# Patient Record
Sex: Male | Born: 1955 | Race: White | Hispanic: No | Marital: Married | Smoking: Former smoker
Health system: Southern US, Community
[De-identification: ages and names within clinical notes are randomized; demographics above are authoritative.]

## PROBLEM LIST (undated history)

## (undated) DIAGNOSIS — J309 Allergic rhinitis, unspecified: Secondary | ICD-10-CM

## (undated) DIAGNOSIS — R0600 Dyspnea, unspecified: Secondary | ICD-10-CM

## (undated) DIAGNOSIS — D509 Iron deficiency anemia, unspecified: Secondary | ICD-10-CM

## (undated) DIAGNOSIS — N189 Chronic kidney disease, unspecified: Secondary | ICD-10-CM

## (undated) DIAGNOSIS — K219 Gastro-esophageal reflux disease without esophagitis: Secondary | ICD-10-CM

## (undated) DIAGNOSIS — E039 Hypothyroidism, unspecified: Secondary | ICD-10-CM

## (undated) DIAGNOSIS — R0609 Other forms of dyspnea: Secondary | ICD-10-CM

## (undated) DIAGNOSIS — G4730 Sleep apnea, unspecified: Secondary | ICD-10-CM

## (undated) DIAGNOSIS — K279 Peptic ulcer, site unspecified, unspecified as acute or chronic, without hemorrhage or perforation: Secondary | ICD-10-CM

## (undated) DIAGNOSIS — Z5189 Encounter for other specified aftercare: Secondary | ICD-10-CM

## (undated) DIAGNOSIS — G4733 Obstructive sleep apnea (adult) (pediatric): Secondary | ICD-10-CM

## (undated) DIAGNOSIS — E119 Type 2 diabetes mellitus without complications: Secondary | ICD-10-CM

## (undated) DIAGNOSIS — M1990 Unspecified osteoarthritis, unspecified site: Secondary | ICD-10-CM

## (undated) DIAGNOSIS — E785 Hyperlipidemia, unspecified: Secondary | ICD-10-CM

## (undated) DIAGNOSIS — Z8719 Personal history of other diseases of the digestive system: Secondary | ICD-10-CM

## (undated) DIAGNOSIS — I10 Essential (primary) hypertension: Secondary | ICD-10-CM

## (undated) DIAGNOSIS — H269 Unspecified cataract: Secondary | ICD-10-CM

## (undated) HISTORY — DX: Type 2 diabetes mellitus without complications: E11.9

## (undated) HISTORY — DX: Hyperlipidemia, unspecified: E78.5

## (undated) HISTORY — PX: OTHER SURGICAL HISTORY: SHX169

## (undated) HISTORY — PX: GASTRIC RESTRICTION SURGERY: SHX653

## (undated) HISTORY — DX: Unspecified cataract: H26.9

## (undated) HISTORY — DX: Sleep apnea, unspecified: G47.30

## (undated) HISTORY — DX: Chronic kidney disease, unspecified: N18.9

## (undated) HISTORY — DX: Dyspnea, unspecified: R06.00

## (undated) HISTORY — DX: Peptic ulcer, site unspecified, unspecified as acute or chronic, without hemorrhage or perforation: K27.9

## (undated) HISTORY — DX: Encounter for other specified aftercare: Z51.89

## (undated) HISTORY — DX: Obstructive sleep apnea (adult) (pediatric): G47.33

## (undated) HISTORY — DX: Unspecified osteoarthritis, unspecified site: M19.90

## (undated) HISTORY — DX: Iron deficiency anemia, unspecified: D50.9

## (undated) HISTORY — DX: Essential (primary) hypertension: I10

## (undated) HISTORY — DX: Allergic rhinitis, unspecified: J30.9

## (undated) HISTORY — PX: COLONOSCOPY: SHX174

## (undated) HISTORY — DX: Other forms of dyspnea: R06.09

---

## 1981-07-18 HISTORY — PX: GASTRIC BYPASS: SHX52

## 1996-07-18 HISTORY — PX: CHOLECYSTECTOMY: SHX55

## 1998-02-04 ENCOUNTER — Encounter: Admission: RE | Admit: 1998-02-04 | Discharge: 1998-05-05 | Payer: Self-pay | Admitting: Cardiology

## 2003-06-26 ENCOUNTER — Encounter: Admission: RE | Admit: 2003-06-26 | Discharge: 2003-06-26 | Payer: Self-pay | Admitting: Internal Medicine

## 2003-07-06 ENCOUNTER — Ambulatory Visit (HOSPITAL_BASED_OUTPATIENT_CLINIC_OR_DEPARTMENT_OTHER): Admission: RE | Admit: 2003-07-06 | Discharge: 2003-07-06 | Payer: Self-pay | Admitting: Interventional Cardiology

## 2003-10-17 ENCOUNTER — Ambulatory Visit (HOSPITAL_COMMUNITY): Admission: RE | Admit: 2003-10-17 | Discharge: 2003-10-17 | Payer: Self-pay | Admitting: Neurosurgery

## 2003-10-30 ENCOUNTER — Ambulatory Visit (HOSPITAL_COMMUNITY): Admission: RE | Admit: 2003-10-30 | Discharge: 2003-10-30 | Payer: Self-pay | Admitting: Neurosurgery

## 2008-08-11 ENCOUNTER — Encounter: Payer: Self-pay | Admitting: Gastroenterology

## 2008-08-18 ENCOUNTER — Encounter: Payer: Self-pay | Admitting: Gastroenterology

## 2008-08-20 ENCOUNTER — Ambulatory Visit: Payer: Self-pay | Admitting: Gastroenterology

## 2008-08-20 DIAGNOSIS — K921 Melena: Secondary | ICD-10-CM

## 2008-08-20 DIAGNOSIS — D500 Iron deficiency anemia secondary to blood loss (chronic): Secondary | ICD-10-CM

## 2008-08-20 DIAGNOSIS — K219 Gastro-esophageal reflux disease without esophagitis: Secondary | ICD-10-CM | POA: Insufficient documentation

## 2008-08-25 ENCOUNTER — Telehealth: Payer: Self-pay | Admitting: Gastroenterology

## 2008-09-29 ENCOUNTER — Encounter: Payer: Self-pay | Admitting: Gastroenterology

## 2008-09-29 ENCOUNTER — Ambulatory Visit (HOSPITAL_COMMUNITY): Admission: RE | Admit: 2008-09-29 | Discharge: 2008-09-29 | Payer: Self-pay | Admitting: Gastroenterology

## 2008-09-29 ENCOUNTER — Ambulatory Visit: Payer: Self-pay | Admitting: Gastroenterology

## 2008-09-29 ENCOUNTER — Telehealth: Payer: Self-pay | Admitting: Gastroenterology

## 2008-09-29 DIAGNOSIS — K449 Diaphragmatic hernia without obstruction or gangrene: Secondary | ICD-10-CM | POA: Insufficient documentation

## 2008-09-30 ENCOUNTER — Encounter: Payer: Self-pay | Admitting: Gastroenterology

## 2008-10-03 ENCOUNTER — Encounter: Admission: RE | Admit: 2008-10-03 | Discharge: 2008-10-03 | Payer: Self-pay | Admitting: Neurosurgery

## 2008-10-09 ENCOUNTER — Telehealth: Payer: Self-pay | Admitting: Gastroenterology

## 2008-11-13 ENCOUNTER — Inpatient Hospital Stay (HOSPITAL_COMMUNITY): Admission: RE | Admit: 2008-11-13 | Discharge: 2008-11-14 | Payer: Self-pay | Admitting: General Surgery

## 2008-11-15 HISTORY — PX: BACK SURGERY: SHX140

## 2010-08-08 ENCOUNTER — Encounter: Payer: Self-pay | Admitting: Neurosurgery

## 2010-10-27 LAB — GLUCOSE, CAPILLARY
Glucose-Capillary: 128 mg/dL — ABNORMAL HIGH (ref 70–99)
Glucose-Capillary: 175 mg/dL — ABNORMAL HIGH (ref 70–99)
Glucose-Capillary: 204 mg/dL — ABNORMAL HIGH (ref 70–99)
Glucose-Capillary: 213 mg/dL — ABNORMAL HIGH (ref 70–99)
Glucose-Capillary: 259 mg/dL — ABNORMAL HIGH (ref 70–99)
Glucose-Capillary: 316 mg/dL — ABNORMAL HIGH (ref 70–99)

## 2010-10-27 LAB — BASIC METABOLIC PANEL
BUN: 21 mg/dL (ref 6–23)
CO2: 27 mEq/L (ref 19–32)
Calcium: 9.3 mg/dL (ref 8.4–10.5)
Chloride: 106 mEq/L (ref 96–112)
Creatinine, Ser: 1.63 mg/dL — ABNORMAL HIGH (ref 0.4–1.5)
GFR calc Af Amer: 54 mL/min — ABNORMAL LOW (ref >60)
GFR calc non Af Amer: 44 mL/min — ABNORMAL LOW (ref >60)
Glucose, Bld: 156 mg/dL — ABNORMAL HIGH (ref 70–99)
Potassium: 4.5 mEq/L (ref 3.5–5.1)
Sodium: 138 mEq/L (ref 135–145)

## 2010-10-27 LAB — CBC
HCT: 36 % — ABNORMAL LOW (ref 39.0–52.0)
Hemoglobin: 11.7 g/dL — ABNORMAL LOW (ref 13.0–17.0)
MCHC: 32.4 g/dL (ref 30.0–36.0)
MCV: 78.2 fL (ref 78.0–100.0)
Platelets: 192 10*3/uL (ref 150–400)
RBC: 4.61 MIL/uL (ref 4.22–5.81)
RDW: 17.1 % — ABNORMAL HIGH (ref 11.5–15.5)
WBC: 6.8 10*3/uL (ref 4.0–10.5)

## 2010-10-28 LAB — GLUCOSE, CAPILLARY: Glucose-Capillary: 117 mg/dL — ABNORMAL HIGH (ref 70–99)

## 2010-11-21 ENCOUNTER — Emergency Department (HOSPITAL_COMMUNITY)
Admission: EM | Admit: 2010-11-21 | Discharge: 2010-11-21 | Disposition: A | Discharge status: Home / Self Care | Payer: PRIVATE HEALTH INSURANCE | Attending: Emergency Medicine | Admitting: Emergency Medicine

## 2010-11-21 DIAGNOSIS — W11XXXA Fall on and from ladder, initial encounter: Secondary | ICD-10-CM | POA: Insufficient documentation

## 2010-11-21 DIAGNOSIS — E039 Hypothyroidism, unspecified: Secondary | ICD-10-CM | POA: Insufficient documentation

## 2010-11-21 DIAGNOSIS — Z043 Encounter for examination and observation following other accident: Secondary | ICD-10-CM | POA: Insufficient documentation

## 2010-11-21 DIAGNOSIS — E119 Type 2 diabetes mellitus without complications: Secondary | ICD-10-CM | POA: Insufficient documentation

## 2010-11-21 DIAGNOSIS — I10 Essential (primary) hypertension: Secondary | ICD-10-CM | POA: Insufficient documentation

## 2010-11-30 NOTE — Op Note (Signed)
Evan Berry              ACCOUNT NO.:  [id number]   MEDICAL RECORD NO.:  [id number]          PATIENT TYPE:  INP   LOCATION:  3111                         FACILITY:  [facility]   PHYSICIAN:  Danae Orleans. Venetia Maxon, M.D.  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   POSTOPERATIVE DIAGNOSES:  Herniated lumbar disk at L3-4 with  spondylosis, degenerative disk disease, radiculopathy, and morbid  obesity.   PROCEDURE:  Right L3-4 microdiskectomy with microdissection.   SURGEON:  Danae Orleans. Venetia Maxon, MD   ASSISTANT:  1. Georgiann Cocker, RN  2. Hilda Lias, MD   ANESTHESIA:  General endotracheal anesthesia.   ESTIMATED BLOOD LOSS:  400 mL.   COMPLICATIONS:  None.   DISPOSITION:  Recovery.   INDICATIONS:  Evan Berry is a 55 year old morbidly obese man  (greater than 400 pounds) with a free fragment of disk herniation at L3-  4 on the right with significant right L4 radiculopathy and low back  pain.  It was elected to take him to the Surgery for microdiskectomy to  the affected level.   PROCEDURE:  Evan Berry was brought to the operating room.  Following  satisfactory and uncomplicated induction of general endotracheal  anesthesia and placement of intravenous lines, the patient was placed in  a prone position on the Wilson frame.  His back was prepped and draped  in the usual sterile fashion.  Prior to draping, an 18 gauge spinal  needle was inserted until what was felt to be the L3-4 level and  intraoperative x-rays were obtained.  Subsequently, area of planned of  incision was marked, infiltrated with local lidocaine and incision was  made and carried through approximately 6 inches of adipose tissue to the  lumbodorsal fascia which was incised in the right  side of midline.  Subperiosteal dissection was performed exposing the interlaminar space  of the L3-4 level and self-retaining Versatrac retractor with 100-mm  blades were placed to facilitate exposure.  Intraoperative x-ray  confirmed correct orientation at the L3-4 level on the right.  Subsequently, a hemi-semi-laminectomy of L3 was performed and  foraminotomy was performed overlying the superior aspect of L4.  Microscope was brought into field using microdissection technique.  The  thecal sac was mobilized medially.  The L4 nerve root was identified.  Microdissection was used to mobilize the L4 nerve root and a herniated  disk was identified with multiple fragments of free disk material were  removed.  The epidural veins were cauterized with bipolar  electrocautery.  The L3-4 interspace space itself was inspected and  found to be quite spondylitic.  The nerve root was felt to be well  decompressed as was the L3 nerve root.  Medial aspect of the canal was  also decompressed and not appear to have any residual compression.  Hemostasis was assured with Gelfoam soaked in thrombin.  The operative  site was bathed in Depo-Medrol and fentanyl.  The self-retaining  retractor was removed.  The lumbodorsal fascia was closed with 0 Vicryl  sutures, subcutaneous tissues were reapproximated with 2-0 Vicryl  interrupted inverted sutures, and skin edges were reapproximated with  interrupted 3-0 Vicryl subcuticular stitch.  Wound was dressed with  Dermabond.  The patient was extubated in the operating room and taken to  the recovery room in stable satisfactory condition having tolerated the  operation well.  Counts were correct at the end of the case.      Danae Orleans. Venetia Maxon, M.D.  Electronically Signed     JDS/MEDQ  D:  11/13/2008  T:  11/14/2008  Job:  [id number]

## 2010-12-30 ENCOUNTER — Other Ambulatory Visit: Payer: Self-pay | Admitting: Neurosurgery

## 2010-12-30 DIAGNOSIS — M5126 Other intervertebral disc displacement, lumbar region: Secondary | ICD-10-CM

## 2011-01-07 ENCOUNTER — Other Ambulatory Visit: Payer: Self-pay

## 2011-01-08 ENCOUNTER — Ambulatory Visit (HOSPITAL_BASED_OUTPATIENT_CLINIC_OR_DEPARTMENT_OTHER)
Admission: RE | Admit: 2011-01-08 | Discharge: 2011-01-08 | Disposition: A | Discharge status: Home / Self Care | Payer: PRIVATE HEALTH INSURANCE | Source: Ambulatory Visit | Attending: Neurosurgery | Admitting: Neurosurgery

## 2011-01-08 ENCOUNTER — Ambulatory Visit (INDEPENDENT_AMBULATORY_CARE_PROVIDER_SITE_OTHER)
Admission: RE | Admit: 2011-01-08 | Discharge: 2011-01-08 | Disposition: A | Discharge status: Home / Self Care | Payer: PRIVATE HEALTH INSURANCE | Source: Ambulatory Visit | Attending: Neurosurgery | Admitting: Neurosurgery

## 2011-01-08 DIAGNOSIS — M25559 Pain in unspecified hip: Secondary | ICD-10-CM

## 2011-01-08 DIAGNOSIS — M5137 Other intervertebral disc degeneration, lumbosacral region: Secondary | ICD-10-CM

## 2011-01-08 DIAGNOSIS — M549 Dorsalgia, unspecified: Secondary | ICD-10-CM

## 2011-01-08 DIAGNOSIS — M545 Low back pain, unspecified: Secondary | ICD-10-CM | POA: Insufficient documentation

## 2011-01-08 DIAGNOSIS — M5126 Other intervertebral disc displacement, lumbar region: Secondary | ICD-10-CM | POA: Insufficient documentation

## 2011-01-08 DIAGNOSIS — M519 Unspecified thoracic, thoracolumbar and lumbosacral intervertebral disc disorder: Secondary | ICD-10-CM | POA: Insufficient documentation

## 2011-01-08 DIAGNOSIS — M51379 Other intervertebral disc degeneration, lumbosacral region without mention of lumbar back pain or lower extremity pain: Secondary | ICD-10-CM | POA: Insufficient documentation

## 2011-01-08 DIAGNOSIS — R209 Unspecified disturbances of skin sensation: Secondary | ICD-10-CM

## 2011-01-08 MED ORDER — GADOBENATE DIMEGLUMINE 529 MG/ML IV SOLN
10.0000 mL | Freq: Once | INTRAVENOUS | Status: AC | PRN
  Administered 2011-01-08: 10 mL via INTRAVENOUS

## 2011-01-20 ENCOUNTER — Other Ambulatory Visit (HOSPITAL_COMMUNITY): Payer: Self-pay | Admitting: Neurosurgery

## 2011-01-20 ENCOUNTER — Encounter (HOSPITAL_COMMUNITY)
Admission: RE | Admit: 2011-01-20 | Discharge: 2011-01-20 | Disposition: A | Discharge status: Home / Self Care | Payer: 59 | Source: Ambulatory Visit | Attending: Neurosurgery | Admitting: Neurosurgery

## 2011-01-20 ENCOUNTER — Ambulatory Visit (HOSPITAL_COMMUNITY)
Admission: RE | Admit: 2011-01-20 | Discharge: 2011-01-20 | Disposition: A | Discharge status: Home / Self Care | Payer: 59 | Source: Ambulatory Visit | Attending: Neurosurgery | Admitting: Neurosurgery

## 2011-01-20 DIAGNOSIS — M5126 Other intervertebral disc displacement, lumbar region: Secondary | ICD-10-CM

## 2011-01-20 DIAGNOSIS — Z01812 Encounter for preprocedural laboratory examination: Secondary | ICD-10-CM | POA: Insufficient documentation

## 2011-01-20 DIAGNOSIS — Z01811 Encounter for preprocedural respiratory examination: Secondary | ICD-10-CM | POA: Insufficient documentation

## 2011-01-20 LAB — CBC
HCT: 38.6 % — ABNORMAL LOW (ref 39.0–52.0)
Hemoglobin: 12.3 g/dL — ABNORMAL LOW (ref 13.0–17.0)
MCH: 27.4 pg (ref 26.0–34.0)
RBC: 4.49 MIL/uL (ref 4.22–5.81)

## 2011-01-20 LAB — BASIC METABOLIC PANEL
BUN: 26 mg/dL — ABNORMAL HIGH (ref 6–23)
CO2: 30 mEq/L (ref 19–32)
Calcium: 9.4 mg/dL (ref 8.4–10.5)
Glucose, Bld: 186 mg/dL — ABNORMAL HIGH (ref 70–99)
Potassium: 4.7 mEq/L (ref 3.5–5.1)
Sodium: 141 mEq/L (ref 135–145)

## 2011-01-20 LAB — TYPE AND SCREEN: Antibody Screen: NEGATIVE

## 2011-01-20 LAB — SURGICAL PCR SCREEN: Staphylococcus aureus: NEGATIVE

## 2011-01-20 LAB — ABO/RH: ABO/RH(D): O POS

## 2011-01-25 ENCOUNTER — Inpatient Hospital Stay (HOSPITAL_COMMUNITY): Payer: 59

## 2011-01-25 ENCOUNTER — Inpatient Hospital Stay (HOSPITAL_COMMUNITY)
Admission: RE | Admit: 2011-01-25 | Discharge: 2011-01-28 | DRG: 460 | Disposition: A | Discharge status: Home Health | Payer: 59 | Source: Ambulatory Visit | Attending: Neurosurgery | Admitting: Neurosurgery

## 2011-01-25 DIAGNOSIS — M51379 Other intervertebral disc degeneration, lumbosacral region without mention of lumbar back pain or lower extremity pain: Secondary | ICD-10-CM | POA: Diagnosis present

## 2011-01-25 DIAGNOSIS — M5126 Other intervertebral disc displacement, lumbar region: Principal | ICD-10-CM | POA: Diagnosis present

## 2011-01-25 DIAGNOSIS — M47817 Spondylosis without myelopathy or radiculopathy, lumbosacral region: Secondary | ICD-10-CM | POA: Diagnosis present

## 2011-01-25 DIAGNOSIS — M5137 Other intervertebral disc degeneration, lumbosacral region: Secondary | ICD-10-CM | POA: Diagnosis present

## 2011-01-25 LAB — GLUCOSE, CAPILLARY: Glucose-Capillary: 193 mg/dL — ABNORMAL HIGH (ref 70–99)

## 2011-01-26 LAB — GLUCOSE, CAPILLARY
Glucose-Capillary: 164 mg/dL — ABNORMAL HIGH (ref 70–99)
Glucose-Capillary: 161 mg/dL — ABNORMAL HIGH (ref 70–99)
Glucose-Capillary: 160 mg/dL — ABNORMAL HIGH (ref 70–99)

## 2011-01-26 NOTE — Op Note (Signed)
NAMEDAVIS, AMBROSINI              ACCOUNT NO.:  [id number]  MEDICAL RECORD NO.:  [id number]  LOCATION:  3114                         FACILITY:  [facility]  PHYSICIAN:  Danae Orleans. Venetia Maxon, M.D.  DATE OF BIRTH:  [birth year]  DATE OF PROCEDURE:  01/25/2011 DATE OF DISCHARGE:                              OPERATIVE REPORT   PREOPERATIVE DIAGNOSES:  Recurrent herniated lumbar disk L3-4 right with spondylosis degenerative disk disease, radiculopathy and morbid obesity with diabetes.  POSTOPERATIVE DIAGNOSES:  Recurrent herniated lumbar disk L3-4 right with spondylosis degenerative disk disease, radiculopathy and morbid obesity with diabetes.  PROCEDURES: 1. Redo laminectomy and diskectomy L3-4 right. 2. Transforaminal lumbar interbody fusion with 9-mm PEEK interbody     cage with morselized bone autograft PureGen and ProFuse 3. Pedicle screw fixation L3-L4 bilaterally. 4. Posterolateral arthrodesis L3-L4 level.  SURGEON:  Danae Orleans. Venetia Maxon, MD  ASSISTANT:  Georgiann Cocker, RN and Hewitt Shorts, MD  ANESTHESIA:  General endotracheal anesthesia.  ESTIMATED BLOOD LOSS:  400 mL with 100 mL Cell Saver blood returned to the patient.  COMPLICATIONS:  None.  DISPOSITION:  Recovery.  INDICATIONS:  Evan Berry is a 55 year old man who is morbidly obese. He has had a gastric bypass and has diabetes mellitus.  He had a microdiskectomy in 2010 at the L3-4 on the right and now has developed a large recurrent disk herniation with significant degeneration at this level.  Because of the patient's large body habitus and recurrent disk rupture, it was elected to perform a decompression and fusion at the L3- 4 level.  PROCEDURE IN DETAILS:  Mr. Gallier was brought to the operating room. Following satisfactory uncomplicated induction of general endotracheal anesthesia and placement of intravenous lines and Foley catheter, the patient was placed in a prone position on the Hibernia frame.   Soft tissue and bony prominences were padded appropriately and care was taken to pad, protect the skin against the side to the frame with foam pads. His low back was shaved, then prepped and draped in usual sterile fashion.  The area of planned incision was infiltrated with local lidocaine.  Incision was made in the midline, carried to approximately 8 inches into the lumbodorsal fascia which was incised bilaterally. Subperiosteal dissection was performed exposing the L3 and L4 transverse processes.  A 120-mm retractor blades were used to facilitate exposure. An intraoperative x-ray confirmed marker probes at the L3 and L4 transverse processes.  A right sided laminectomy of L3 was then performed with high-speed drill and Kerrison rongeurs and a facetectomy was performed at this level, both of the inferior facet of L3 and the superior facet of L4 to decompress the lateral aspect of the thecal sac, the L4 and L3 nerve root.  Using extra long instruments, thorough diskectomy, and preparation of the endplates was performed and this contralateral distraction was placed with a laminar spreader.  After trial sizing, it was elected using 9-mm TLIF cage which was inserted within the interspace and countersunk appropriately after approximately 4 mL of bone autograft which had been morselized with the bone mill was placed in the interspace and countersunk and tamped in position.  The additional bone autograft was placed overlying the cage  and tamped into position as well.  Prior to placing the cages, thorough diskectomy had been performed.  There was significant amount of caudally migrated material within the spinal canal which was causing significant compression of the L4 nerve root.  After placing the cage and interbody grafts, pedicle screws were placed using 6.5 x 15 mm screws 2 at L3, 2 at L4.  All screws had excellent purchase.  The position was confirmed on AP and lateral fluoroscopy.  The  40-mm preloaded rods were fixed to the screw heads.  Prior to doing so, the posterolateral region including intralaminar space as well as the intertransverse space between the L3 and L4 was decorticated with high-speed drill on the left and then 10 mL of Vitoss foam along with bone autograft was placed in this region.  The 40-mm rods were then locked down in situ.  Prior to placing final bone graft, the wound was extensively irrigated.  The self-retaining retractor was removed.  The lumbodorsal fascia was closed with 1 Vicryl sutures, subcutaneous tissues were reapproximated with 1 and 2-0 Vicryl sutures.  Skin edges were approximated with 3-0 Vicryl subcuticular stitch.  The wound was dressed with Benzoin, Steri-Strips, Telfa gauze and tape.  The patient was extubated in the operating room and taken to recovery in stable satisfactory condition having tolerated the operation well.  Counts were correct at the end of case.     Danae Orleans. Venetia Maxon, M.D.     JDS/MEDQ  D:  01/25/2011  T:  01/26/2011  Job:  [id number]  Electronically Signed by Maeola Harman M.D. on 01/26/2011 01:01:31 PM

## 2011-01-27 LAB — GLUCOSE, CAPILLARY
Glucose-Capillary: 183 mg/dL — ABNORMAL HIGH (ref 70–99)
Glucose-Capillary: 149 mg/dL — ABNORMAL HIGH (ref 70–99)
Glucose-Capillary: 201 mg/dL — ABNORMAL HIGH (ref 70–99)
Glucose-Capillary: 225 mg/dL — ABNORMAL HIGH (ref 70–99)
Glucose-Capillary: 289 mg/dL — ABNORMAL HIGH (ref 70–99)

## 2011-01-28 LAB — GLUCOSE, CAPILLARY
Glucose-Capillary: 220 mg/dL — ABNORMAL HIGH (ref 70–99)
Glucose-Capillary: 229 mg/dL — ABNORMAL HIGH (ref 70–99)

## 2011-03-01 NOTE — Discharge Summary (Signed)
  Evan Berry, Evan Berry              ACCOUNT NO.:  [id number]  MEDICAL RECORD NO.:  [id number]  LOCATION:  3018                         FACILITY:  [facility]  PHYSICIAN:  Danae Orleans. Venetia Maxon, M.D.  DATE OF BIRTH:  07-11-1956  DATE OF ADMISSION:  01/25/2011 DATE OF DISCHARGE:  01/28/2011                              DISCHARGE SUMMARY   REASON FOR ADMISSION:  Recurrent disk herniation L3-4 right with morbid obesity and lumbar spondylosis.  He additionally has diabetes mellitus and is status post gastric bypass.  FINAL DIAGNOSES:  Recurrent disk herniation L3-4 right with morbid obesity and lumbar spondylosis.  He additionally has diabetes mellitus and is status post gastric bypass.  HISTORY OF ILLNESS AND HOSPITAL COURSE:  Evan Berry is a 55 year old man with morbid obesity, status post gastric bypass, diabetes.  He had previous right L3-4 diskectomy in 2010.  He subsequently has a large recurrent disk herniation with significant spinal stenosis.  It was elected to take him to surgery for a redo decompression with fusion operation.  The patient did well with that.  He was observed in the ICU postoperatively because of his preoperative medical issues including obstructive sleep apnea.  He did well.  After initial observation was gradually mobilized with physical therapy, was wearing a back brace, and was discharged home in stable satisfactory condition having tolerating his operation well.  DISCHARGE MEDICATIONS:  Preoperative medications along with Percocet. Preoperative medications include: 1. Glyburide/metformin 2.5/500 two tablets twice daily. 2. Vitamin B12. 3. Fish oil. 4. Actos. 5. Fenofibrate 160 mg daily. 6. Multivitamin. 7. Aspirin enteric-coated. 8. Omeprazole 20 mg daily. 9. Pravachol 20 mg daily. 10.Levoxyl 150 mcg daily. 11.Triamterene/hydrochlorothiazide 37.5/25 mg daily. 12.Ramipril 2.5 mg daily. 13.Carvedilol 6.25 mg twice daily.  DISCHARGE INSTRUCTIONS:  Wear  back brace when up, gradually mobilize at home.  Follow up in the office 3 weeks postoperatively with lumbar radiographs at that time.     Danae Orleans. Venetia Maxon, M.D.     JDS/MEDQ  D:  02/23/2011  T:  02/24/2011  Job:  [id number]  Electronically Signed by Maeola Harman M.D. on 03/01/2011 02:14:29 PM

## 2011-05-23 ENCOUNTER — Telehealth: Payer: Self-pay | Admitting: Pulmonary Disease

## 2011-05-23 NOTE — Telephone Encounter (Signed)
Received 2 pages from [facility]; forwarded to Dr. Shelle Iron for review. 05/23/11-ar

## 2011-05-24 ENCOUNTER — Ambulatory Visit: Payer: 59 | Attending: Neurosurgery

## 2011-05-24 DIAGNOSIS — R262 Difficulty in walking, not elsewhere classified: Secondary | ICD-10-CM | POA: Insufficient documentation

## 2011-05-24 DIAGNOSIS — M545 Low back pain, unspecified: Secondary | ICD-10-CM | POA: Insufficient documentation

## 2011-05-24 DIAGNOSIS — IMO0001 Reserved for inherently not codable concepts without codable children: Secondary | ICD-10-CM | POA: Insufficient documentation

## 2011-05-24 DIAGNOSIS — R5381 Other malaise: Secondary | ICD-10-CM | POA: Insufficient documentation

## 2011-05-27 ENCOUNTER — Ambulatory Visit: Payer: 59

## 2011-05-30 ENCOUNTER — Ambulatory Visit: Payer: 59

## 2011-06-01 ENCOUNTER — Ambulatory Visit: Payer: 59 | Admitting: Physical Therapy

## 2011-06-06 ENCOUNTER — Ambulatory Visit: Payer: 59

## 2011-06-08 ENCOUNTER — Ambulatory Visit: Payer: 59 | Admitting: Physical Therapy

## 2011-06-13 ENCOUNTER — Ambulatory Visit: Payer: 59 | Admitting: Physical Therapy

## 2011-06-15 ENCOUNTER — Ambulatory Visit: Payer: 59 | Admitting: Physical Therapy

## 2011-06-20 ENCOUNTER — Ambulatory Visit: Payer: 59 | Attending: Neurosurgery

## 2011-06-20 DIAGNOSIS — R262 Difficulty in walking, not elsewhere classified: Secondary | ICD-10-CM | POA: Insufficient documentation

## 2011-06-20 DIAGNOSIS — M545 Low back pain, unspecified: Secondary | ICD-10-CM | POA: Insufficient documentation

## 2011-06-20 DIAGNOSIS — R5381 Other malaise: Secondary | ICD-10-CM | POA: Insufficient documentation

## 2011-06-20 DIAGNOSIS — IMO0001 Reserved for inherently not codable concepts without codable children: Secondary | ICD-10-CM | POA: Insufficient documentation

## 2011-06-21 ENCOUNTER — Encounter: Payer: Self-pay | Admitting: *Deleted

## 2011-06-22 ENCOUNTER — Ambulatory Visit (INDEPENDENT_AMBULATORY_CARE_PROVIDER_SITE_OTHER): Payer: 59 | Admitting: Pulmonary Disease

## 2011-06-22 ENCOUNTER — Encounter: Payer: Self-pay | Admitting: Pulmonary Disease

## 2011-06-22 ENCOUNTER — Ambulatory Visit: Payer: 59 | Admitting: Physical Therapy

## 2011-06-22 VITALS — BP 130/76 | HR 94 | Temp 98.1°F | Ht 73.0 in | Wt >= 6400 oz

## 2011-06-22 DIAGNOSIS — G4733 Obstructive sleep apnea (adult) (pediatric): Secondary | ICD-10-CM

## 2011-06-22 NOTE — Assessment & Plan Note (Signed)
The patient has a history of moderate sleep apnea with intolerance to CPAP, however he is now gained 50 pounds since that time and has become more symptomatic.  I have reviewed the pathophysiology of sleep apnea with him, including its impact on his cardiovascular health and quality of life.  I suspect his sleep apnea is much worse than his prior study, and have strongly urged him to try CPAP again.  He is willing to do this, and I will work with him on improving comfort and tolerance.  I have also encouraged him to work aggressively on weight loss, and have reminded him of his moral obligation to not drive if he is sleepy. I will set the patient up on cpap at a moderate pressure level to allow for desensitization, and will troubleshoot the device over the next 4-6weeks if needed.  The pt is to call me if having issues with tolerance.  Will then optimize the pressure once patient is able to wear cpap on a consistent basis.

## 2011-06-22 NOTE — Progress Notes (Signed)
  Subjective:    Patient ID: Evan Berry, male    DOB: 04-Sep-1955, 55 y.o.   MRN: [id number]  HPI The patient is a 55 year old male who I've been asked to see for management of obstructive sleep apnea.  The patient was diagnosed in 2004 with moderate OSA, with an AHI of 23 events per hour.  He was started on CPAP, however was unable to wear due to "feeling confined".  The patient quit using CPAP, but was started on nocturnal oxygen by his primary care physician in order to prevent pulmonary hypertension.  The patient has gained 50 pounds since his last sleep study, and is having more sleeping issues as well as shortness of breath during the day.  He has been noted to have loud snoring as well as an abnormal breathing pattern during sleep.  He has frequent awakenings during the night, and nonrestorative sleep upon arising.  The patient notes significant daytime sleepiness with any period of inactivity, and can basically fall asleep anytime he sits down if not stimulated.  He also has sleepiness with driving short or longer distances.  Sleep Questionnaire: What time do you typically go to bed?( Between what hours) 10 pm How long does it take you to fall asleep? 2 to 3 mins How many times during the night do you wake up? 10 What time do you get out of bed to start your day? 0300 Do you drive or operate heavy machinery in your occupation? Yes How much has your weight changed (up or down) over the past two years? (In pounds) 30 lb (13.608 kg) Have you ever had a sleep study before? Yes If yes, location of study? [facility] If yes, date of study? 2004 Do you currently use CPAP? No Do you wear oxygen at any time? Yes O2 Flow Rate (L/min) 2 L/min    Review of Systems  Constitutional: Negative for fever and unexpected weight change.  HENT: Positive for congestion. Negative for ear pain, nosebleeds, sore throat, rhinorrhea, sneezing, trouble swallowing, dental problem, postnasal drip and sinus pressure.   Eyes:  Negative for redness and itching.  Respiratory: Positive for shortness of breath. Negative for cough, chest tightness and wheezing.   Cardiovascular: Positive for leg swelling. Negative for palpitations.  Gastrointestinal: Negative for nausea and vomiting.  Genitourinary: Negative for dysuria.  Musculoskeletal: Positive for joint swelling.  Skin: Negative for rash.  Neurological: Negative for headaches.  Hematological: Does not bruise/bleed easily.  Psychiatric/Behavioral: Negative for dysphoric mood. The patient is not nervous/anxious.        Objective:   Physical Exam Constitutional: morbidly obese, no acute distress  HENT:  Nares patent without discharge, deviated septum to right with narrowing.  Oropharynx without exudate, palate and uvula are thickened and elongated.   Eyes:  Perrla, eomi, no scleral icterus  Neck:  No JVD, no TMG  Cardiovascular:  Normal rate, regular rhythm, no rubs or gallops.  No murmurs        Intact distal pulses  Pulmonary :  Normal breath sounds, no stridor or respiratory distress   No rales, rhonchi, or wheezing  Abdominal:  Soft, nondistended, bowel sounds present.  No tenderness noted.   Musculoskeletal:  1+ lower extremity edema noted.  Lymph Nodes:  No cervical lymphadenopathy noted  Skin:  No cyanosis noted  Neurologic:  Alert, appropriate, moves all 4 extremities without obvious deficit.         Assessment & Plan:

## 2011-06-22 NOTE — Patient Instructions (Signed)
Will restart on cpap at a moderate pressure level.  Will optimize pressure after next visit.   Please call if having issues with tolerance of cpap Work on weight loss followup with me in 5 weeks.

## 2011-06-29 ENCOUNTER — Ambulatory Visit: Payer: 59 | Admitting: Physical Therapy

## 2011-07-04 ENCOUNTER — Ambulatory Visit: Payer: 59

## 2011-07-06 ENCOUNTER — Ambulatory Visit: Payer: 59 | Admitting: Physical Therapy

## 2011-07-13 ENCOUNTER — Telehealth: Payer: Self-pay | Admitting: Pulmonary Disease

## 2011-07-13 NOTE — Telephone Encounter (Signed)
Patient says he doesn't feel like his pressure is strong enough and needs to be adjusted. Per last OV note, Kc planned on having pressure optimized after his next OV on 07/27/11. Pt also states he has called [facility] to see when he can come by to have a mask fitting but has not heard back from anyone. His mask is not fitting properly and leaks and this could be a contributing factor to him not sleeping well. He did not want [place] to call [facility] and said he will wait on a callback from them. I will close this msg but will forward to [facility] so he is aware.

## 2011-07-18 ENCOUNTER — Ambulatory Visit: Payer: 59

## 2011-07-18 NOTE — Telephone Encounter (Signed)
Will send this note to [facility] to address.  He needs to get a fitting mask that doesn't leak.

## 2011-07-20 ENCOUNTER — Ambulatory Visit: Payer: 59 | Attending: Neurosurgery | Admitting: Physical Therapy

## 2011-07-20 DIAGNOSIS — M545 Low back pain, unspecified: Secondary | ICD-10-CM | POA: Insufficient documentation

## 2011-07-20 DIAGNOSIS — R262 Difficulty in walking, not elsewhere classified: Secondary | ICD-10-CM | POA: Insufficient documentation

## 2011-07-20 DIAGNOSIS — IMO0001 Reserved for inherently not codable concepts without codable children: Secondary | ICD-10-CM | POA: Insufficient documentation

## 2011-07-20 DIAGNOSIS — R5381 Other malaise: Secondary | ICD-10-CM | POA: Insufficient documentation

## 2011-07-25 ENCOUNTER — Ambulatory Visit: Payer: 59

## 2011-07-27 ENCOUNTER — Encounter: Payer: Self-pay | Admitting: Pulmonary Disease

## 2011-07-27 ENCOUNTER — Ambulatory Visit (INDEPENDENT_AMBULATORY_CARE_PROVIDER_SITE_OTHER): Payer: 59 | Admitting: Pulmonary Disease

## 2011-07-27 ENCOUNTER — Encounter: Payer: Self-pay | Admitting: Physical Therapy

## 2011-07-27 VITALS — BP 140/60 | HR 71 | Temp 97.9°F | Ht 73.0 in | Wt >= 6400 oz

## 2011-07-27 DIAGNOSIS — G4733 Obstructive sleep apnea (adult) (pediatric): Secondary | ICD-10-CM

## 2011-07-27 NOTE — Assessment & Plan Note (Signed)
The patient is doing okay with CPAP, but feels that he needs more pressure.  Will optimize his pressure on the automatic setting for the next few weeks, and then determine his pressure once I receive the download.  I have asked him to work aggressively on weight loss, and also to keep up with mask changes and supplies.  He will followup with me in 6 months, but is to call if he has issues with CPAP tolerance.

## 2011-07-27 NOTE — Progress Notes (Signed)
  Subjective:    Patient ID: Evan Berry, male    DOB: [birth year], 56 y.o.   MRN: [id number]  HPI Patient comes in today for followup of his known obstructive sleep apnea.  He was started on CPAP at the last visit, and has been wearing at least 4 hours a night compliantly.  He had to change to a different level facemask, but is very comfortable with his current one.  He feels that he is sleeping better, but continues to have back pain issues which disrupts his sleep.  He does feel that he needs more pressure.   Review of Systems  Constitutional: Negative for fever and unexpected weight change.  HENT: Positive for congestion and sneezing. Negative for ear pain, nosebleeds, sore throat, rhinorrhea, trouble swallowing, dental problem, postnasal drip and sinus pressure.   Eyes: Negative for redness and itching.  Respiratory: Positive for cough, shortness of breath and wheezing. Negative for chest tightness.   Cardiovascular: Positive for leg swelling. Negative for palpitations.  Gastrointestinal: Negative for nausea and vomiting.  Genitourinary: Negative for dysuria.  Musculoskeletal: Negative for joint swelling.  Skin: Negative for rash.  Neurological: Negative for headaches.  Hematological: Does not bruise/bleed easily.  Psychiatric/Behavioral: Negative for dysphoric mood. The patient is not nervous/anxious.        Objective:   Physical Exam Obese male in no acute distress No skin breakdown or pressure necrosis from the CPAP mask Lower extremities with edema noted, no cyanosis Awake, but does appear mildly sleepy, moves all 4 extremities.       Assessment & Plan:

## 2011-07-27 NOTE — Patient Instructions (Signed)
Will optimize pressure for you on the auto setting for a few weeks, and will let you know your set pressure once we receive the report. Work on weight loss If doing well, followup with me in 6mos.  Call if having cpap issues.

## 2011-08-01 ENCOUNTER — Ambulatory Visit: Payer: 59

## 2011-08-08 ENCOUNTER — Ambulatory Visit: Payer: 59

## 2011-08-15 ENCOUNTER — Encounter: Payer: Self-pay | Admitting: Physical Therapy

## 2011-08-22 ENCOUNTER — Encounter: Payer: Self-pay | Admitting: Physical Therapy

## 2011-09-11 ENCOUNTER — Other Ambulatory Visit: Payer: Self-pay | Admitting: Pulmonary Disease

## 2011-09-11 DIAGNOSIS — G4733 Obstructive sleep apnea (adult) (pediatric): Secondary | ICD-10-CM

## 2011-11-28 ENCOUNTER — Telehealth: Payer: Self-pay | Admitting: Pulmonary Disease

## 2011-11-28 DIAGNOSIS — G4733 Obstructive sleep apnea (adult) (pediatric): Secondary | ICD-10-CM

## 2011-11-28 NOTE — Telephone Encounter (Signed)
Ok to go back to dme for refit

## 2011-11-28 NOTE — Telephone Encounter (Signed)
I spoke with pt and he states his cpap mask is too large for his face. He states he adjusts the straps and the piece across his forehead. It also bothers his eyes. Pt is requesting an order be sent to his [facility] for a mask refit. He was just seen in January and had recently had a mask refit and felt it was doing better but now the mask is not doing well for him. Please advise KC, thanks

## 2011-11-28 NOTE — Telephone Encounter (Signed)
Order has been sent and nothing further was needed

## 2012-01-25 ENCOUNTER — Ambulatory Visit: Payer: Self-pay | Admitting: Pulmonary Disease

## 2012-02-02 ENCOUNTER — Encounter: Payer: Self-pay | Admitting: Pulmonary Disease

## 2012-02-02 ENCOUNTER — Ambulatory Visit (INDEPENDENT_AMBULATORY_CARE_PROVIDER_SITE_OTHER): Payer: 59 | Admitting: Pulmonary Disease

## 2012-02-02 VITALS — BP 122/62 | HR 75 | Temp 98.4°F | Ht 73.0 in | Wt >= 6400 oz

## 2012-02-02 DIAGNOSIS — G4733 Obstructive sleep apnea (adult) (pediatric): Secondary | ICD-10-CM

## 2012-02-02 NOTE — Progress Notes (Signed)
  Subjective:    Patient ID: Evan Berry, male    DOB: [birth year], 56 y.o.   MRN: [id number]  HPI Patient comes in today for followup of his known obstructive sleep apnea.  He is wearing CPAP compliantly, and his pressure has been optimized.  He feels that his sleep is much improved, and he is no longer having daytime sleepiness.  He is having some mask leak at times, but is working with his DME company on a better fit.   Review of Systems  Constitutional: Negative for fever and unexpected weight change.  HENT: Positive for congestion and sneezing. Negative for ear pain, nosebleeds, sore throat, rhinorrhea, trouble swallowing, dental problem, postnasal drip and sinus pressure.   Eyes: Negative for redness and itching.  Respiratory: Positive for shortness of breath and wheezing. Negative for cough and chest tightness.   Cardiovascular: Positive for leg swelling. Negative for palpitations.  Gastrointestinal: Negative for nausea and vomiting.  Genitourinary: Negative for dysuria.  Musculoskeletal: Positive for arthralgias. Negative for joint swelling.  Skin: Negative for rash.  Neurological: Negative for headaches.  Hematological: Does not bruise/bleed easily.  Psychiatric/Behavioral: Negative for dysphoric mood. The patient is not nervous/anxious.   All other systems reviewed and are negative.       Objective:   Physical Exam Morbidly obese male in no acute distress Nose without purulence or discharge noted No skin breakdown or pressure necrosis from the CPAP mask Mild lower extremity edema, no cyanosis Alert and oriented, moves all 4 extremities.  Does not appear to be sleepy.       Assessment & Plan:

## 2012-02-02 NOTE — Assessment & Plan Note (Signed)
The patient is doing better with CPAP at his optimal pressure, and feels that his alertness during the day is much improved.  Encouraged him to continue working on a better mask fit, and work aggressively on weight loss.  If he continues to have issues with mask leaking, I would recommend a formal fitting at the sleep Center.  If doing well, he will followup in 12 months.

## 2012-02-02 NOTE — Patient Instructions (Addendum)
Stay on cpap at current level If you continue to have issues with mask leaks, let me know, and we can set up a fitting at the sleep center. Work on weight loss followup with me in 12mos.

## 2012-09-24 IMAGING — CR DG HIP W/ PELVIS BILAT
5 series · 5 of 5 positions shown · non-contrast
Comparison: None.

CLINICAL DATA: Bilateral hip pain

BILATERAL HIP WITH PELVIS - 4+ VIEW

[t pelvis a.p. *]
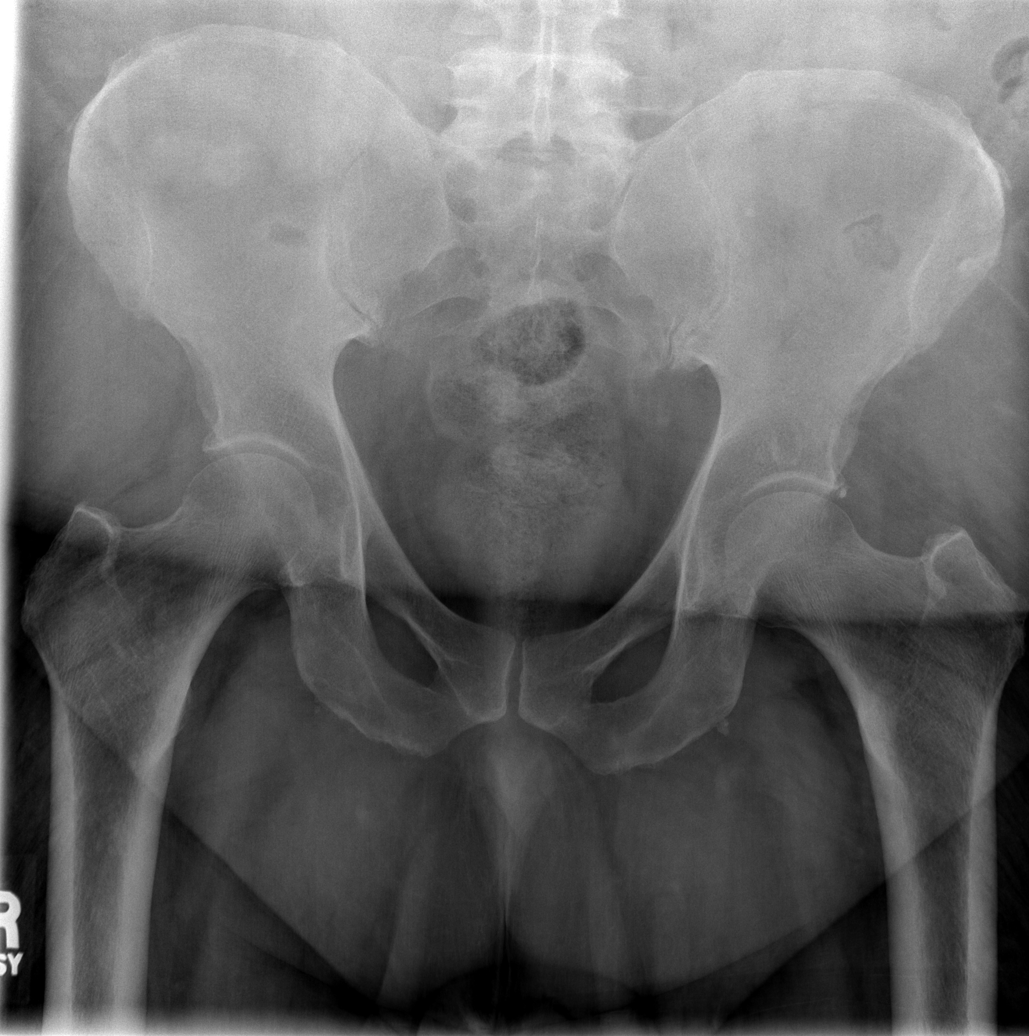

[t hip frog leg left]
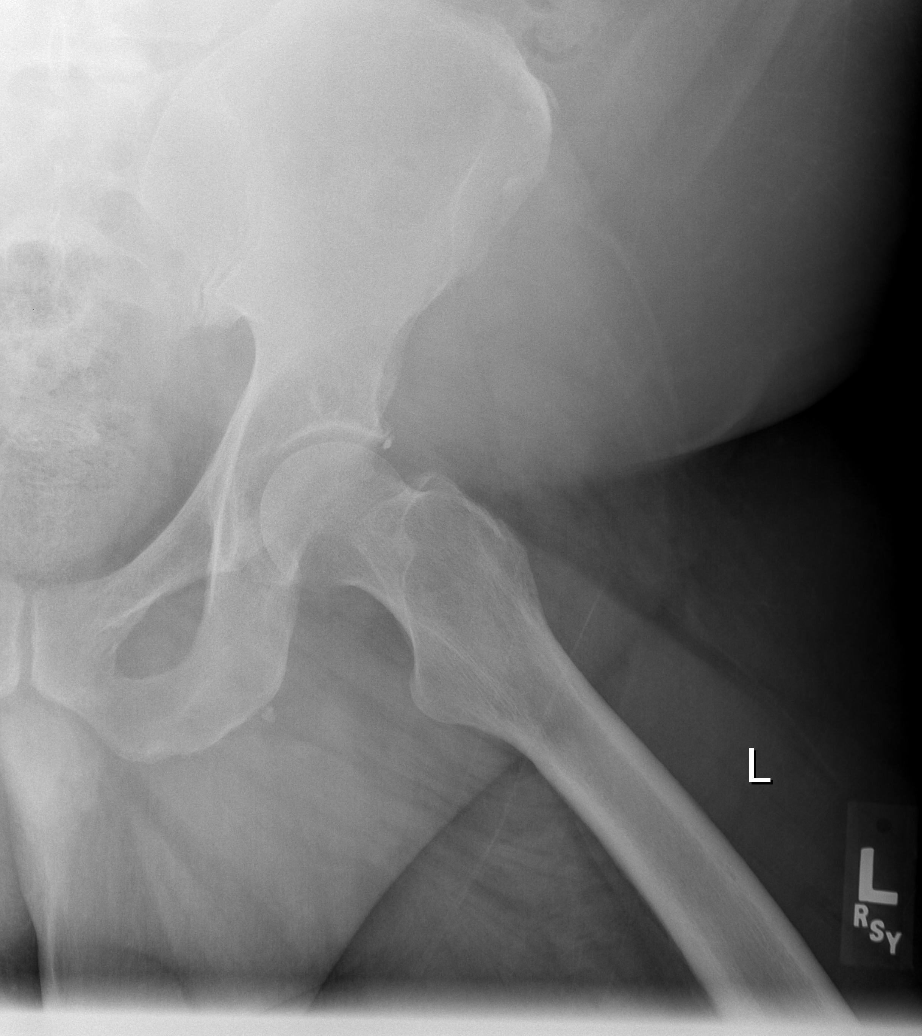

[t hip ap right (1 of 2)]
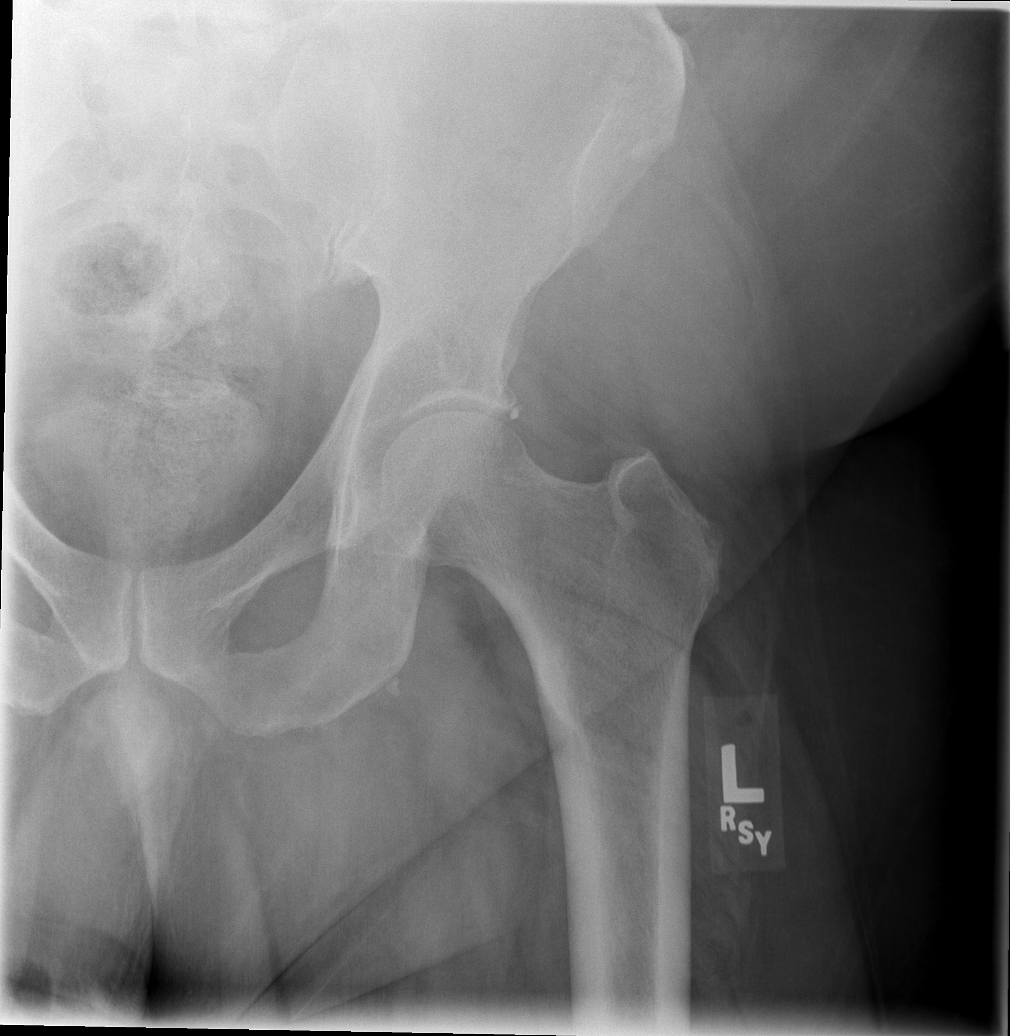

[t hip frog leg right]
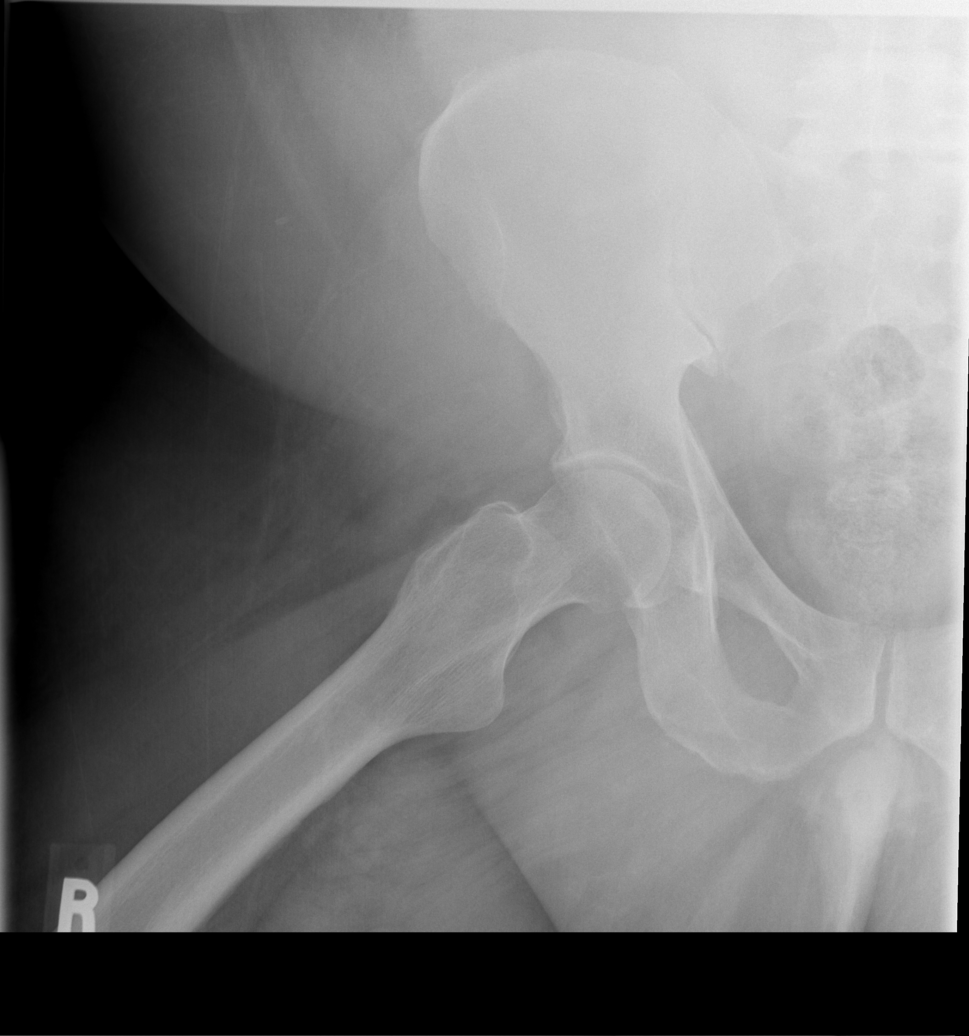

[t hip ap right (2 of 2)]
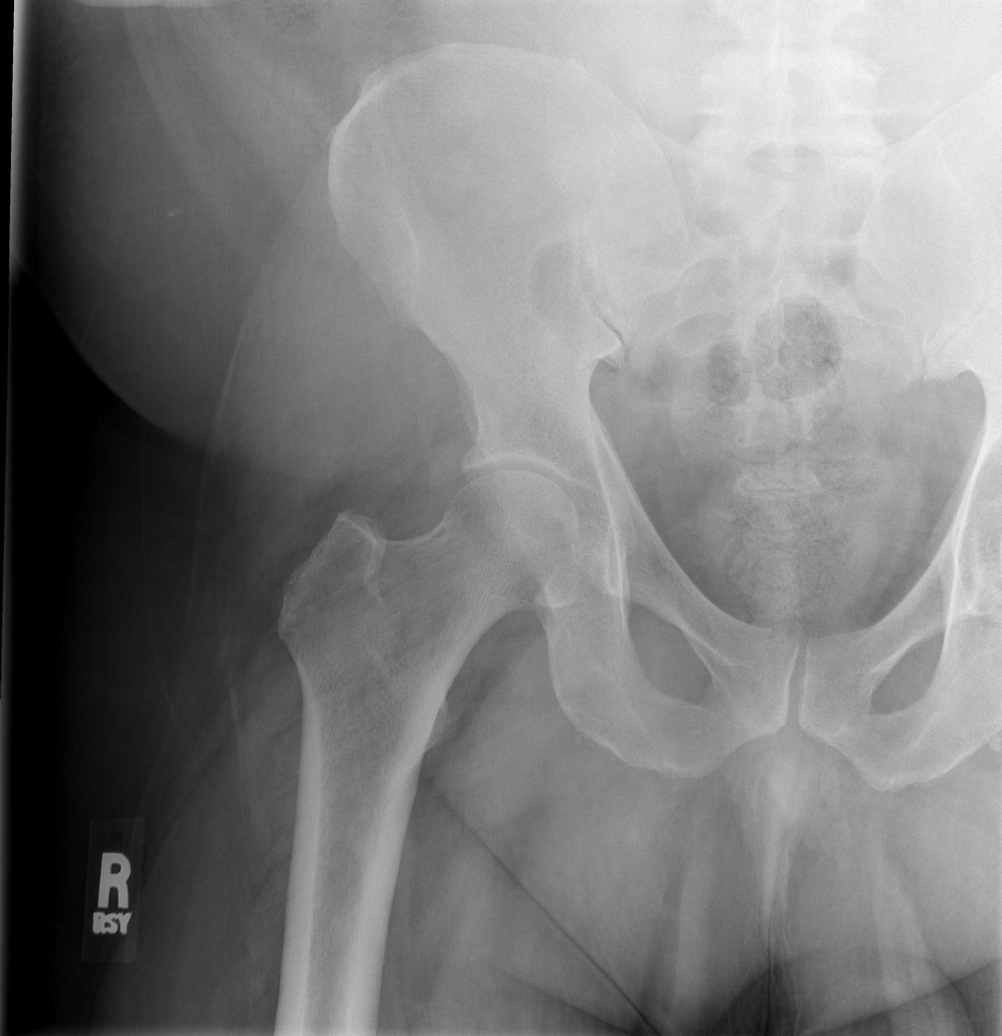

[5 of 5 positions shown; findings below may reference images not displayed]

FINDINGS: Frontal pelvis shows no evidence for fracture.  No
worrisome lytic or sclerotic osseous abnormality.  SI joints and
symphysis pubis are normal.  Joint space in the hips is well
preserved and symmetric.  No substantial osteoarthritis in either
hip.

AP and frog-leg lateral views of the left hip show no worrisome
abnormality.

AP and frog-leg lateral views of the right hip are normal.
IMPRESSION: No evidence to explain the patient's history of bilateral hip pain.

## 2013-02-01 ENCOUNTER — Ambulatory Visit (INDEPENDENT_AMBULATORY_CARE_PROVIDER_SITE_OTHER): Payer: 59 | Admitting: Pulmonary Disease

## 2013-02-01 ENCOUNTER — Encounter: Payer: Self-pay | Admitting: Pulmonary Disease

## 2013-02-01 VITALS — BP 122/82 | HR 45 | Temp 98.0°F | Ht 73.0 in | Wt 357.8 lb

## 2013-02-01 DIAGNOSIS — G4733 Obstructive sleep apnea (adult) (pediatric): Secondary | ICD-10-CM

## 2013-02-01 NOTE — Patient Instructions (Addendum)
Will optimize your pressure again on the automatic setting.  Will call you with the results. You need to get a new mask that fits better.  Will send the order to your medical equipment company. Keep working on weight loss.  You are doing fantastic. followup with me in one year, but call if having issues with sleep apnea.

## 2013-02-01 NOTE — Progress Notes (Signed)
  Subjective:    Patient ID: Evan Berry, male    DOB: 08-06-1955, 58 y.o.   MRN: [id number]  HPI The patient comes in today for followup of his obstructive sleep apnea.  He has lost 95 pounds since last visit, and I have commended him on this.  He is wearing CPAP compliantly, but is having issues with mask fitting, but I suspect is secondary to his significant weight loss.  He is having no issues with CPAP tolerance, and feels that he sleeps well with the device other than the mask fit.   Review of Systems  Constitutional: Negative for fever and unexpected weight change.  HENT: Positive for congestion. Negative for ear pain, nosebleeds, sore throat, rhinorrhea, sneezing, trouble swallowing, dental problem, postnasal drip and sinus pressure.   Eyes: Negative for redness and itching.  Respiratory: Negative for cough, chest tightness, shortness of breath and wheezing.   Cardiovascular: Negative for palpitations and leg swelling.  Gastrointestinal: Negative for nausea and vomiting.  Genitourinary: Negative for dysuria.  Musculoskeletal: Negative for joint swelling.  Skin: Negative for rash.  Neurological: Negative for headaches.  Hematological: Does not bruise/bleed easily.  Psychiatric/Behavioral: Negative for dysphoric mood. The patient is not nervous/anxious.        Objective:   Physical Exam Overweight male in no acute distress Nose without purulence or discharge noted No skin breakdown or pressure necrosis from the CPAP mask Neck without lymphadenopathy or thyromegaly Lower extremities with mild edema, no cyanosis Alert, does not appear to be sleepy, moves all 4 extremities.       Assessment & Plan:

## 2013-02-01 NOTE — Assessment & Plan Note (Signed)
The patient is doing very well with CPAP, and has lost 95 pounds since last visit.  His only complaint today is that of mask fit issues, and I suspect is related to his significant weight loss.  I think he needs to be refitted for a mask, and we also need to optimize his pressure again.  I have encouraged him to continue working on weight loss.

## 2013-03-25 ENCOUNTER — Encounter: Payer: Self-pay | Admitting: Pulmonary Disease

## 2013-03-25 ENCOUNTER — Other Ambulatory Visit: Payer: Self-pay | Admitting: Pulmonary Disease

## 2013-03-25 DIAGNOSIS — G4733 Obstructive sleep apnea (adult) (pediatric): Secondary | ICD-10-CM

## 2013-08-07 ENCOUNTER — Encounter: Payer: Self-pay | Admitting: Gastroenterology

## 2014-02-03 ENCOUNTER — Telehealth: Payer: Self-pay | Admitting: Pulmonary Disease

## 2014-02-03 ENCOUNTER — Ambulatory Visit (INDEPENDENT_AMBULATORY_CARE_PROVIDER_SITE_OTHER): Payer: Commercial Managed Care - HMO | Admitting: Pulmonary Disease

## 2014-02-03 ENCOUNTER — Encounter: Payer: Self-pay | Admitting: Pulmonary Disease

## 2014-02-03 VITALS — BP 120/70 | HR 47 | Temp 98.1°F | Ht 73.0 in | Wt 259.8 lb

## 2014-02-03 DIAGNOSIS — G4733 Obstructive sleep apnea (adult) (pediatric): Secondary | ICD-10-CM

## 2014-02-03 NOTE — Assessment & Plan Note (Signed)
The patient has done extremely well on CPAP and has lost over 100 pounds since the last visit. I suspect the patient's mask does not fit properly because of his significant weight loss, and we'll need to look at a different type. I also suspect that his pressure is too high given his weight loss, and we'll set him on the automatic setting to help with this. I think if he is able to lose another 30-40pounds, he will not need to CPAP.

## 2014-02-03 NOTE — Progress Notes (Signed)
   Subjective:    Patient ID: Evan Berry, male    DOB: [birth year], 58 y.o.   MRN: [id number]  HPI The patient comes in today for followup of his obstructive sleep apnea. He is wearing CPAP compliantly, but is complaining about his mask fit and also the pressure. It should be noted he has lost 100 pounds since last visit.   Review of Systems  Constitutional: Negative for fever and unexpected weight change.  HENT: Negative for congestion, dental problem, ear pain, nosebleeds, postnasal drip, rhinorrhea, sinus pressure, sneezing, sore throat and trouble swallowing.   Eyes: Negative for redness and itching.  Respiratory: Negative for cough, chest tightness, shortness of breath and wheezing.   Cardiovascular: Negative for palpitations and leg swelling.  Gastrointestinal: Negative for nausea and vomiting.  Genitourinary: Negative for dysuria.  Musculoskeletal: Negative for joint swelling.  Skin: Negative for rash.  Neurological: Negative for headaches.  Hematological: Does not bruise/bleed easily.  Psychiatric/Behavioral: Negative for dysphoric mood. The patient is not nervous/anxious.        Objective:   Physical Exam Overweight male in no acute distress Nose without purulence or discharge noted Neck without lymphadenopathy or thyromegaly No skin breakdown or pressure across his CPAP Alert and oriented, moves all 4 extremities. Lower extremities without edema, no cyanosis       Assessment & Plan:

## 2014-02-03 NOTE — Patient Instructions (Signed)
Will have your cpap machine put on auto.  This should help with your pressure. Will send order to your home care company to get a new mask since you have lost a lot of weight  followup with me in one year, but call if you lose another 40 pounds.

## 2014-02-03 NOTE — Telephone Encounter (Signed)
This is a caller from PCP office. Will need to call back in AM as they have closed at this time.

## 2014-02-04 NOTE — Telephone Encounter (Signed)
Called spoke with [place]. She reports she spoke with [place] yesterday and everything was taken care of. Nothing further needed

## 2014-02-04 NOTE — Telephone Encounter (Signed)
ATC, office does not open until 8:30am. Yaphank Bing, CMA

## 2014-02-10 ENCOUNTER — Telehealth: Payer: Self-pay | Admitting: Pulmonary Disease

## 2014-02-10 NOTE — Telephone Encounter (Signed)
Per order 02/03/14; Pt needs to look at different cpap masks since he has lost so much weight Set cpap machine on auto 5-15cm --  Spoke with Arbie Cookey from [facility]. She reports they never received this order. Gave VO. Nothing further needed

## 2015-02-09 ENCOUNTER — Ambulatory Visit: Payer: Self-pay | Admitting: Pulmonary Disease

## 2015-02-19 ENCOUNTER — Other Ambulatory Visit: Payer: Self-pay | Admitting: Pulmonary Disease

## 2015-02-19 DIAGNOSIS — G4733 Obstructive sleep apnea (adult) (pediatric): Secondary | ICD-10-CM

## 2015-02-23 ENCOUNTER — Encounter: Payer: Self-pay | Admitting: Pulmonary Disease

## 2015-02-23 ENCOUNTER — Ambulatory Visit (INDEPENDENT_AMBULATORY_CARE_PROVIDER_SITE_OTHER): Payer: PPO | Admitting: Pulmonary Disease

## 2015-02-23 VITALS — BP 140/80 | HR 55 | Ht 73.0 in | Wt 273.0 lb

## 2015-02-23 DIAGNOSIS — G4733 Obstructive sleep apnea (adult) (pediatric): Secondary | ICD-10-CM | POA: Diagnosis not present

## 2015-02-23 NOTE — Addendum Note (Signed)
Addended by: Mathis Dad on: 02/23/2015 04:46 PM   Modules accepted: Orders

## 2015-02-23 NOTE — Assessment & Plan Note (Signed)
We will set you up with a new DME & supplies CPAP download will be checked If you lose more weight, may consider repeating sleep study next visit  Weight loss encouraged, compliance with goal of at least 4-6 hrs every night is the expectation. Advised against medications with sedative side effects Cautioned against driving when sleepy - understanding that sleepiness will vary on a day to day basis

## 2015-02-23 NOTE — Patient Instructions (Signed)
We will set you up with a new DME & supplies CPAP download will be checked If you lose more weight, may consider repeating sleep study next visit

## 2015-02-23 NOTE — Progress Notes (Signed)
   Subjective:    Patient ID: Evan Berry, male    DOB: 08/04/1955, 59 y.o.   MRN: [id number]  HPI  Chief Complaint  Patient presents with  . Sleep Apnea    Former [facility] pt; pt has new insurance and can no longer use [facility]. Needs to update supplies.  no download available.   59 y.o with mod OSA  Has gained 15 lbs - from 259 to 273  Admits to not using CPAP on occasion when he travels Insurance changed & needs new DME. Mask ok - old one, pr ok   NPSG 2004:  AHI 23/hr, cpap titrated to 8cm Auto 2013: optimal pressure 14cm Auto 2014:  Optimal pressure 12cm.   Review of Systems neg for any significant sore throat, dysphagia, itching, sneezing, nasal congestion or excess/ purulent secretions, fever, chills, sweats, unintended wt loss, pleuritic or exertional cp, hempoptysis, orthopnea pnd or change in chronic leg swelling. Also denies presyncope, palpitations, heartburn, abdominal pain, nausea, vomiting, diarrhea or change in bowel or urinary habits, dysuria,hematuria, rash, arthralgias, visual complaints, headache, numbness weakness or ataxia.      Objective:   Physical Exam  Gen. Pleasant, obese, in no distress ENT - no lesions, no post nasal drip Neck: No JVD, no thyromegaly, no carotid bruits Lungs: no use of accessory muscles, no dullness to percussion, decreased without rales or rhonchi  Cardiovascular: Rhythm regular, heart sounds  normal, no murmurs or gallops, no peripheral edema Musculoskeletal: No deformities, no cyanosis or clubbing , no tremors       Assessment & Plan:

## 2015-06-22 ENCOUNTER — Encounter: Payer: Self-pay | Admitting: Gastroenterology

## 2015-08-25 ENCOUNTER — Ambulatory Visit: Payer: Self-pay | Admitting: Gastroenterology

## 2015-10-19 ENCOUNTER — Encounter: Payer: Self-pay | Admitting: Gastroenterology

## 2015-10-19 ENCOUNTER — Ambulatory Visit (INDEPENDENT_AMBULATORY_CARE_PROVIDER_SITE_OTHER): Payer: PPO | Admitting: Gastroenterology

## 2015-10-19 VITALS — BP 120/80 | HR 68 | Ht 73.0 in | Wt 306.6 lb

## 2015-10-19 DIAGNOSIS — R195 Other fecal abnormalities: Secondary | ICD-10-CM

## 2015-10-19 MED ORDER — NA SULFATE-K SULFATE-MG SULF 17.5-3.13-1.6 GM/177ML PO SOLN: 1.0000 | Freq: Once | ORAL | Status: DC

## 2015-10-19 NOTE — Progress Notes (Signed)
    History of Present Illness: This is a 60 year old male referred by Evan Infante, MD for the evaluation of Hemosure positive stool. He underwent colonoscopy and EGD in 09/2008. Colonoscopy was normal except for internal hemorrhoids and the EGD revealed a hiatal hernia and an irregular Z line. Biopsies showed findings consistent with GERD. No evidence of Barrett's. Patient was recommended to undergo an upper GI series but this was not completed. Patient had surgery for a bleeding ulcer approximately 10 years ago. I do not have records available from that surgery. Patient has no gastrointestinal complaints. Recent CBC was unremarkable except for a platelet count of 118,000. Chemistry panel and TSH were normal. Denies weight loss, abdominal pain, constipation, diarrhea, change in stool caliber, melena, hematochezia, nausea, vomiting, dysphagia, reflux symptoms, chest pain.  Review of Systems: Pertinent positive and negative review of systems were noted in the above HPI section. All other review of systems were otherwise negative.  Current Medications, Allergies, Past Medical History, Past Surgical History, Family History and Social History were reviewed in [employer] record.  Physical Exam: General: Well developed, well nourished, obese, no acute distress Head: Normocephalic and atraumatic Eyes:  sclerae anicteric, EOMI Ears: Normal auditory acuity Mouth: No deformity or lesions Neck: Supple, no masses or thyromegaly Lungs: Clear throughout to auscultation Heart: Regular rate and rhythm; no murmurs, rubs or bruits Abdomen: Soft, non tender and non distended. No masses, hepatosplenomegaly or hernias noted. Normal Bowel sounds Rectal: deferred to colonoscopy Musculoskeletal: Symmetrical with no gross deformities  Skin: No lesions on visible extremities Pulses:  Normal pulses noted Extremities: No clubbing, cyanosis, edema or deformities noted Neurological: Alert oriented x  4, grossly nonfocal Cervical Nodes:  No significant cervical adenopathy Inguinal Nodes: No significant inguinal adenopathy Psychological:  Alert and cooperative. Normal mood and affect  Assessment and Recommendations:  1. Occult blood in stool. Rule out colorectal neoplasms. The risks (including bleeding, perforation, infection, missed lesions, medication reactions and possible hospitalization or surgery if complications occur), benefits, and alternatives to colonoscopy with possible biopsy and possible polypectomy were discussed with the patient and they consent to proceed.    cc: Evan Infante, MD [address] [place],  [address]

## 2015-10-19 NOTE — Patient Instructions (Signed)
You have been scheduled for a colonoscopy. Please follow written instructions given to you at your visit today.  Please pick up your prep supplies at the pharmacy within the next 1-3 days. If you use inhalers (even only as needed), please bring them with you on the day of your procedure. Your physician has requested that you go to www.startemmi.com and enter the access code given to you at your visit today. This web site gives a general overview about your procedure. However, you should still follow specific instructions given to you by our office regarding your preparation for the procedure.  Thank you for choosing me and [facility].  Malcolm T. Stark, Jr., MD., FACG  

## 2015-10-20 ENCOUNTER — Telehealth: Payer: Self-pay | Admitting: Gastroenterology

## 2015-10-20 NOTE — Telephone Encounter (Signed)
Informed patient that we will call him when it gets closer to the date of his procedure to give him a free Suprep sample. Patient verbalized understanding.

## 2015-11-16 DIAGNOSIS — M5416 Radiculopathy, lumbar region: Secondary | ICD-10-CM | POA: Diagnosis not present

## 2015-11-16 DIAGNOSIS — M545 Low back pain: Secondary | ICD-10-CM | POA: Diagnosis not present

## 2015-11-16 DIAGNOSIS — Z6841 Body Mass Index (BMI) 40.0 and over, adult: Secondary | ICD-10-CM | POA: Diagnosis not present

## 2015-11-24 ENCOUNTER — Encounter: Payer: Self-pay | Admitting: Gastroenterology

## 2015-11-24 ENCOUNTER — Telehealth: Payer: Self-pay | Admitting: Gastroenterology

## 2015-11-24 NOTE — Telephone Encounter (Signed)
Patient informed to come by our office to pick up sample. Patient will come by tomorrow.

## 2015-11-25 DIAGNOSIS — E119 Type 2 diabetes mellitus without complications: Secondary | ICD-10-CM | POA: Diagnosis not present

## 2015-11-25 DIAGNOSIS — H35033 Hypertensive retinopathy, bilateral: Secondary | ICD-10-CM | POA: Diagnosis not present

## 2015-11-25 DIAGNOSIS — H2513 Age-related nuclear cataract, bilateral: Secondary | ICD-10-CM | POA: Diagnosis not present

## 2015-11-25 DIAGNOSIS — H35041 Retinal micro-aneurysms, unspecified, right eye: Secondary | ICD-10-CM | POA: Diagnosis not present

## 2015-12-03 ENCOUNTER — Ambulatory Visit (AMBULATORY_SURGERY_CENTER): Payer: PPO | Admitting: Gastroenterology

## 2015-12-03 ENCOUNTER — Encounter: Payer: Self-pay | Admitting: Gastroenterology

## 2015-12-03 VITALS — BP 113/65 | HR 51 | Temp 98.0°F | Resp 14 | Ht 73.0 in | Wt 306.0 lb

## 2015-12-03 DIAGNOSIS — R195 Other fecal abnormalities: Secondary | ICD-10-CM | POA: Diagnosis not present

## 2015-12-03 DIAGNOSIS — Z1211 Encounter for screening for malignant neoplasm of colon: Secondary | ICD-10-CM | POA: Diagnosis not present

## 2015-12-03 MED ORDER — SODIUM CHLORIDE 0.9 % IV SOLN: 500.0000 mL | INTRAVENOUS | Status: DC

## 2015-12-03 NOTE — Patient Instructions (Signed)
YOU HAD AN ENDOSCOPIC PROCEDURE TODAY AT THE [facility]:   Refer to the procedure report that was given to you for any specific questions about what was found during the examination.  If the procedure report does not answer your questions, please call your gastroenterologist to clarify.  If you requested that your care partner not be given the details of your procedure findings, then the procedure report has been included in a sealed envelope for you to review at your convenience later.  YOU SHOULD EXPECT: Some feelings of bloating in the abdomen. Passage of more gas than usual.  Walking can help get rid of the air that was put into your GI tract during the procedure and reduce the bloating. If you had a lower endoscopy (such as a colonoscopy or flexible sigmoidoscopy) you may notice spotting of blood in your stool or on the toilet paper. If you underwent a bowel prep for your procedure, you may not have a normal bowel movement for a few days.  Please Note:  You might notice some irritation and congestion in your nose or some drainage.  This is from the oxygen used during your procedure.  There is no need for concern and it should clear up in a day or so.  SYMPTOMS TO REPORT IMMEDIATELY:   Following lower endoscopy (colonoscopy or flexible sigmoidoscopy):  Excessive amounts of blood in the stool  Significant tenderness or worsening of abdominal pains  Swelling of the abdomen that is new, acute  Fever of 100F or higher  For urgent or emergent issues, a gastroenterologist can be reached at any hour by calling [phone].   DIET: Your first meal following the procedure should be a small meal and then it is ok to progress to your normal diet. Heavy or fried foods are harder to digest and may make you feel nauseous or bloated.  Likewise, meals heavy in dairy and vegetables can increase bloating.  Drink plenty of fluids but you should avoid alcoholic beverages for 24  hours.  ACTIVITY:  You should plan to take it easy for the rest of today and you should NOT DRIVE or use heavy machinery until tomorrow (because of the sedation medicines used during the test).    FOLLOW UP: Our staff will call the number listed on your records the next business day following your procedure to check on you and address any questions or concerns that you may have regarding the information given to you following your procedure. If we do not reach you, we will leave a message.  However, if you are feeling well and you are not experiencing any problems, there is no need to return our call.  We will assume that you have returned to your regular daily activities without incident.  If any biopsies were taken you will be contacted by phone or by letter within the next 1-3 weeks.  Please call us at [phone] if you have not heard about the biopsies in 3 weeks.    SIGNATURES/CONFIDENTIALITY: You and/or your care partner have signed paperwork which will be entered into your electronic medical record.  These signatures attest to the fact that that the information above on your After Visit Summary has been reviewed and is understood.  Full responsibility of the confidentiality of this discharge information lies with you and/or your care-partner.  Please review hemorrhoid handout provided. 

## 2015-12-03 NOTE — Progress Notes (Signed)
A/ox3 pleased with MAC, report to Wendy RN 

## 2015-12-03 NOTE — Op Note (Signed)
[facility] Patient Name: Evan Berry Procedure Date: 12/03/2015 8:37 AM MRN: [id number] Endoscopist: Ladene Artist , MD Age: 60 Referring MD:  Date of Birth: [birth year] Gender: Male Procedure:                Colonoscopy Indications:              Evaluation of unexplained GI bleeding, Positive                            fecal immunochemical test Medicines:                Monitored Anesthesia Care Procedure:                Pre-Anesthesia Assessment:                           - Prior to the procedure, a History and Physical                            was performed, and patient medications and                            allergies were reviewed. The patient's tolerance of                            previous anesthesia was also reviewed. The risks                            and benefits of the procedure and the sedation                            options and risks were discussed with the patient.                            All questions were answered, and informed consent                            was obtained. Prior Anticoagulants: The patient has                            taken no previous anticoagulant or antiplatelet                            agents. ASA Grade Assessment: II - A patient with                            mild systemic disease. After reviewing the risks                            and benefits, the patient was deemed in                            satisfactory condition to undergo the procedure.  After obtaining informed consent, the colonoscope                            was passed under direct vision. Throughout the                            procedure, the patient's blood pressure, pulse, and                            oxygen saturations were monitored continuously. The                            Model PCF-[id number]) scope was introduced                            through the anus and advanced to the the cecum,                    identified by appendiceal orifice and ileocecal                            valve. The colonoscopy was somewhat difficult due                            to a tortuous colon. Successful completion of the                            procedure was aided by using manual pressure,                            withdrawing and reinserting the scope and                            straightening and shortening the scope to obtain                            bowel loop reduction. The patient tolerated the                            procedure well. The quality of the bowel                            preparation was adequate. The ileocecal valve,                            appendiceal orifice, and rectum were photographed. Scope In: 8:41:15 AM Scope Out: 9:04:58 AM Scope Withdrawal Time: 0 hours 15 minutes 8 seconds  Total Procedure Duration: 0 hours 23 minutes 43 seconds  Findings:                 The digital rectal exam was normal.                           The entire examined colon appeared normal.  Internal hemorrhoids were found during                            retroflexion. The hemorrhoids were small and Grade                            I (internal hemorrhoids that do not prolapse).                           No additional abnormalities were found on                            retroflexion. Complications:            No immediate complications. Estimated Blood Loss:     Estimated blood loss: none. Impression:               - The entire examined colon is normal. Recommendation:           - Patient has a contact number available for                            emergencies. The signs and symptoms of potential                            delayed complications were discussed with the                            patient. Return to normal activities tomorrow.                            Written discharge instructions were provided to the                             patient.                           - Resume previous diet.                           - Continue present medications.                           - Repeat colonoscopy in 10 years for screening                            purposes. Ladene Artist, MD 12/03/2015 9:10:54 AM This report has been signed electronically.

## 2015-12-04 ENCOUNTER — Telehealth: Payer: Self-pay | Admitting: *Deleted

## 2015-12-04 NOTE — Telephone Encounter (Signed)
  Follow up Call-  Call back number 12/03/2015  Post procedure Call Back phone  # [phone]  Permission to leave phone message Yes     Patient questions:  Do you have a fever, pain , or abdominal swelling? No. Pain Score  0 *  Have you tolerated food without any problems? Yes.    Have you been able to return to your normal activities? Yes.    Do you have any questions about your discharge instructions: Diet   No. Medications  No. Follow up visit  No.  Do you have questions or concerns about your Care? No.  Actions: * If pain score is 4 or above: No action needed, pain <4.

## 2015-12-21 DIAGNOSIS — M1711 Unilateral primary osteoarthritis, right knee: Secondary | ICD-10-CM | POA: Diagnosis not present

## 2015-12-21 DIAGNOSIS — M1712 Unilateral primary osteoarthritis, left knee: Secondary | ICD-10-CM | POA: Diagnosis not present

## 2016-01-18 DIAGNOSIS — M25562 Pain in left knee: Secondary | ICD-10-CM | POA: Diagnosis not present

## 2016-01-18 DIAGNOSIS — M1712 Unilateral primary osteoarthritis, left knee: Secondary | ICD-10-CM | POA: Diagnosis not present

## 2016-05-13 DIAGNOSIS — M1712 Unilateral primary osteoarthritis, left knee: Secondary | ICD-10-CM | POA: Diagnosis not present

## 2016-05-13 DIAGNOSIS — M1711 Unilateral primary osteoarthritis, right knee: Secondary | ICD-10-CM | POA: Diagnosis not present

## 2016-05-18 DIAGNOSIS — M545 Low back pain: Secondary | ICD-10-CM | POA: Diagnosis not present

## 2016-05-18 DIAGNOSIS — M5416 Radiculopathy, lumbar region: Secondary | ICD-10-CM | POA: Diagnosis not present

## 2016-06-21 DIAGNOSIS — Z125 Encounter for screening for malignant neoplasm of prostate: Secondary | ICD-10-CM | POA: Diagnosis not present

## 2016-06-21 DIAGNOSIS — E119 Type 2 diabetes mellitus without complications: Secondary | ICD-10-CM | POA: Diagnosis not present

## 2016-06-21 DIAGNOSIS — I10 Essential (primary) hypertension: Secondary | ICD-10-CM | POA: Diagnosis not present

## 2016-06-21 DIAGNOSIS — E038 Other specified hypothyroidism: Secondary | ICD-10-CM | POA: Diagnosis not present

## 2016-06-21 DIAGNOSIS — E784 Other hyperlipidemia: Secondary | ICD-10-CM | POA: Diagnosis not present

## 2016-06-28 DIAGNOSIS — G4733 Obstructive sleep apnea (adult) (pediatric): Secondary | ICD-10-CM | POA: Diagnosis not present

## 2016-06-28 DIAGNOSIS — E1129 Type 2 diabetes mellitus with other diabetic kidney complication: Secondary | ICD-10-CM | POA: Diagnosis not present

## 2016-06-28 DIAGNOSIS — M5380 Other specified dorsopathies, site unspecified: Secondary | ICD-10-CM | POA: Diagnosis not present

## 2016-06-28 DIAGNOSIS — Z0000 Encounter for general adult medical examination without abnormal findings: Secondary | ICD-10-CM | POA: Diagnosis not present

## 2016-06-28 DIAGNOSIS — Z1389 Encounter for screening for other disorder: Secondary | ICD-10-CM | POA: Diagnosis not present

## 2016-06-28 DIAGNOSIS — E784 Other hyperlipidemia: Secondary | ICD-10-CM | POA: Diagnosis not present

## 2016-06-28 DIAGNOSIS — N182 Chronic kidney disease, stage 2 (mild): Secondary | ICD-10-CM | POA: Diagnosis not present

## 2016-06-28 DIAGNOSIS — Z6841 Body Mass Index (BMI) 40.0 and over, adult: Secondary | ICD-10-CM | POA: Diagnosis not present

## 2016-06-28 DIAGNOSIS — E038 Other specified hypothyroidism: Secondary | ICD-10-CM | POA: Diagnosis not present

## 2016-06-28 DIAGNOSIS — I10 Essential (primary) hypertension: Secondary | ICD-10-CM | POA: Diagnosis not present

## 2016-06-28 DIAGNOSIS — D72819 Decreased white blood cell count, unspecified: Secondary | ICD-10-CM | POA: Diagnosis not present

## 2016-10-31 DIAGNOSIS — Z6841 Body Mass Index (BMI) 40.0 and over, adult: Secondary | ICD-10-CM | POA: Diagnosis not present

## 2016-10-31 DIAGNOSIS — E038 Other specified hypothyroidism: Secondary | ICD-10-CM | POA: Diagnosis not present

## 2016-10-31 DIAGNOSIS — M1990 Unspecified osteoarthritis, unspecified site: Secondary | ICD-10-CM | POA: Diagnosis not present

## 2016-10-31 DIAGNOSIS — G4733 Obstructive sleep apnea (adult) (pediatric): Secondary | ICD-10-CM | POA: Diagnosis not present

## 2016-10-31 DIAGNOSIS — E1129 Type 2 diabetes mellitus with other diabetic kidney complication: Secondary | ICD-10-CM | POA: Diagnosis not present

## 2016-10-31 DIAGNOSIS — I10 Essential (primary) hypertension: Secondary | ICD-10-CM | POA: Diagnosis not present

## 2016-11-07 DIAGNOSIS — M1712 Unilateral primary osteoarthritis, left knee: Secondary | ICD-10-CM | POA: Diagnosis not present

## 2016-11-07 DIAGNOSIS — M1711 Unilateral primary osteoarthritis, right knee: Secondary | ICD-10-CM | POA: Diagnosis not present

## 2016-11-23 DIAGNOSIS — M545 Low back pain: Secondary | ICD-10-CM | POA: Diagnosis not present

## 2016-11-23 DIAGNOSIS — M5416 Radiculopathy, lumbar region: Secondary | ICD-10-CM | POA: Diagnosis not present

## 2016-11-23 DIAGNOSIS — I10 Essential (primary) hypertension: Secondary | ICD-10-CM | POA: Diagnosis not present

## 2016-11-23 DIAGNOSIS — Z6841 Body Mass Index (BMI) 40.0 and over, adult: Secondary | ICD-10-CM | POA: Diagnosis not present

## 2016-11-28 DIAGNOSIS — H35033 Hypertensive retinopathy, bilateral: Secondary | ICD-10-CM | POA: Diagnosis not present

## 2016-11-28 DIAGNOSIS — H2513 Age-related nuclear cataract, bilateral: Secondary | ICD-10-CM | POA: Diagnosis not present

## 2016-11-28 DIAGNOSIS — E119 Type 2 diabetes mellitus without complications: Secondary | ICD-10-CM | POA: Diagnosis not present

## 2016-11-28 DIAGNOSIS — H35361 Drusen (degenerative) of macula, right eye: Secondary | ICD-10-CM | POA: Diagnosis not present

## 2016-12-19 DIAGNOSIS — M1711 Unilateral primary osteoarthritis, right knee: Secondary | ICD-10-CM | POA: Diagnosis not present

## 2016-12-19 DIAGNOSIS — M1712 Unilateral primary osteoarthritis, left knee: Secondary | ICD-10-CM | POA: Diagnosis not present

## 2016-12-26 DIAGNOSIS — M1712 Unilateral primary osteoarthritis, left knee: Secondary | ICD-10-CM | POA: Diagnosis not present

## 2016-12-26 DIAGNOSIS — M1711 Unilateral primary osteoarthritis, right knee: Secondary | ICD-10-CM | POA: Diagnosis not present

## 2017-01-02 DIAGNOSIS — M1711 Unilateral primary osteoarthritis, right knee: Secondary | ICD-10-CM | POA: Diagnosis not present

## 2017-01-02 DIAGNOSIS — M1712 Unilateral primary osteoarthritis, left knee: Secondary | ICD-10-CM | POA: Diagnosis not present

## 2017-01-30 DIAGNOSIS — M1711 Unilateral primary osteoarthritis, right knee: Secondary | ICD-10-CM | POA: Diagnosis not present

## 2017-01-30 DIAGNOSIS — M1712 Unilateral primary osteoarthritis, left knee: Secondary | ICD-10-CM | POA: Diagnosis not present

## 2017-03-06 DIAGNOSIS — G4733 Obstructive sleep apnea (adult) (pediatric): Secondary | ICD-10-CM | POA: Diagnosis not present

## 2017-03-06 DIAGNOSIS — E1129 Type 2 diabetes mellitus with other diabetic kidney complication: Secondary | ICD-10-CM | POA: Diagnosis not present

## 2017-03-06 DIAGNOSIS — M1990 Unspecified osteoarthritis, unspecified site: Secondary | ICD-10-CM | POA: Diagnosis not present

## 2017-03-06 DIAGNOSIS — E038 Other specified hypothyroidism: Secondary | ICD-10-CM | POA: Diagnosis not present

## 2017-03-06 DIAGNOSIS — I10 Essential (primary) hypertension: Secondary | ICD-10-CM | POA: Diagnosis not present

## 2017-03-06 DIAGNOSIS — Z6841 Body Mass Index (BMI) 40.0 and over, adult: Secondary | ICD-10-CM | POA: Diagnosis not present

## 2017-03-06 DIAGNOSIS — Z1389 Encounter for screening for other disorder: Secondary | ICD-10-CM | POA: Diagnosis not present

## 2017-05-29 DIAGNOSIS — M5416 Radiculopathy, lumbar region: Secondary | ICD-10-CM | POA: Diagnosis not present

## 2017-05-29 DIAGNOSIS — Z6841 Body Mass Index (BMI) 40.0 and over, adult: Secondary | ICD-10-CM | POA: Diagnosis not present

## 2017-05-29 DIAGNOSIS — I10 Essential (primary) hypertension: Secondary | ICD-10-CM | POA: Diagnosis not present

## 2017-05-29 DIAGNOSIS — M545 Low back pain: Secondary | ICD-10-CM | POA: Diagnosis not present

## 2017-06-16 DIAGNOSIS — I10 Essential (primary) hypertension: Secondary | ICD-10-CM | POA: Diagnosis not present

## 2017-06-16 DIAGNOSIS — E1129 Type 2 diabetes mellitus with other diabetic kidney complication: Secondary | ICD-10-CM | POA: Diagnosis not present

## 2017-06-16 DIAGNOSIS — Z6841 Body Mass Index (BMI) 40.0 and over, adult: Secondary | ICD-10-CM | POA: Diagnosis not present

## 2017-06-16 DIAGNOSIS — G4733 Obstructive sleep apnea (adult) (pediatric): Secondary | ICD-10-CM | POA: Diagnosis not present

## 2017-06-16 DIAGNOSIS — M1990 Unspecified osteoarthritis, unspecified site: Secondary | ICD-10-CM | POA: Diagnosis not present

## 2017-09-26 DIAGNOSIS — Z125 Encounter for screening for malignant neoplasm of prostate: Secondary | ICD-10-CM | POA: Diagnosis not present

## 2017-09-26 DIAGNOSIS — I10 Essential (primary) hypertension: Secondary | ICD-10-CM | POA: Diagnosis not present

## 2017-09-26 DIAGNOSIS — E038 Other specified hypothyroidism: Secondary | ICD-10-CM | POA: Diagnosis not present

## 2017-09-26 DIAGNOSIS — R82998 Other abnormal findings in urine: Secondary | ICD-10-CM | POA: Diagnosis not present

## 2017-09-26 DIAGNOSIS — E7849 Other hyperlipidemia: Secondary | ICD-10-CM | POA: Diagnosis not present

## 2017-09-26 DIAGNOSIS — E1129 Type 2 diabetes mellitus with other diabetic kidney complication: Secondary | ICD-10-CM | POA: Diagnosis not present

## 2017-10-03 DIAGNOSIS — E1129 Type 2 diabetes mellitus with other diabetic kidney complication: Secondary | ICD-10-CM | POA: Diagnosis not present

## 2017-10-03 DIAGNOSIS — B029 Zoster without complications: Secondary | ICD-10-CM | POA: Diagnosis not present

## 2017-10-03 DIAGNOSIS — E7849 Other hyperlipidemia: Secondary | ICD-10-CM | POA: Diagnosis not present

## 2017-10-03 DIAGNOSIS — Z1389 Encounter for screening for other disorder: Secondary | ICD-10-CM | POA: Diagnosis not present

## 2017-10-03 DIAGNOSIS — Z0000 Encounter for general adult medical examination without abnormal findings: Secondary | ICD-10-CM | POA: Diagnosis not present

## 2017-10-03 DIAGNOSIS — R809 Proteinuria, unspecified: Secondary | ICD-10-CM | POA: Diagnosis not present

## 2017-10-03 DIAGNOSIS — I10 Essential (primary) hypertension: Secondary | ICD-10-CM | POA: Diagnosis not present

## 2017-10-03 DIAGNOSIS — G4733 Obstructive sleep apnea (adult) (pediatric): Secondary | ICD-10-CM | POA: Diagnosis not present

## 2017-10-03 DIAGNOSIS — E038 Other specified hypothyroidism: Secondary | ICD-10-CM | POA: Diagnosis not present

## 2017-10-03 DIAGNOSIS — Z6841 Body Mass Index (BMI) 40.0 and over, adult: Secondary | ICD-10-CM | POA: Diagnosis not present

## 2017-10-03 DIAGNOSIS — N182 Chronic kidney disease, stage 2 (mild): Secondary | ICD-10-CM | POA: Diagnosis not present

## 2017-10-04 DIAGNOSIS — Z1212 Encounter for screening for malignant neoplasm of rectum: Secondary | ICD-10-CM | POA: Diagnosis not present

## 2017-10-25 ENCOUNTER — Ambulatory Visit (INDEPENDENT_AMBULATORY_CARE_PROVIDER_SITE_OTHER): Payer: PPO | Admitting: Gastroenterology

## 2017-10-25 ENCOUNTER — Encounter: Payer: Self-pay | Admitting: Gastroenterology

## 2017-10-25 VITALS — BP 160/92 | HR 60 | Ht 73.0 in | Wt 395.4 lb

## 2017-10-25 DIAGNOSIS — K449 Diaphragmatic hernia without obstruction or gangrene: Secondary | ICD-10-CM | POA: Diagnosis not present

## 2017-10-25 DIAGNOSIS — R1310 Dysphagia, unspecified: Secondary | ICD-10-CM

## 2017-10-25 DIAGNOSIS — K219 Gastro-esophageal reflux disease without esophagitis: Secondary | ICD-10-CM | POA: Diagnosis not present

## 2017-10-25 DIAGNOSIS — R195 Other fecal abnormalities: Secondary | ICD-10-CM

## 2017-10-25 MED ORDER — OMEPRAZOLE 20 MG PO CPDR: 20.0000 mg | DELAYED_RELEASE_CAPSULE | Freq: Two times a day (BID) | ORAL | 11 refills | Status: DC

## 2017-10-25 NOTE — Patient Instructions (Signed)
We increased your omeprazole 20 mg tablets to twice a day. We sent a new prescription to your pharmacy.   You have been scheduled for an Upper GI Series at [facility]. Your appointment is on 11/08/17 at 9:30am. Please arrive 15 minutes prior to your test for registration. Make sure not to eat or drink anything after midnight on the night before your test. If you need to reschedule, please call radiology at [phone]. ________________________________________________________________ An upper GI series uses x rays to help diagnose problems of the upper GI tract, which includes the esophagus, stomach, and duodenum. The duodenum is the first part of the small intestine. An upper GI series is conducted by a radiology technologist or a radiologist-a doctor who specializes in x-ray imaging-at a hospital or outpatient center. While sitting or standing in front of an x-ray machine, the patient drinks barium liquid, which is often white and has a chalky consistency and taste. The barium liquid coats the lining of the upper GI tract and makes signs of disease show up more clearly on x rays. X-ray video, called fluoroscopy, is used to view the barium liquid moving through the esophagus, stomach, and duodenum. Additional x rays and fluoroscopy are performed while the patient lies on an x-ray table. To fully coat the upper GI tract with barium liquid, the technologist or radiologist may press on the abdomen or ask the patient to change position. Patients hold still in various positions, allowing the technologist or radiologist to take x rays of the upper GI tract at different angles. If a technologist conducts the upper GI series, a radiologist will later examine the images to look for problems.  This test typically takes about 1 hour to complete. __________________________________________________________________  Dennis Bast have been scheduled for an endoscopy. Please follow written instructions given  to you at your visit today. If you use inhalers (even only as needed), please bring them with you on the day of your procedure.  Normal BMI (Body Mass Index- based on height and weight) is between 19 and 25. Your BMI today is Body mass index is 52.17 kg/m. Marland Kitchen Please consider follow up  regarding your BMI with your Primary Care Provider.  Thank you for choosing me and [facility].  Pricilla Riffle. Dagoberto Ligas., MD., Marval Regal

## 2017-10-25 NOTE — Progress Notes (Signed)
    History of Present Illness: This is a 62 year old male a S/P gastric stapling in 1983.  He relates several months of intermittent nausea and vomiting, occasional regurgitation and intermittent solid food dysphasia.  He has ongoing mild constipation that has not changed.  Last EGD and colonoscopy as below.  He was recommended to have an upper GI series following his 2010 EGD however it that apparently was not performed.  Hemosure test positive in March 2019.  EGD 09/2008 irreg z-line, 5cm HH, biopsies showed inflammation c/w GERD  Colonoscopy 11/2015 internal hemorrhoids, otherwise normal.   Current Medications, Allergies, Past Medical History, Past Surgical History, Family History and Social History were reviewed in [employer] record.  Physical Exam: General: Well developed, well nourished, obese, no acute distress Head: Normocephalic and atraumatic Eyes:  sclerae anicteric, EOMI Ears: Normal auditory acuity Mouth: No deformity or lesions Lungs: Clear throughout to auscultation Heart: Regular rate and rhythm; no murmurs, rubs or bruits Abdomen: Soft, non tender and non distended. No masses, hepatosplenomegaly or hernias noted. Normal Bowel sounds Rectal: not done Musculoskeletal: Symmetrical with no gross deformities  Pulses:  Normal pulses noted Extremities: No clubbing, cyanosis, edema or deformities noted Neurological: Alert oriented x 4, grossly nonfocal Psychological:  Alert and cooperative. Normal mood and affect  Assessment and Recommendations:  1. Dysphagia, regurgitation, GERD.  Rule out esophageal stricture, esophagitis, partial GOO, reduced gastric motility, enlarging hiatal hernia.  Follow antireflux measures.  Increase omeprazole to 20 mg twice daily.  Schedule UGI series and EGD. The risks (including bleeding, perforation, infection, missed lesions, medication reactions and possible hospitalization or surgery if complications occur), benefits, and  alternatives to endoscopy with possible biopsy and possible dilation were discussed with the patient and they consent to proceed.   2.  Hemosure stool test positive in 09/2017 likely due to internal hemorrhoids.  A complete colonoscopy with an adequate bowel prep was performed less than 2 years ago revealing only internal hemorrhoids.  There is little value in annual screening hemosure testing for several years following a complete colonoscopy. No plans to repeat colonoscopy at this time.    3. Morbid obesity, weight=395, BMI=52.   4. OSA

## 2017-11-08 ENCOUNTER — Ambulatory Visit (HOSPITAL_COMMUNITY)
Admission: RE | Admit: 2017-11-08 | Discharge: 2017-11-08 | Disposition: A | Discharge status: Home / Self Care | Payer: PPO | Source: Ambulatory Visit | Attending: Gastroenterology | Admitting: Gastroenterology

## 2017-11-08 DIAGNOSIS — K224 Dyskinesia of esophagus: Secondary | ICD-10-CM | POA: Diagnosis not present

## 2017-11-08 DIAGNOSIS — Z9889 Other specified postprocedural states: Secondary | ICD-10-CM | POA: Insufficient documentation

## 2017-11-08 DIAGNOSIS — R1310 Dysphagia, unspecified: Secondary | ICD-10-CM

## 2017-11-08 DIAGNOSIS — K449 Diaphragmatic hernia without obstruction or gangrene: Secondary | ICD-10-CM | POA: Insufficient documentation

## 2017-11-08 DIAGNOSIS — K219 Gastro-esophageal reflux disease without esophagitis: Secondary | ICD-10-CM

## 2017-11-30 DIAGNOSIS — E119 Type 2 diabetes mellitus without complications: Secondary | ICD-10-CM | POA: Diagnosis not present

## 2017-11-30 DIAGNOSIS — H35361 Drusen (degenerative) of macula, right eye: Secondary | ICD-10-CM | POA: Diagnosis not present

## 2017-11-30 DIAGNOSIS — H25013 Cortical age-related cataract, bilateral: Secondary | ICD-10-CM | POA: Diagnosis not present

## 2017-11-30 DIAGNOSIS — H2513 Age-related nuclear cataract, bilateral: Secondary | ICD-10-CM | POA: Diagnosis not present

## 2017-11-30 DIAGNOSIS — H35033 Hypertensive retinopathy, bilateral: Secondary | ICD-10-CM | POA: Diagnosis not present

## 2017-12-06 ENCOUNTER — Encounter (HOSPITAL_COMMUNITY): Payer: Self-pay

## 2017-12-07 ENCOUNTER — Encounter (HOSPITAL_COMMUNITY): Payer: Self-pay

## 2017-12-07 ENCOUNTER — Other Ambulatory Visit: Payer: Self-pay

## 2017-12-18 ENCOUNTER — Ambulatory Visit (HOSPITAL_COMMUNITY): Payer: PPO | Admitting: Certified Registered Nurse Anesthetist

## 2017-12-18 ENCOUNTER — Encounter (HOSPITAL_COMMUNITY): Payer: Self-pay | Admitting: *Deleted

## 2017-12-18 ENCOUNTER — Encounter (HOSPITAL_COMMUNITY)
Admission: RE | Disposition: A | Discharge status: Home / Self Care | Payer: Self-pay | Source: Ambulatory Visit | Attending: Gastroenterology

## 2017-12-18 ENCOUNTER — Other Ambulatory Visit: Payer: Self-pay

## 2017-12-18 ENCOUNTER — Ambulatory Visit (HOSPITAL_COMMUNITY)
Admission: RE | Admit: 2017-12-18 | Discharge: 2017-12-18 | Disposition: A | Discharge status: Home / Self Care | Payer: PPO | Source: Ambulatory Visit | Attending: Gastroenterology | Admitting: Gastroenterology

## 2017-12-18 DIAGNOSIS — Z87891 Personal history of nicotine dependence: Secondary | ICD-10-CM | POA: Diagnosis not present

## 2017-12-18 DIAGNOSIS — K317 Polyp of stomach and duodenum: Secondary | ICD-10-CM

## 2017-12-18 DIAGNOSIS — Z7984 Long term (current) use of oral hypoglycemic drugs: Secondary | ICD-10-CM | POA: Diagnosis not present

## 2017-12-18 DIAGNOSIS — G4733 Obstructive sleep apnea (adult) (pediatric): Secondary | ICD-10-CM | POA: Insufficient documentation

## 2017-12-18 DIAGNOSIS — Z9884 Bariatric surgery status: Secondary | ICD-10-CM | POA: Insufficient documentation

## 2017-12-18 DIAGNOSIS — K449 Diaphragmatic hernia without obstruction or gangrene: Secondary | ICD-10-CM

## 2017-12-18 DIAGNOSIS — Z7982 Long term (current) use of aspirin: Secondary | ICD-10-CM | POA: Diagnosis not present

## 2017-12-18 DIAGNOSIS — E039 Hypothyroidism, unspecified: Secondary | ICD-10-CM | POA: Insufficient documentation

## 2017-12-18 DIAGNOSIS — K219 Gastro-esophageal reflux disease without esophagitis: Secondary | ICD-10-CM | POA: Insufficient documentation

## 2017-12-18 DIAGNOSIS — Z6841 Body Mass Index (BMI) 40.0 and over, adult: Secondary | ICD-10-CM | POA: Insufficient documentation

## 2017-12-18 DIAGNOSIS — R1310 Dysphagia, unspecified: Secondary | ICD-10-CM | POA: Diagnosis not present

## 2017-12-18 DIAGNOSIS — Z888 Allergy status to other drugs, medicaments and biological substances status: Secondary | ICD-10-CM | POA: Diagnosis not present

## 2017-12-18 DIAGNOSIS — E119 Type 2 diabetes mellitus without complications: Secondary | ICD-10-CM | POA: Diagnosis not present

## 2017-12-18 DIAGNOSIS — I10 Essential (primary) hypertension: Secondary | ICD-10-CM | POA: Diagnosis not present

## 2017-12-18 DIAGNOSIS — Z880 Allergy status to penicillin: Secondary | ICD-10-CM | POA: Insufficient documentation

## 2017-12-18 DIAGNOSIS — Z9989 Dependence on other enabling machines and devices: Secondary | ICD-10-CM | POA: Insufficient documentation

## 2017-12-18 DIAGNOSIS — Z79899 Other long term (current) drug therapy: Secondary | ICD-10-CM | POA: Insufficient documentation

## 2017-12-18 HISTORY — PX: ESOPHAGOGASTRODUODENOSCOPY (EGD) WITH PROPOFOL: SHX5813

## 2017-12-18 HISTORY — DX: Personal history of other diseases of the digestive system: Z87.19

## 2017-12-18 HISTORY — DX: Hypothyroidism, unspecified: E03.9

## 2017-12-18 HISTORY — DX: Gastro-esophageal reflux disease without esophagitis: K21.9

## 2017-12-18 HISTORY — PX: POLYPECTOMY: SHX5525

## 2017-12-18 LAB — GLUCOSE, CAPILLARY: Glucose-Capillary: 211 mg/dL — ABNORMAL HIGH (ref 65–99)

## 2017-12-18 SURGERY — ESOPHAGOGASTRODUODENOSCOPY (EGD) WITH PROPOFOL
Anesthesia: Monitor Anesthesia Care

## 2017-12-18 MED ORDER — LACTATED RINGERS IV SOLN
INTRAVENOUS | Status: DC
  Administered 2017-12-18: 10:00:00 via INTRAVENOUS

## 2017-12-18 MED ORDER — LIDOCAINE 2% (20 MG/ML) 5 ML SYRINGE
INTRAMUSCULAR | Status: DC | PRN
  Administered 2017-12-18: 100 mg via INTRAVENOUS

## 2017-12-18 MED ORDER — SODIUM CHLORIDE 0.9 % IV SOLN: INTRAVENOUS | Status: DC

## 2017-12-18 MED ORDER — ONDANSETRON HCL 4 MG/2ML IJ SOLN
INTRAMUSCULAR | Status: DC | PRN
  Administered 2017-12-18: 4 mg via INTRAVENOUS

## 2017-12-18 MED ORDER — PROPOFOL 500 MG/50ML IV EMUL
INTRAVENOUS | Status: DC | PRN
  Administered 2017-12-18: 200 ug/kg/min via INTRAVENOUS

## 2017-12-18 MED ORDER — PROPOFOL 10 MG/ML IV BOLUS
INTRAVENOUS | Status: AC
  Filled 2017-12-18: qty 40

## 2017-12-18 MED ORDER — PROPOFOL 10 MG/ML IV BOLUS
INTRAVENOUS | Status: AC
  Filled 2017-12-18: qty 20

## 2017-12-18 MED ORDER — PROPOFOL 10 MG/ML IV BOLUS
INTRAVENOUS | Status: DC | PRN
  Administered 2017-12-18: 20 mg via INTRAVENOUS

## 2017-12-18 SURGICAL SUPPLY — 14 items

## 2017-12-18 NOTE — Anesthesia Preprocedure Evaluation (Addendum)
Anesthesia Evaluation  Patient identified by MRN, date of birth, ID band Patient awake    Reviewed: Allergy & Precautions, NPO status , Patient's Chart, lab work & pertinent test results  History of Anesthesia Complications Negative for: history of anesthetic complications  Airway Mallampati: II  TM Distance: >3 FB Neck ROM: Full    Dental  (+) Dental Advisory Given   Pulmonary sleep apnea and Continuous Positive Airway Pressure Ventilation , former smoker (quit 1982),    breath sounds clear to auscultation       Cardiovascular hypertension, Pt. on medications (-) angina Rhythm:Regular Rate:Normal     Neuro/Psych negative neurological ROS     GI/Hepatic Neg liver ROS, GERD  Medicated and Controlled,S/p gastric bypass   Endo/Other  diabetes (glu 211), Oral Hypoglycemic AgentsHypothyroidism Morbid obesity  Renal/GU negative Renal ROS     Musculoskeletal   Abdominal (+) + obese,   Peds  Hematology negative hematology ROS (+)   Anesthesia Other Findings   Reproductive/Obstetrics                            Anesthesia Physical Anesthesia Plan  ASA: III  Anesthesia Plan: MAC   Post-op Pain Management:    Induction:   PONV Risk Score and Plan: 1 and Treatment may vary due to age or medical condition  Airway Management Planned: Natural Airway and Nasal Cannula  Additional Equipment:   Intra-op Plan:   Post-operative Plan:   Informed Consent: I have reviewed the patients History and Physical, chart, labs and discussed the procedure including the risks, benefits and alternatives for the proposed anesthesia with the patient or authorized representative who has indicated his/her understanding and acceptance.   Dental advisory given  Plan Discussed with: CRNA and Surgeon  Anesthesia Plan Comments: (Plan routine monitors, MAC)        Anesthesia Quick Evaluation

## 2017-12-18 NOTE — H&P (Signed)
History of Present Illness: This is a 62 year old male a S/P gastric stapling in 1983.  He relates several months of intermittent nausea and vomiting, occasional regurgitation and intermittent solid food dysphasia.  He has ongoing mild constipation that has not changed.  Last EGD and colonoscopy as below.  He was recommended to have an upper GI series following his 2010 EGD however it that apparently was not performed.  Hemosure test positive in March 2019.  EGD 09/2008 irreg z-line, 5cm HH, biopsies showed inflammation c/w GERD  Colonoscopy 11/2015 internal hemorrhoids, otherwise normal.   Current Medications, Allergies, Past Medical History, Past Surgical History, Family History and Social History were reviewed in [employer] record.  Physical Exam: General: Well developed, well nourished, obese, no acute distress Head: Normocephalic and atraumatic Eyes:  sclerae anicteric, EOMI Ears: Normal auditory acuity Mouth: No deformity or lesions Lungs: Clear throughout to auscultation Heart: Regular rate and rhythm; no murmurs, rubs or bruits Abdomen: Soft, non tender and non distended. No masses, hepatosplenomegaly or hernias noted. Normal Bowel sounds Rectal: not done Musculoskeletal: Symmetrical with no gross deformities  Pulses:  Normal pulses noted Extremities: No clubbing, cyanosis, edema or deformities noted Neurological: Alert oriented x 4, grossly nonfocal Psychological:  Alert and cooperative. Normal mood and affect  Assessment and Recommendations:  1. Dysphagia, regurgitation, GERD.  Rule out esophageal stricture, esophagitis, partial GOO, reduced gastric motility, enlarging hiatal hernia.  Follow antireflux measures.  Increase omeprazole to 20 mg twice daily.  Schedule UGI series and EGD. The risks (including bleeding, perforation, infection, missed lesions, medication reactions and possible hospitalization or surgery if complications occur), benefits, and  alternatives to endoscopy with possible biopsy and possible dilation were discussed with the patient and they consent to proceed.   2.  Hemosure stool test positive in 09/2017 likely due to internal hemorrhoids.  A complete colonoscopy with an adequate bowel prep was performed less than 2 years ago revealing only internal hemorrhoids.  There is little value in annual screening hemosure testing for several years following a complete colonoscopy. No plans to repeat colonoscopy at this time.    3. Morbid obesity, weight=395, BMI=52.   4. OSA

## 2017-12-18 NOTE — Discharge Instructions (Signed)
YOU HAD AN ENDOSCOPIC PROCEDURE TODAY: Refer to the procedure report and other information in the discharge instructions given to you for any specific questions about what was found during the examination. If this information does not answer your questions, please call [place] office at [phone] to clarify.   YOU SHOULD EXPECT: Some feelings of bloating in the abdomen. Passage of more gas than usual. Walking can help get rid of the air that was put into your GI tract during the procedure and reduce the bloating. If you had a lower endoscopy (such as a colonoscopy or flexible sigmoidoscopy) you may notice spotting of blood in your stool or on the toilet paper. Some abdominal soreness may be present for a day or two, also.  DIET: Your first meal following the procedure should be a light meal and then it is ok to progress to your normal diet. A half-sandwich or bowl of soup is an example of a good first meal. Heavy or fried foods are harder to digest and may make you feel nauseous or bloated. Drink plenty of fluids but you should avoid alcoholic beverages for 24 hours. If you had a esophageal dilation, please see attached instructions for diet.    ACTIVITY: Your care partner should take you home directly after the procedure. You should plan to take it easy, moving slowly for the rest of the day. You can resume normal activity the day after the procedure however YOU SHOULD NOT DRIVE, use power tools, machinery or perform tasks that involve climbing or major physical exertion for 24 hours (because of the sedation medicines used during the test).   SYMPTOMS TO REPORT IMMEDIATELY: A gastroenterologist can be reached at any hour. Please call [phone]  for any of the following symptoms:   Following upper endoscopy (EGD, EUS, ERCP, esophageal dilation) Vomiting of blood or coffee ground material  New, significant abdominal pain  New, significant chest pain or pain under the shoulder blades  Painful or  persistently difficult swallowing  New shortness of breath  Black, tarry-looking or red, bloody stools  FOLLOW UP:  If any biopsies were taken you will be contacted by phone or by letter within the next 1-3 weeks. Call [phone]  if you have not heard about the biopsies in 3 weeks.  Please also call with any specific questions about appointments or follow up tests.  

## 2017-12-18 NOTE — Transfer of Care (Signed)
Immediate Anesthesia Transfer of Care Note  Patient: Evan Berry  Procedure(s) Performed: ESOPHAGOGASTRODUODENOSCOPY (EGD) WITH PROPOFOL (N/A ) POLYPECTOMY  Patient Location: PACU  Anesthesia Type:MAC  Level of Consciousness: awake, alert  and oriented  Airway & Oxygen Therapy: Patient Spontanous Breathing and Patient connected to nasal cannula oxygen  Post-op Assessment: Report given to RN  Post vital signs: Reviewed and stable  Last Vitals:  Vitals Value Taken Time  BP 129/72 12/18/2017 11:11 AM  Temp    Pulse 66 12/18/2017 11:12 AM  Resp 22 12/18/2017 11:12 AM  SpO2 98 % 12/18/2017 11:12 AM  Vitals shown include unvalidated device data.  Last Pain:  Vitals:   12/18/17 1111  TempSrc:   PainSc: 0-No pain         Complications: No apparent anesthesia complications

## 2017-12-18 NOTE — Anesthesia Postprocedure Evaluation (Signed)
Anesthesia Post Note  Patient: Evan Berry  Procedure(s) Performed: ESOPHAGOGASTRODUODENOSCOPY (EGD) WITH PROPOFOL (N/A ) POLYPECTOMY     Patient location during evaluation: Endoscopy Anesthesia Type: MAC Level of consciousness: oriented, awake and alert and patient cooperative Pain management: pain level controlled Vital Signs Assessment: post-procedure vital signs reviewed and stable Respiratory status: spontaneous breathing, nonlabored ventilation and respiratory function stable Cardiovascular status: stable and blood pressure returned to baseline Postop Assessment: no apparent nausea or vomiting Anesthetic complications: no    Last Vitals:  Vitals:   12/18/17 1111 12/18/17 1120  BP: 129/72 137/69  Pulse: 68 61  Resp: (!) 23 14  Temp:    SpO2: 95% 99%    Last Pain:  Vitals:   12/18/17 1130  TempSrc:   PainSc: 0-No pain                 Bambie Pizzolato,E. Zlatan Hornback

## 2017-12-18 NOTE — Op Note (Signed)
[facility] Patient Name: Evan Berry Procedure Date: 12/18/2017 MRN: [id number] Attending MD: Ladene Artist , MD Date of Birth: August 20, 1955 CSN: [id number] Age: 62 Admit Type: Outpatient Procedure:                Upper GI endoscopy Indications:              Dysphagia, Gastroesophageal reflux disease Providers:                Pricilla Riffle. Fuller Plan, MD, Presley Raddle, RN, Cherylynn Ridges, Technician, Virgia Land, CRNA Referring MD:              Medicines:                Monitored Anesthesia Care Complications:            No immediate complications. Estimated Blood Loss:     Estimated blood loss was minimal. Procedure:                Pre-Anesthesia Assessment:                           - Prior to the procedure, a History and Physical                            was performed, and patient medications and                            allergies were reviewed. The patient's tolerance of                            previous anesthesia was also reviewed. The risks                            and benefits of the procedure and the sedation                            options and risks were discussed with the patient.                            All questions were answered, and informed consent                            was obtained. Prior Anticoagulants: The patient has                            taken no previous anticoagulant or antiplatelet                            agents. ASA Grade Assessment: III - A patient with                            severe systemic disease. After reviewing the risks  and benefits, the patient was deemed in                            satisfactory condition to undergo the procedure.                           After obtaining informed consent, the endoscope was                            passed under direct vision. Throughout the                            procedure, the patient's blood pressure, pulse, and                             oxygen saturations were monitored continuously. The                            Endoscope was introduced through the mouth, and                            advanced to the second part of duodenum. The upper                            GI endoscopy was accomplished without difficulty.                            The patient tolerated the procedure well. Scope In: Scope Out: Findings:      The examined esophagus was normal.      Evidence of a patent gastric partitioning in the fundus was found. A       gastric pouch with a normal size was found. This was traversed.      Multiple 3 to 6 mm sessile polyps with no bleeding and no stigmata of       recent bleeding were found in the gastric body. Biopsies were taken with       a cold forceps for histology.      The exam of the stomach was otherwise normal.      The duodenal bulb and second portion of the duodenum were normal. Impression:               - Normal esophagus.                           - Patent gastric partitioning with a normal-sized                            pouch.                           - Multiple gastric polyps. Biopsied.                           - Normal duodenal bulb and second portion of the  duodenum. Moderate Sedation:      N/A- Per Anesthesia Care Recommendation:           - Multiple, small low fat meals.                           - Antireflux measures.                           - Continue present medications.                           - Await pathology results. Procedure Code(s):        --- Professional ---                           [phone], Esophagogastroduodenoscopy, flexible,                            transoral; with biopsy, single or multiple Diagnosis Code(s):        --- Professional ---                           Z98.84, Bariatric surgery status                           K31.7, Polyp of stomach and duodenum                           R13.10, Dysphagia,  unspecified                           K21.9, Gastro-esophageal reflux disease without                            esophagitis CPT copyright 2017 American Medical Association. All rights reserved. The codes documented in this report are preliminary and upon coder review may  be revised to meet current compliance requirements. Ladene Artist, MD 12/18/2017 11:12:13 AM This report has been signed electronically. Number of Addenda: 0

## 2017-12-19 ENCOUNTER — Encounter (HOSPITAL_COMMUNITY): Payer: Self-pay | Admitting: Gastroenterology

## 2017-12-19 NOTE — Addendum Note (Signed)
Addendum  created 12/19/17 0654 by Lollie Sails, CRNA   Charge Capture section accepted

## 2017-12-20 ENCOUNTER — Encounter: Payer: Self-pay | Admitting: Gastroenterology

## 2018-01-30 DIAGNOSIS — Z6841 Body Mass Index (BMI) 40.0 and over, adult: Secondary | ICD-10-CM | POA: Diagnosis not present

## 2018-01-30 DIAGNOSIS — E1140 Type 2 diabetes mellitus with diabetic neuropathy, unspecified: Secondary | ICD-10-CM | POA: Diagnosis not present

## 2018-01-30 DIAGNOSIS — G4733 Obstructive sleep apnea (adult) (pediatric): Secondary | ICD-10-CM | POA: Diagnosis not present

## 2018-01-30 DIAGNOSIS — I10 Essential (primary) hypertension: Secondary | ICD-10-CM | POA: Diagnosis not present

## 2018-01-30 DIAGNOSIS — D696 Thrombocytopenia, unspecified: Secondary | ICD-10-CM | POA: Diagnosis not present

## 2018-01-30 DIAGNOSIS — E038 Other specified hypothyroidism: Secondary | ICD-10-CM | POA: Diagnosis not present

## 2018-05-15 DIAGNOSIS — E1140 Type 2 diabetes mellitus with diabetic neuropathy, unspecified: Secondary | ICD-10-CM | POA: Diagnosis not present

## 2018-05-15 DIAGNOSIS — G4733 Obstructive sleep apnea (adult) (pediatric): Secondary | ICD-10-CM | POA: Diagnosis not present

## 2018-05-15 DIAGNOSIS — Z23 Encounter for immunization: Secondary | ICD-10-CM | POA: Diagnosis not present

## 2018-05-15 DIAGNOSIS — E038 Other specified hypothyroidism: Secondary | ICD-10-CM | POA: Diagnosis not present

## 2018-05-15 DIAGNOSIS — Z6841 Body Mass Index (BMI) 40.0 and over, adult: Secondary | ICD-10-CM | POA: Diagnosis not present

## 2018-05-15 DIAGNOSIS — I10 Essential (primary) hypertension: Secondary | ICD-10-CM | POA: Diagnosis not present

## 2018-09-04 DIAGNOSIS — I10 Essential (primary) hypertension: Secondary | ICD-10-CM | POA: Diagnosis not present

## 2018-09-04 DIAGNOSIS — D179 Benign lipomatous neoplasm, unspecified: Secondary | ICD-10-CM | POA: Diagnosis not present

## 2018-09-04 DIAGNOSIS — E1140 Type 2 diabetes mellitus with diabetic neuropathy, unspecified: Secondary | ICD-10-CM | POA: Diagnosis not present

## 2018-09-04 DIAGNOSIS — G4733 Obstructive sleep apnea (adult) (pediatric): Secondary | ICD-10-CM | POA: Diagnosis not present

## 2018-09-04 DIAGNOSIS — D696 Thrombocytopenia, unspecified: Secondary | ICD-10-CM | POA: Diagnosis not present

## 2018-09-04 DIAGNOSIS — Z6841 Body Mass Index (BMI) 40.0 and over, adult: Secondary | ICD-10-CM | POA: Diagnosis not present

## 2018-09-04 DIAGNOSIS — E038 Other specified hypothyroidism: Secondary | ICD-10-CM | POA: Diagnosis not present

## 2018-11-22 DIAGNOSIS — Z125 Encounter for screening for malignant neoplasm of prostate: Secondary | ICD-10-CM | POA: Diagnosis not present

## 2018-11-22 DIAGNOSIS — E1129 Type 2 diabetes mellitus with other diabetic kidney complication: Secondary | ICD-10-CM | POA: Diagnosis not present

## 2018-11-22 DIAGNOSIS — E038 Other specified hypothyroidism: Secondary | ICD-10-CM | POA: Diagnosis not present

## 2018-11-22 DIAGNOSIS — E1140 Type 2 diabetes mellitus with diabetic neuropathy, unspecified: Secondary | ICD-10-CM | POA: Diagnosis not present

## 2018-11-22 DIAGNOSIS — E7849 Other hyperlipidemia: Secondary | ICD-10-CM | POA: Diagnosis not present

## 2018-11-23 DIAGNOSIS — R82998 Other abnormal findings in urine: Secondary | ICD-10-CM | POA: Diagnosis not present

## 2018-11-28 DIAGNOSIS — D179 Benign lipomatous neoplasm, unspecified: Secondary | ICD-10-CM | POA: Diagnosis not present

## 2018-11-28 DIAGNOSIS — N182 Chronic kidney disease, stage 2 (mild): Secondary | ICD-10-CM | POA: Diagnosis not present

## 2018-11-28 DIAGNOSIS — D696 Thrombocytopenia, unspecified: Secondary | ICD-10-CM | POA: Diagnosis not present

## 2018-11-28 DIAGNOSIS — Z1331 Encounter for screening for depression: Secondary | ICD-10-CM | POA: Diagnosis not present

## 2018-11-28 DIAGNOSIS — G4733 Obstructive sleep apnea (adult) (pediatric): Secondary | ICD-10-CM | POA: Diagnosis not present

## 2018-11-28 DIAGNOSIS — Z1339 Encounter for screening examination for other mental health and behavioral disorders: Secondary | ICD-10-CM | POA: Diagnosis not present

## 2018-11-28 DIAGNOSIS — G609 Hereditary and idiopathic neuropathy, unspecified: Secondary | ICD-10-CM | POA: Diagnosis not present

## 2018-11-28 DIAGNOSIS — Z0000 Encounter for general adult medical examination without abnormal findings: Secondary | ICD-10-CM | POA: Diagnosis not present

## 2018-11-28 DIAGNOSIS — R809 Proteinuria, unspecified: Secondary | ICD-10-CM | POA: Diagnosis not present

## 2018-11-28 DIAGNOSIS — E1140 Type 2 diabetes mellitus with diabetic neuropathy, unspecified: Secondary | ICD-10-CM | POA: Diagnosis not present

## 2018-11-28 DIAGNOSIS — E1129 Type 2 diabetes mellitus with other diabetic kidney complication: Secondary | ICD-10-CM | POA: Diagnosis not present

## 2018-12-03 DIAGNOSIS — H04123 Dry eye syndrome of bilateral lacrimal glands: Secondary | ICD-10-CM | POA: Diagnosis not present

## 2018-12-03 DIAGNOSIS — H2513 Age-related nuclear cataract, bilateral: Secondary | ICD-10-CM | POA: Diagnosis not present

## 2018-12-03 DIAGNOSIS — H35033 Hypertensive retinopathy, bilateral: Secondary | ICD-10-CM | POA: Diagnosis not present

## 2018-12-03 DIAGNOSIS — H25013 Cortical age-related cataract, bilateral: Secondary | ICD-10-CM | POA: Diagnosis not present

## 2018-12-12 DIAGNOSIS — M545 Low back pain: Secondary | ICD-10-CM | POA: Diagnosis not present

## 2018-12-12 DIAGNOSIS — M5416 Radiculopathy, lumbar region: Secondary | ICD-10-CM | POA: Diagnosis not present

## 2018-12-25 ENCOUNTER — Other Ambulatory Visit: Payer: Self-pay | Admitting: Gastroenterology

## 2019-03-19 DIAGNOSIS — E1140 Type 2 diabetes mellitus with diabetic neuropathy, unspecified: Secondary | ICD-10-CM | POA: Diagnosis not present

## 2019-03-19 DIAGNOSIS — N182 Chronic kidney disease, stage 2 (mild): Secondary | ICD-10-CM | POA: Diagnosis not present

## 2019-03-19 DIAGNOSIS — E1129 Type 2 diabetes mellitus with other diabetic kidney complication: Secondary | ICD-10-CM | POA: Diagnosis not present

## 2019-03-19 DIAGNOSIS — I129 Hypertensive chronic kidney disease with stage 1 through stage 4 chronic kidney disease, or unspecified chronic kidney disease: Secondary | ICD-10-CM | POA: Diagnosis not present

## 2019-03-19 DIAGNOSIS — E039 Hypothyroidism, unspecified: Secondary | ICD-10-CM | POA: Diagnosis not present

## 2019-03-19 DIAGNOSIS — M1990 Unspecified osteoarthritis, unspecified site: Secondary | ICD-10-CM | POA: Diagnosis not present

## 2019-03-19 DIAGNOSIS — R809 Proteinuria, unspecified: Secondary | ICD-10-CM | POA: Diagnosis not present

## 2019-03-19 DIAGNOSIS — G609 Hereditary and idiopathic neuropathy, unspecified: Secondary | ICD-10-CM | POA: Diagnosis not present

## 2019-03-19 DIAGNOSIS — G4733 Obstructive sleep apnea (adult) (pediatric): Secondary | ICD-10-CM | POA: Diagnosis not present

## 2019-03-19 DIAGNOSIS — R1310 Dysphagia, unspecified: Secondary | ICD-10-CM | POA: Diagnosis not present

## 2019-03-19 DIAGNOSIS — D696 Thrombocytopenia, unspecified: Secondary | ICD-10-CM | POA: Diagnosis not present

## 2019-03-21 DIAGNOSIS — E1140 Type 2 diabetes mellitus with diabetic neuropathy, unspecified: Secondary | ICD-10-CM | POA: Diagnosis not present

## 2019-03-21 DIAGNOSIS — Z23 Encounter for immunization: Secondary | ICD-10-CM | POA: Diagnosis not present

## 2019-04-18 DIAGNOSIS — N182 Chronic kidney disease, stage 2 (mild): Secondary | ICD-10-CM | POA: Diagnosis not present

## 2019-04-18 DIAGNOSIS — I129 Hypertensive chronic kidney disease with stage 1 through stage 4 chronic kidney disease, or unspecified chronic kidney disease: Secondary | ICD-10-CM | POA: Diagnosis not present

## 2019-04-18 DIAGNOSIS — E1140 Type 2 diabetes mellitus with diabetic neuropathy, unspecified: Secondary | ICD-10-CM | POA: Diagnosis not present

## 2019-07-23 DIAGNOSIS — D179 Benign lipomatous neoplasm, unspecified: Secondary | ICD-10-CM | POA: Diagnosis not present

## 2019-07-23 DIAGNOSIS — D696 Thrombocytopenia, unspecified: Secondary | ICD-10-CM | POA: Diagnosis not present

## 2019-07-23 DIAGNOSIS — E039 Hypothyroidism, unspecified: Secondary | ICD-10-CM | POA: Diagnosis not present

## 2019-07-23 DIAGNOSIS — G4733 Obstructive sleep apnea (adult) (pediatric): Secondary | ICD-10-CM | POA: Diagnosis not present

## 2019-07-23 DIAGNOSIS — N182 Chronic kidney disease, stage 2 (mild): Secondary | ICD-10-CM | POA: Diagnosis not present

## 2019-07-23 DIAGNOSIS — E1129 Type 2 diabetes mellitus with other diabetic kidney complication: Secondary | ICD-10-CM | POA: Diagnosis not present

## 2019-07-23 DIAGNOSIS — E1140 Type 2 diabetes mellitus with diabetic neuropathy, unspecified: Secondary | ICD-10-CM | POA: Diagnosis not present

## 2019-07-23 DIAGNOSIS — I129 Hypertensive chronic kidney disease with stage 1 through stage 4 chronic kidney disease, or unspecified chronic kidney disease: Secondary | ICD-10-CM | POA: Diagnosis not present

## 2019-07-24 DIAGNOSIS — E1140 Type 2 diabetes mellitus with diabetic neuropathy, unspecified: Secondary | ICD-10-CM | POA: Diagnosis not present

## 2019-09-03 DIAGNOSIS — E1129 Type 2 diabetes mellitus with other diabetic kidney complication: Secondary | ICD-10-CM | POA: Diagnosis not present

## 2019-09-03 DIAGNOSIS — I129 Hypertensive chronic kidney disease with stage 1 through stage 4 chronic kidney disease, or unspecified chronic kidney disease: Secondary | ICD-10-CM | POA: Diagnosis not present

## 2019-09-03 DIAGNOSIS — N182 Chronic kidney disease, stage 2 (mild): Secondary | ICD-10-CM | POA: Diagnosis not present

## 2020-01-01 ENCOUNTER — Encounter (HOSPITAL_COMMUNITY): Payer: Self-pay | Admitting: Nurse Practitioner

## 2020-01-01 ENCOUNTER — Other Ambulatory Visit: Payer: Self-pay

## 2020-01-01 ENCOUNTER — Ambulatory Visit (HOSPITAL_COMMUNITY)
Admission: RE | Admit: 2020-01-01 | Discharge: 2020-01-01 | Disposition: A | Discharge status: Home / Self Care | Payer: Medicare Other | Source: Ambulatory Visit | Attending: Nurse Practitioner | Admitting: Nurse Practitioner

## 2020-01-01 VITALS — BP 170/88 | HR 96 | Ht 73.0 in | Wt 368.8 lb

## 2020-01-01 DIAGNOSIS — Z9989 Dependence on other enabling machines and devices: Secondary | ICD-10-CM | POA: Diagnosis not present

## 2020-01-01 DIAGNOSIS — I10 Essential (primary) hypertension: Secondary | ICD-10-CM | POA: Insufficient documentation

## 2020-01-01 DIAGNOSIS — K219 Gastro-esophageal reflux disease without esophagitis: Secondary | ICD-10-CM | POA: Insufficient documentation

## 2020-01-01 DIAGNOSIS — I4819 Other persistent atrial fibrillation: Secondary | ICD-10-CM | POA: Insufficient documentation

## 2020-01-01 DIAGNOSIS — E119 Type 2 diabetes mellitus without complications: Secondary | ICD-10-CM | POA: Insufficient documentation

## 2020-01-01 DIAGNOSIS — Z7984 Long term (current) use of oral hypoglycemic drugs: Secondary | ICD-10-CM | POA: Diagnosis not present

## 2020-01-01 DIAGNOSIS — G4733 Obstructive sleep apnea (adult) (pediatric): Secondary | ICD-10-CM | POA: Diagnosis not present

## 2020-01-01 DIAGNOSIS — Z91120 Patient's intentional underdosing of medication regimen due to financial hardship: Secondary | ICD-10-CM | POA: Insufficient documentation

## 2020-01-01 DIAGNOSIS — Z79899 Other long term (current) drug therapy: Secondary | ICD-10-CM | POA: Insufficient documentation

## 2020-01-01 DIAGNOSIS — I4891 Unspecified atrial fibrillation: Secondary | ICD-10-CM

## 2020-01-01 DIAGNOSIS — Z79890 Hormone replacement therapy (postmenopausal): Secondary | ICD-10-CM | POA: Diagnosis not present

## 2020-01-01 DIAGNOSIS — E039 Hypothyroidism, unspecified: Secondary | ICD-10-CM | POA: Insufficient documentation

## 2020-01-01 MED ORDER — APIXABAN 5 MG PO TABS
5.0000 mg | ORAL_TABLET | Freq: Two times a day (BID) | ORAL | 3 refills | Status: DC
Start: 2020-01-01 — End: 2021-01-19

## 2020-01-01 MED ORDER — METOPROLOL TARTRATE 25 MG PO TABS
12.5000 mg | ORAL_TABLET | Freq: Two times a day (BID) | ORAL | 3 refills | Status: DC
Start: 2020-01-01 — End: 2020-03-26

## 2020-01-01 NOTE — Patient Instructions (Signed)
Stop aspirin  Start Eliquis 5mg  twice a day  Start metoprolol 1/2 tablet twice a day

## 2020-01-01 NOTE — Progress Notes (Signed)
Primary Care Physician: Evan Infante, MD Referring Physician: Dr. [name] is a 64 y.o. male with a h/o obesity, HTN, DM, is in the afib cliinc for afib that was recently seen on EKG and still present on EKG today. Pt feels it may have been going on for a few months as he has felt different. He was ordered Eliquis yesterday but he did not pick it up as it is a brand drug, costing over $100 a month and he is in the do nut hole from  being on expensive DM meds the first of the year,  which he has since  stopped. He  is rate  controlled today. He does not drink, nor smoke, moderate caffeine use . He  does use  cpap for years but stopped going to the sleep MD as he did not care for him and this has not been checked in some time to see if pressures are adequate. He has had a remote echo.   Today, he denies symptoms of palpitations, chest pain, shortness of breath, orthopnea, PND, lower extremity edema, dizziness, presyncope, syncope, or neurologic sequela. The patient is tolerating medications without difficulties and is otherwise without complaint today.   Past Medical History:  Diagnosis Date  . Allergic rhinitis   . Arthritis   . Blood transfusion without reported diagnosis   . Cataract   . Chronic kidney disease    pt. denies  . Diabetes ([place])    type 2  . Dyspnea on exertion   . GERD (gastroesophageal reflux disease)   . History of hiatal hernia   . Hyperlipidemia   . Hypertension   . Hypothyroidism   . Iron deficiency anemia   . OSA (obstructive sleep apnea)   . PUD (peptic ulcer disease)   . Sleep apnea    WEARS CPAP   Past Surgical History:  Procedure Laterality Date  . BACK SURGERY  11/2008   x2  . bleeding ulcer surgery    . CHOLECYSTECTOMY  1998  . COLONOSCOPY    . ESOPHAGOGASTRODUODENOSCOPY (EGD) WITH PROPOFOL N/A 12/18/2017   Procedure: ESOPHAGOGASTRODUODENOSCOPY (EGD) WITH PROPOFOL;  Surgeon: Ladene Artist, MD;  Location: [facility] ENDOSCOPY;  Service:  Endoscopy;  Laterality: N/A;  . GASTRIC BYPASS  1983  . GASTRIC RESTRICTION SURGERY    . POLYPECTOMY  12/18/2017   Procedure: POLYPECTOMY;  Surgeon: Ladene Artist, MD;  Location: [facility] ENDOSCOPY;  Service: Endoscopy;;    Current Outpatient Medications  Medication Sig Dispense Refill  . levothyroxine (SYNTHROID, LEVOTHROID) 150 MCG tablet Take 150 mcg by mouth daily.      . metFORMIN (GLUCOPHAGE-XR) 500 MG 24 hr tablet Take 1,000 mg by mouth 2 (two) times daily.  3  . Multiple Vitamin (MULTIVITAMIN) capsule Take 1 capsule by mouth daily.      . Omega-3 Fatty Acids (FISH OIL) 1200 MG CAPS Take 1,200 mg by mouth daily.    Marland Kitchen omeprazole (PRILOSEC) 20 MG capsule TAKE 1 CAPSULE (20 MG TOTAL) BY MOUTH 2 (TWO) TIMES DAILY BEFORE A MEAL. (Patient taking differently: Take 20 mg by mouth daily. ) 180 capsule 3  . ONETOUCH VERIO test strip     . pravastatin (PRAVACHOL) 20 MG tablet Take 10 mg by mouth daily.     . ramipril (ALTACE) 10 MG capsule Take 10 mg by mouth daily.     Marland Kitchen apixaban (ELIQUIS) 5 MG TABS tablet Take 1 tablet (5 mg total) by mouth 2 (two) times  daily. 60 tablet 3  . metoprolol tartrate (LOPRESSOR) 25 MG tablet Take 0.5 tablets (12.5 mg total) by mouth 2 (two) times daily. 30 tablet 3   No current facility-administered medications for this encounter.    Allergies  Allergen Reactions  . Penicillins     Can't remember reaction Has patient had a PCN reaction causing immediate rash, facial/tongue/throat swelling, SOB or lightheadedness with hypotension: No Has patient had a PCN reaction causing severe rash involving mucus membranes or skin necrosis: No Has patient had a PCN reaction that required hospitalization: No Has patient had a PCN reaction occurring within the last 10 years: No If all of the above answers are "NO", then may proceed with Cephalosporin use.   Marland Kitchen Rofecoxib     Vioxx  REACTION: increase BP    Social History   Socioeconomic History  . Marital status: Married     Spouse name: Jeani Hawking  . Number of children: 2  . Years of education: Not on file  . Highest education level: Not on file  Occupational History  . Occupation: MAINTENANCE    Employer: [employer]  Tobacco Use  . Smoking status: Former Smoker    Packs/day: 1.00    Years: 1.00    Pack years: 1.00    Types: Cigarettes    Quit date: 07/18/1980    Years since quitting: 39.4  . Smokeless tobacco: Never Used  Vaping Use  . Vaping Use: Never used  Substance and Sexual Activity  . Alcohol use: No    Alcohol/week: 0.0 standard drinks  . Drug use: No  . Sexual activity: Yes  Other Topics Concern  . Not on file  Social History Narrative  . Not on file   Social Determinants of Health   Financial Resource Strain:   . Difficulty of Paying Living Expenses:   Food Insecurity:   . Worried About Charity fundraiser in the Last Year:   . Arboriculturist in the Last Year:   Transportation Needs:   . Film/video editor (Medical):   Marland Kitchen Lack of Transportation (Non-Medical):   Physical Activity:   . Days of Exercise per Week:   . Minutes of Exercise per Session:   Stress:   . Feeling of Stress :   Social Connections:   . Frequency of Communication with Friends and Family:   . Frequency of Social Gatherings with Friends and Family:   . Attends Religious Services:   . Active Member of Clubs or Organizations:   . Attends Archivist Meetings:   Marland Kitchen Marital Status:   Intimate Partner Violence:   . Fear of Current or Ex-Partner:   . Emotionally Abused:   Marland Kitchen Physically Abused:   . Sexually Abused:     Family History  Problem Relation Age of Onset  . Heart disease Maternal Grandmother   . Heart disease Mother   . Cancer Paternal Grandmother        unsure what kind  . Lung cancer Father   . Colon cancer Neg Hx   . Esophageal cancer Neg Hx   . Rectal cancer Neg Hx   . Stomach cancer Neg Hx     ROS- All systems are reviewed and negative except as per the HPI above  Physical  Exam: Vitals:   01/01/20 1433  BP: (!) 170/88  Pulse: 96  Weight: (!) 167.3 kg  Height: 6\' 1"  (1.854 m)   Wt Readings from Last 3 Encounters:  01/01/20 (!) 167.3  kg  12/18/17 (!) 179.4 kg  10/25/17 (!) 179.4 kg    Labs: Lab Results  Component Value Date   NA 141 01/20/2011   K 4.7 01/20/2011   CL 104 01/20/2011   CO2 30 01/20/2011   GLUCOSE 186 (H) 01/20/2011   BUN 26 (H) 01/20/2011   CREATININE 1.39 (H) 01/20/2011   CALCIUM 9.4 01/20/2011   No results found for: INR No results found for: CHOL, HDL, LDLCALC, TRIG   GEN- The patient is well appearing, alert and oriented x 3 today.   Head- normocephalic, atraumatic Eyes-  Sclera clear, conjunctiva pink Ears- hearing intact Oropharynx- clear Neck- supple, no JVP Lymph- no cervical lymphadenopathy Lungs- Clear to ausculation bilaterally, normal work of breathing Heart- irregular rate and rhythm, no murmurs, rubs or gallops, PMI not laterally displaced GI- soft, NT, ND, + BS Extremities- no clubbing, cyanosis, or edema MS- no significant deformity or atrophy Skin- no rash or lesion Psych- euthymic mood, full affect Neuro- strength and sensation are intact  EKG- afib at 96 bpm with PVC's  PCP records reviewed     Assessment and Plan: 1. New onset persistent afib General education re afib  Will add metoprolol tartrate 25 mg 1/2 tab for  rate control Weight  loss encouraged Will  need echo when returns to [facility]  Encouraged to have cpap machine interrogated to see if pressures are adequate to treat sleep apnea      2. CHA2DS2VASc score of at least 3 Start eliquis 5 mg bid  Bleeding risk and precautions discussed  Stop daily asa  Will give 30 day free card Discussed with pt with his stroke risk of 3 , he will need to stay on anticoagulation long term  He has some hesitation re cost of eliquis but does not want to take warfarin He may be able to get assistance from  the drug company once he pays 3% of his income  out of pocket for the rest of the year  F/u in one week here for effect of adding BB and to see he did start eliquis Explained to pt I anticipated a cardioversion will be needed and the importance of taking eliquis on a regular basis without any missed doses  Butch Penny C. Amamda Curbow, [facility] [address] [place], [place] [address] [phone]

## 2020-01-08 ENCOUNTER — Other Ambulatory Visit: Payer: Self-pay

## 2020-01-08 ENCOUNTER — Ambulatory Visit (HOSPITAL_COMMUNITY)
Admission: RE | Admit: 2020-01-08 | Discharge: 2020-01-08 | Disposition: A | Discharge status: Home / Self Care | Payer: Medicare Other | Source: Ambulatory Visit | Attending: Nurse Practitioner | Admitting: Nurse Practitioner

## 2020-01-08 VITALS — BP 150/88 | HR 72 | Ht 73.0 in | Wt 368.4 lb

## 2020-01-08 DIAGNOSIS — D6869 Other thrombophilia: Secondary | ICD-10-CM | POA: Diagnosis not present

## 2020-01-08 DIAGNOSIS — I10 Essential (primary) hypertension: Secondary | ICD-10-CM | POA: Insufficient documentation

## 2020-01-08 DIAGNOSIS — Z880 Allergy status to penicillin: Secondary | ICD-10-CM | POA: Diagnosis not present

## 2020-01-08 DIAGNOSIS — I4819 Other persistent atrial fibrillation: Secondary | ICD-10-CM | POA: Diagnosis not present

## 2020-01-08 DIAGNOSIS — Z9049 Acquired absence of other specified parts of digestive tract: Secondary | ICD-10-CM | POA: Diagnosis not present

## 2020-01-08 DIAGNOSIS — K219 Gastro-esophageal reflux disease without esophagitis: Secondary | ICD-10-CM | POA: Insufficient documentation

## 2020-01-08 DIAGNOSIS — Z7901 Long term (current) use of anticoagulants: Secondary | ICD-10-CM | POA: Diagnosis not present

## 2020-01-08 DIAGNOSIS — Z8711 Personal history of peptic ulcer disease: Secondary | ICD-10-CM | POA: Insufficient documentation

## 2020-01-08 DIAGNOSIS — E039 Hypothyroidism, unspecified: Secondary | ICD-10-CM | POA: Insufficient documentation

## 2020-01-08 DIAGNOSIS — Z8249 Family history of ischemic heart disease and other diseases of the circulatory system: Secondary | ICD-10-CM | POA: Diagnosis not present

## 2020-01-08 DIAGNOSIS — H269 Unspecified cataract: Secondary | ICD-10-CM | POA: Diagnosis not present

## 2020-01-08 DIAGNOSIS — Z79890 Hormone replacement therapy (postmenopausal): Secondary | ICD-10-CM | POA: Diagnosis not present

## 2020-01-08 DIAGNOSIS — Z888 Allergy status to other drugs, medicaments and biological substances status: Secondary | ICD-10-CM | POA: Diagnosis not present

## 2020-01-08 DIAGNOSIS — E669 Obesity, unspecified: Secondary | ICD-10-CM | POA: Insufficient documentation

## 2020-01-08 DIAGNOSIS — G4733 Obstructive sleep apnea (adult) (pediatric): Secondary | ICD-10-CM | POA: Diagnosis not present

## 2020-01-08 DIAGNOSIS — Z79899 Other long term (current) drug therapy: Secondary | ICD-10-CM | POA: Insufficient documentation

## 2020-01-08 DIAGNOSIS — M1990 Unspecified osteoarthritis, unspecified site: Secondary | ICD-10-CM | POA: Insufficient documentation

## 2020-01-08 DIAGNOSIS — E1136 Type 2 diabetes mellitus with diabetic cataract: Secondary | ICD-10-CM | POA: Diagnosis not present

## 2020-01-08 DIAGNOSIS — J309 Allergic rhinitis, unspecified: Secondary | ICD-10-CM | POA: Insufficient documentation

## 2020-01-08 DIAGNOSIS — Z87891 Personal history of nicotine dependence: Secondary | ICD-10-CM | POA: Diagnosis not present

## 2020-01-08 DIAGNOSIS — E785 Hyperlipidemia, unspecified: Secondary | ICD-10-CM | POA: Diagnosis not present

## 2020-01-08 DIAGNOSIS — Z9884 Bariatric surgery status: Secondary | ICD-10-CM | POA: Diagnosis not present

## 2020-01-08 DIAGNOSIS — D509 Iron deficiency anemia, unspecified: Secondary | ICD-10-CM | POA: Insufficient documentation

## 2020-01-08 DIAGNOSIS — I4891 Unspecified atrial fibrillation: Secondary | ICD-10-CM | POA: Diagnosis not present

## 2020-01-08 DIAGNOSIS — Z7984 Long term (current) use of oral hypoglycemic drugs: Secondary | ICD-10-CM | POA: Insufficient documentation

## 2020-01-08 NOTE — Progress Notes (Signed)
Primary Care Physician: Crist Infante, MD Referring Physician: Dr. Claudell Kyle is a 64 y.o. male with a h/o obesity, HTN, DM, is in the afib cliinc for afib that was recently seen on EKG and still present on EKG today. Pt feels it may have been going on for a few months as he has felt different. He was ordered Eliquis yesterday but he did not pick it up as it is a brand drug, costing over $100 a month and he is in the do nut hole from  being on expensive DM meds the first of the year,  which he has since  stopped. He  is rate  controlled today. He does not drink, nor smoke, moderate caffeine use . He  does use  cpap for years but stopped going to the sleep MD as he did not care for him and this has not been checked in some time to see if pressures are adequate. He has had a remote echo.   F/u in the afib clinic, 6/23. EKG does show a few sinus beats but it still looks more like probable afib., rate controlled in the 70's. He is tolerating  the BB and eliquis, but has noted that he wakes up now every morning around 4 am.he has cut back on his mountain dew and is planning to go to decaf with his current supply is out.   Today, he denies symptoms of palpitations, chest pain, shortness of breath, orthopnea, PND, lower extremity edema, dizziness, presyncope, syncope, or neurologic sequela. The patient is tolerating medications without difficulties and is otherwise without complaint today.   Past Medical History:  Diagnosis Date  . Allergic rhinitis   . Arthritis   . Blood transfusion without reported diagnosis   . Cataract   . Chronic kidney disease    pt. denies  . Diabetes ([place])    type 2  . Dyspnea on exertion   . GERD (gastroesophageal reflux disease)   . History of hiatal hernia   . Hyperlipidemia   . Hypertension   . Hypothyroidism   . Iron deficiency anemia   . OSA (obstructive sleep apnea)   . PUD (peptic ulcer disease)   . Sleep apnea    WEARS CPAP   Past  Surgical History:  Procedure Laterality Date  . BACK SURGERY  11/2008   x2  . bleeding ulcer surgery    . CHOLECYSTECTOMY  1998  . COLONOSCOPY    . ESOPHAGOGASTRODUODENOSCOPY (EGD) WITH PROPOFOL N/A 12/18/2017   Procedure: ESOPHAGOGASTRODUODENOSCOPY (EGD) WITH PROPOFOL;  Surgeon: Ladene Artist, MD;  Location: [facility];  Service: Endoscopy;  Laterality: N/A;  . GASTRIC BYPASS  1983  . GASTRIC RESTRICTION SURGERY    . POLYPECTOMY  12/18/2017   Procedure: POLYPECTOMY;  Surgeon: Ladene Artist, MD;  Location: [facility];  Service: Endoscopy;;    Current Outpatient Medications  Medication Sig Dispense Refill  . apixaban (ELIQUIS) 5 MG TABS tablet Take 1 tablet (5 mg total) by mouth 2 (two) times daily. 60 tablet 3  . levothyroxine (SYNTHROID, LEVOTHROID) 150 MCG tablet Take 150 mcg by mouth daily.      . metFORMIN (GLUCOPHAGE-XR) 500 MG 24 hr tablet Take 1,000 mg by mouth 2 (two) times daily.  3  . metoprolol tartrate (LOPRESSOR) 25 MG tablet Take 0.5 tablets (12.5 mg total) by mouth 2 (two) times daily. 30 tablet 3  . Multiple Vitamin (MULTIVITAMIN) capsule Take 1 capsule by mouth daily.      Marland Kitchen  Omega-3 Fatty Acids (FISH OIL) 1200 MG CAPS Take 1,200 mg by mouth daily.    Marland Kitchen omeprazole (PRILOSEC) 20 MG capsule TAKE 1 CAPSULE (20 MG TOTAL) BY MOUTH 2 (TWO) TIMES DAILY BEFORE A MEAL. (Patient taking differently: Take 20 mg by mouth daily. ) 180 capsule 3  . ONETOUCH VERIO test strip     . pravastatin (PRAVACHOL) 20 MG tablet Take 10 mg by mouth daily.     . ramipril (ALTACE) 10 MG capsule Take 10 mg by mouth daily.      No current facility-administered medications for this encounter.    Allergies  Allergen Reactions  . Penicillins     Can't remember reaction Has patient had a PCN reaction causing immediate rash, facial/tongue/throat swelling, SOB or lightheadedness with hypotension: No Has patient had a PCN reaction causing severe rash involving mucus membranes or skin necrosis:  No Has patient had a PCN reaction that required hospitalization: No Has patient had a PCN reaction occurring within the last 10 years: No If all of the above answers are "NO", then may proceed with Cephalosporin use.   Marland Kitchen Rofecoxib     Vioxx  REACTION: increase BP    Social History   Socioeconomic History  . Marital status: Married    Spouse name: Jeani Hawking  . Number of children: 2  . Years of education: Not on file  . Highest education level: Not on file  Occupational History  . Occupation: MAINTENANCE    Employer: [employer]  Tobacco Use  . Smoking status: Former Smoker    Packs/day: 1.00    Years: 1.00    Pack years: 1.00    Types: Cigarettes    Quit date: 07/18/1980    Years since quitting: 39.5  . Smokeless tobacco: Never Used  Vaping Use  . Vaping Use: Never used  Substance and Sexual Activity  . Alcohol use: No    Alcohol/week: 0.0 standard drinks  . Drug use: No  . Sexual activity: Yes  Other Topics Concern  . Not on file  Social History Narrative  . Not on file   Social Determinants of Health   Financial Resource Strain:   . Difficulty of Paying Living Expenses:   Food Insecurity:   . Worried About Charity fundraiser in the Last Year:   . Arboriculturist in the Last Year:   Transportation Needs:   . Film/video editor (Medical):   Marland Kitchen Lack of Transportation (Non-Medical):   Physical Activity:   . Days of Exercise per Week:   . Minutes of Exercise per Session:   Stress:   . Feeling of Stress :   Social Connections:   . Frequency of Communication with Friends and Family:   . Frequency of Social Gatherings with Friends and Family:   . Attends Religious Services:   . Active Member of Clubs or Organizations:   . Attends Archivist Meetings:   Marland Kitchen Marital Status:   Intimate Partner Violence:   . Fear of Current or Ex-Partner:   . Emotionally Abused:   Marland Kitchen Physically Abused:   . Sexually Abused:     Family History  Problem Relation Age of  Onset  . Heart disease Maternal Grandmother   . Heart disease Mother   . Cancer Paternal Grandmother        unsure what kind  . Lung cancer Father   . Colon cancer Neg Hx   . Esophageal cancer Neg Hx   . Rectal  cancer Neg Hx   . Stomach cancer Neg Hx     ROS- All systems are reviewed and negative except as per the HPI above  Physical Exam: Vitals:   01/08/20 0827  BP: (!) 150/88  Pulse: 72  Weight: (!) 167.1 kg  Height: 6\' 1"  (1.854 m)   Wt Readings from Last 3 Encounters:  01/08/20 (!) 167.1 kg  01/01/20 (!) 167.3 kg  12/18/17 (!) 179.4 kg    Labs: Lab Results  Component Value Date   NA 141 01/20/2011   K 4.7 01/20/2011   CL 104 01/20/2011   CO2 30 01/20/2011   GLUCOSE 186 (H) 01/20/2011   BUN 26 (H) 01/20/2011   CREATININE 1.39 (H) 01/20/2011   CALCIUM 9.4 01/20/2011   No results found for: INR No results found for: CHOL, HDL, LDLCALC, TRIG   GEN- The patient is well appearing, alert and oriented x 3 today.   Head- normocephalic, atraumatic Eyes-  Sclera clear, conjunctiva pink Ears- hearing intact Oropharynx- clear Neck- supple, no JVP Lymph- no cervical lymphadenopathy Lungs- Clear to ausculation bilaterally, normal work of breathing Heart- irregular rate and rhythm, no murmurs, rubs or gallops, PMI not laterally displaced GI- soft, NT, ND, + BS Extremities- no clubbing, cyanosis, or edema MS- no significant deformity or atrophy Skin- no rash or lesion Psych- euthymic mood, full affect Neuro- strength and sensation are intact  EKG- afib at 72  bpm with a few sinus beats PCP records reviewed     Assessment and Plan: 1. New onset persistent afib Continue metoprolol tartrate 25 mg 1/2 tab for  rate control  I see a few sinus beats today so hopeful he may return to SR  Weight  loss encouraged  Will  need echo when returns to SR  Encouraged to have cpap machine interrogated to see if pressures are adequate to treat sleep apnea      2.  CHA2DS2VASc score of at least 3 Continue eliquis 5 mg bid  Bleeding risk and precautions discussed  Discussed with pt with his stroke risk of 3 , he will need to stay on anticoagulation long term  He has some hesitation re cost of eliquis but does not want to take warfarin He may be able to get assistance from  the drug company once he pays 3% of his income out of pocket for the rest of the year  F/u in 14 days  [place]. Kem Hensen, [facility] [address] [place], [place] [address] [phone]

## 2020-01-21 ENCOUNTER — Encounter (HOSPITAL_COMMUNITY): Payer: Self-pay | Admitting: Nurse Practitioner

## 2020-01-21 ENCOUNTER — Other Ambulatory Visit: Payer: Self-pay

## 2020-01-21 ENCOUNTER — Ambulatory Visit (HOSPITAL_COMMUNITY)
Admission: RE | Admit: 2020-01-21 | Discharge: 2020-01-21 | Disposition: A | Discharge status: Home / Self Care | Payer: Medicare Other | Source: Ambulatory Visit | Attending: Nurse Practitioner | Admitting: Nurse Practitioner

## 2020-01-21 VITALS — BP 150/80 | HR 79 | Ht 73.0 in | Wt 367.8 lb

## 2020-01-21 DIAGNOSIS — E1136 Type 2 diabetes mellitus with diabetic cataract: Secondary | ICD-10-CM | POA: Diagnosis not present

## 2020-01-21 DIAGNOSIS — Z7901 Long term (current) use of anticoagulants: Secondary | ICD-10-CM | POA: Diagnosis not present

## 2020-01-21 DIAGNOSIS — E785 Hyperlipidemia, unspecified: Secondary | ICD-10-CM | POA: Insufficient documentation

## 2020-01-21 DIAGNOSIS — Z7984 Long term (current) use of oral hypoglycemic drugs: Secondary | ICD-10-CM | POA: Diagnosis not present

## 2020-01-21 DIAGNOSIS — I4891 Unspecified atrial fibrillation: Secondary | ICD-10-CM | POA: Diagnosis not present

## 2020-01-21 DIAGNOSIS — I4819 Other persistent atrial fibrillation: Secondary | ICD-10-CM | POA: Insufficient documentation

## 2020-01-21 DIAGNOSIS — Z880 Allergy status to penicillin: Secondary | ICD-10-CM | POA: Insufficient documentation

## 2020-01-21 DIAGNOSIS — Z886 Allergy status to analgesic agent status: Secondary | ICD-10-CM | POA: Diagnosis not present

## 2020-01-21 DIAGNOSIS — Z87891 Personal history of nicotine dependence: Secondary | ICD-10-CM | POA: Diagnosis not present

## 2020-01-21 DIAGNOSIS — D6869 Other thrombophilia: Secondary | ICD-10-CM

## 2020-01-21 DIAGNOSIS — D509 Iron deficiency anemia, unspecified: Secondary | ICD-10-CM | POA: Diagnosis not present

## 2020-01-21 DIAGNOSIS — Z79890 Hormone replacement therapy (postmenopausal): Secondary | ICD-10-CM | POA: Insufficient documentation

## 2020-01-21 DIAGNOSIS — E039 Hypothyroidism, unspecified: Secondary | ICD-10-CM | POA: Diagnosis not present

## 2020-01-21 DIAGNOSIS — I10 Essential (primary) hypertension: Secondary | ICD-10-CM | POA: Insufficient documentation

## 2020-01-21 DIAGNOSIS — M1990 Unspecified osteoarthritis, unspecified site: Secondary | ICD-10-CM | POA: Diagnosis not present

## 2020-01-21 DIAGNOSIS — K219 Gastro-esophageal reflux disease without esophagitis: Secondary | ICD-10-CM | POA: Insufficient documentation

## 2020-01-21 DIAGNOSIS — J309 Allergic rhinitis, unspecified: Secondary | ICD-10-CM | POA: Diagnosis not present

## 2020-01-21 DIAGNOSIS — Z79899 Other long term (current) drug therapy: Secondary | ICD-10-CM | POA: Diagnosis not present

## 2020-01-21 DIAGNOSIS — Z8711 Personal history of peptic ulcer disease: Secondary | ICD-10-CM | POA: Insufficient documentation

## 2020-01-21 DIAGNOSIS — G4730 Sleep apnea, unspecified: Secondary | ICD-10-CM | POA: Insufficient documentation

## 2020-01-21 DIAGNOSIS — Z8249 Family history of ischemic heart disease and other diseases of the circulatory system: Secondary | ICD-10-CM | POA: Diagnosis not present

## 2020-01-21 DIAGNOSIS — G4733 Obstructive sleep apnea (adult) (pediatric): Secondary | ICD-10-CM | POA: Insufficient documentation

## 2020-01-21 DIAGNOSIS — E669 Obesity, unspecified: Secondary | ICD-10-CM | POA: Diagnosis not present

## 2020-01-21 DIAGNOSIS — H269 Unspecified cataract: Secondary | ICD-10-CM | POA: Diagnosis not present

## 2020-01-21 DIAGNOSIS — Z8719 Personal history of other diseases of the digestive system: Secondary | ICD-10-CM | POA: Diagnosis not present

## 2020-01-21 LAB — CBC
HCT: 44.5 % (ref 39.0–52.0)
Hemoglobin: 14.2 g/dL (ref 13.0–17.0)
MCH: 27.6 pg (ref 26.0–34.0)
MCHC: 31.9 g/dL (ref 30.0–36.0)
MCV: 86.6 fL (ref 80.0–100.0)
Platelets: 201 10*3/uL (ref 150–400)
RBC: 5.14 MIL/uL (ref 4.22–5.81)
RDW: 14.3 % (ref 11.5–15.5)
WBC: 7.6 10*3/uL (ref 4.0–10.5)
nRBC: 0 % (ref 0.0–0.2)

## 2020-01-21 LAB — BASIC METABOLIC PANEL
Anion gap: 13 (ref 5–15)
BUN: 24 mg/dL — ABNORMAL HIGH (ref 8–23)
CO2: 20 mmol/L — ABNORMAL LOW (ref 22–32)
Calcium: 9.2 mg/dL (ref 8.9–10.3)
Chloride: 104 mmol/L (ref 98–111)
Creatinine, Ser: 1.11 mg/dL (ref 0.61–1.24)
GFR calc Af Amer: >60 mL/min (ref >60)
GFR calc non Af Amer: >60 mL/min (ref >60)
Glucose, Bld: 156 mg/dL — ABNORMAL HIGH (ref 70–99)
Potassium: 4.4 mmol/L (ref 3.5–5.1)
Sodium: 137 mmol/L (ref 135–145)

## 2020-01-21 NOTE — Patient Instructions (Signed)
Cardioversion scheduled for Wednesday, July 14th  - Arrive at the [facility] and go to admitting at [facility] not eat or drink anything after midnight the night prior to your procedure.  - Take all your morning medication (except diabetic medications) with a sip of water prior to arrival.  - You will not be able to drive home after your procedure.  - Do NOT miss any doses of your blood thinner - if you should miss a dose please notify our office immediately.

## 2020-01-21 NOTE — Progress Notes (Signed)
Primary Care Physician: Evan Infante, MD Referring Physician: Dr. [name] is a 64 y.o. male with a h/o obesity, HTN, DM, is in the afib cliinc for afib that was recently seen on EKG and still present on EKG today. Pt feels it may have been going on for a few months as he has felt different. He was ordered Eliquis yesterday but he did not pick it up as it is a brand drug, costing over $100 a month and he is in the do nut hole from  being on expensive DM meds the first of the year,  which he has since  stopped. He  is rate  controlled today. He does not drink, nor smoke, moderate caffeine use . He  does use  cpap for years but stopped going to the sleep MD as he did not care for him and this has not been checked in some time to see if pressures are adequate. He has had a remote echo.   F/u in the afib clinic, 6/23. EKG does show a few sinus beats but it still looks more like probable afib., rate controlled in the 70's. He is tolerating  the BB and eliquis, but has noted that he wakes up now every morning around 4 am.he has cut back on his mountain dew and is planning to go to decaf with his current supply is out.   F/u in afib clinic, he remains in afib rate controlled. He has satisfied the 21 day requirement for uninterrupted anticoagulation and wishes to go ahead with cardioversion.   Today, he denies symptoms of palpitations, chest pain, shortness of breath, orthopnea, PND, lower extremity edema, dizziness, presyncope, syncope, or neurologic sequela. The patient is tolerating medications without difficulties and is otherwise without complaint today.   Past Medical History:  Diagnosis Date  . Allergic rhinitis   . Arthritis   . Blood transfusion without reported diagnosis   . Cataract   . Chronic kidney disease    pt. denies  . Diabetes ([place])    type 2  . Dyspnea on exertion   . GERD (gastroesophageal reflux disease)   . History of hiatal hernia   . Hyperlipidemia   .  Hypertension   . Hypothyroidism   . Iron deficiency anemia   . OSA (obstructive sleep apnea)   . PUD (peptic ulcer disease)   . Sleep apnea    WEARS CPAP   Past Surgical History:  Procedure Laterality Date  . BACK SURGERY  11/2008   x2  . bleeding ulcer surgery    . CHOLECYSTECTOMY  1998  . COLONOSCOPY    . ESOPHAGOGASTRODUODENOSCOPY (EGD) WITH PROPOFOL N/A 12/18/2017   Procedure: ESOPHAGOGASTRODUODENOSCOPY (EGD) WITH PROPOFOL;  Surgeon: Ladene Artist, MD;  Location: [facility];  Service: Endoscopy;  Laterality: N/A;  . GASTRIC BYPASS  1983  . GASTRIC RESTRICTION SURGERY    . POLYPECTOMY  12/18/2017   Procedure: POLYPECTOMY;  Surgeon: Ladene Artist, MD;  Location: [facility];  Service: Endoscopy;;    Current Outpatient Medications  Medication Sig Dispense Refill  . apixaban (ELIQUIS) 5 MG TABS tablet Take 1 tablet (5 mg total) by mouth 2 (two) times daily. 60 tablet 3  . levothyroxine (SYNTHROID, LEVOTHROID) 150 MCG tablet Take 150 mcg by mouth daily.      . metFORMIN (GLUCOPHAGE-XR) 500 MG 24 hr tablet Take 1,000 mg by mouth 2 (two) times daily.  3  . metoprolol tartrate (LOPRESSOR) 25 MG tablet  Take 0.5 tablets (12.5 mg total) by mouth 2 (two) times daily. 30 tablet 3  . Multiple Vitamin (MULTIVITAMIN) capsule Take 1 capsule by mouth daily.      . Omega-3 Fatty Acids (FISH OIL) 1200 MG CAPS Take 1,200 mg by mouth daily.    Marland Kitchen omeprazole (PRILOSEC) 20 MG capsule TAKE 1 CAPSULE (20 MG TOTAL) BY MOUTH 2 (TWO) TIMES DAILY BEFORE A MEAL. (Patient taking differently: Take 20 mg by mouth daily. ) 180 capsule 3  . ONETOUCH VERIO test strip     . pravastatin (PRAVACHOL) 20 MG tablet Take 10 mg by mouth daily.     . ramipril (ALTACE) 10 MG capsule Take 10 mg by mouth daily.      No current facility-administered medications for this encounter.    Allergies  Allergen Reactions  . Penicillins     Can't remember reaction Has patient had a PCN reaction causing immediate rash,  facial/tongue/throat swelling, SOB or lightheadedness with hypotension: No Has patient had a PCN reaction causing severe rash involving mucus membranes or skin necrosis: No Has patient had a PCN reaction that required hospitalization: No Has patient had a PCN reaction occurring within the last 10 years: No If all of the above answers are "NO", then may proceed with Cephalosporin use.   Marland Kitchen Rofecoxib     Vioxx  REACTION: increase BP    Social History   Socioeconomic History  . Marital status: Married    Spouse name: Evan Berry  . Number of children: 2  . Years of education: Not on file  . Highest education level: Not on file  Occupational History  . Occupation: MAINTENANCE    Employer: [employer]  Tobacco Use  . Smoking status: Former Smoker    Packs/day: 1.00    Years: 1.00    Pack years: 1.00    Types: Cigarettes    Quit date: 07/18/1980    Years since quitting: 39.5  . Smokeless tobacco: Never Used  Vaping Use  . Vaping Use: Never used  Substance and Sexual Activity  . Alcohol use: No    Alcohol/week: 0.0 standard drinks  . Drug use: No  . Sexual activity: Yes  Other Topics Concern  . Not on file  Social History Narrative  . Not on file   Social Determinants of Health   Financial Resource Strain:   . Difficulty of Paying Living Expenses:   Food Insecurity:   . Worried About Charity fundraiser in the Last Year:   . Arboriculturist in the Last Year:   Transportation Needs:   . Film/video editor (Medical):   Marland Kitchen Lack of Transportation (Non-Medical):   Physical Activity:   . Days of Exercise per Week:   . Minutes of Exercise per Session:   Stress:   . Feeling of Stress :   Social Connections:   . Frequency of Communication with Friends and Family:   . Frequency of Social Gatherings with Friends and Family:   . Attends Religious Services:   . Active Member of Clubs or Organizations:   . Attends Archivist Meetings:   Marland Kitchen Marital Status:   Intimate  Partner Violence:   . Fear of Current or Ex-Partner:   . Emotionally Abused:   Marland Kitchen Physically Abused:   . Sexually Abused:     Family History  Problem Relation Age of Onset  . Heart disease Maternal Grandmother   . Heart disease Mother   . Cancer Paternal Grandmother  unsure what kind  . Lung cancer Father   . Colon cancer Neg Hx   . Esophageal cancer Neg Hx   . Rectal cancer Neg Hx   . Stomach cancer Neg Hx     ROS- All systems are reviewed and negative except as per the HPI above  Physical Exam: Vitals:   01/21/20 0835  Weight: (!) 166.8 kg  Height: 6\' 1"  (1.854 m)   Wt Readings from Last 3 Encounters:  01/21/20 (!) 166.8 kg  01/08/20 (!) 167.1 kg  01/01/20 (!) 167.3 kg    Labs: Lab Results  Component Value Date   NA 141 01/20/2011   K 4.7 01/20/2011   CL 104 01/20/2011   CO2 30 01/20/2011   GLUCOSE 186 (H) 01/20/2011   BUN 26 (H) 01/20/2011   CREATININE 1.39 (H) 01/20/2011   CALCIUM 9.4 01/20/2011   No results found for: INR No results found for: CHOL, HDL, LDLCALC, TRIG   GEN- The patient is well appearing, alert and oriented x 3 today.   Head- normocephalic, atraumatic Eyes-  Sclera clear, conjunctiva pink Ears- hearing intact Oropharynx- clear Neck- supple, no JVP Lymph- no cervical lymphadenopathy Lungs- Clear to ausculation bilaterally, normal work of breathing Heart- irregular rate and rhythm, no murmurs, rubs or gallops, PMI not laterally displaced GI- soft, NT, ND, + BS Extremities- no clubbing, cyanosis, or edema MS- no significant deformity or atrophy Skin- no rash or lesion Psych- euthymic mood, full affect Neuro- strength and sensation are intact  EKG- afib at 79, qrs int 88 ms, qtc 433 ms  PCP records reviewed     Assessment and Plan: 1. New onset persistent afib Risk vrs benefit of cardioversion discussed with pt and he wishes to proceed, scheduled 01/29/20 Continue metoprolol tartrate 25 mg 1/2 tab for  rate control  Will   need echo when returns to SR  Encouraged to have cpap machine interrogated to see if pressures are adequate to treat sleep apnea      2. CHA2DS2VASc score of at least 3 Continue eliquis 5 mg bid, uninterrupted use since 6/18   F/u one week after cardioversion   Butch Penny C. Wava Kild[place], [facility] [address] [place], [place] [address] [phone]

## 2020-01-21 NOTE — H&P (View-Only) (Signed)
Primary Care Physician: Crist Infante, MD Referring Physician: Dr. Claudell Kyle is a 64 y.o. male with a h/o obesity, HTN, DM, is in the afib cliinc for afib that was recently seen on EKG and still present on EKG today. Pt feels it may have been going on for a few months as he has felt different. He was ordered Eliquis yesterday but he did not pick it up as it is a brand drug, costing over $100 a month and he is in the do nut hole from  being on expensive DM meds the first of the year,  which he has since  stopped. He  is rate  controlled today. He does not drink, nor smoke, moderate caffeine use . He  does use  cpap for years but stopped going to the sleep MD as he did not care for him and this has not been checked in some time to see if pressures are adequate. He has had a remote echo.   F/u in the afib clinic, 6/23. EKG does show a few sinus beats but it still looks more like probable afib., rate controlled in the 70's. He is tolerating  the BB and eliquis, but has noted that he wakes up now every morning around 4 am.he has cut back on his mountain dew and is planning to go to decaf with his current supply is out.   F/u in afib clinic, he remains in afib rate controlled. He has satisfied the 21 day requirement for uninterrupted anticoagulation and wishes to go ahead with cardioversion.   Today, he denies symptoms of palpitations, chest pain, shortness of breath, orthopnea, PND, lower extremity edema, dizziness, presyncope, syncope, or neurologic sequela. The patient is tolerating medications without difficulties and is otherwise without complaint today.   Past Medical History:  Diagnosis Date  . Allergic rhinitis   . Arthritis   . Blood transfusion without reported diagnosis   . Cataract   . Chronic kidney disease    pt. denies  . Diabetes ([place])    type 2  . Dyspnea on exertion   . GERD (gastroesophageal reflux disease)   . History of hiatal hernia   . Hyperlipidemia   .  Hypertension   . Hypothyroidism   . Iron deficiency anemia   . OSA (obstructive sleep apnea)   . PUD (peptic ulcer disease)   . Sleep apnea    WEARS CPAP   Past Surgical History:  Procedure Laterality Date  . BACK SURGERY  11/2008   x2  . bleeding ulcer surgery    . CHOLECYSTECTOMY  1998  . COLONOSCOPY    . ESOPHAGOGASTRODUODENOSCOPY (EGD) WITH PROPOFOL N/A 12/18/2017   Procedure: ESOPHAGOGASTRODUODENOSCOPY (EGD) WITH PROPOFOL;  Surgeon: Ladene Artist, MD;  Location: [facility];  Service: Endoscopy;  Laterality: N/A;  . GASTRIC BYPASS  1983  . GASTRIC RESTRICTION SURGERY    . POLYPECTOMY  12/18/2017   Procedure: POLYPECTOMY;  Surgeon: Ladene Artist, MD;  Location: [facility];  Service: Endoscopy;;    Current Outpatient Medications  Medication Sig Dispense Refill  . apixaban (ELIQUIS) 5 MG TABS tablet Take 1 tablet (5 mg total) by mouth 2 (two) times daily. 60 tablet 3  . levothyroxine (SYNTHROID, LEVOTHROID) 150 MCG tablet Take 150 mcg by mouth daily.      . metFORMIN (GLUCOPHAGE-XR) 500 MG 24 hr tablet Take 1,000 mg by mouth 2 (two) times daily.  3  . metoprolol tartrate (LOPRESSOR) 25 MG tablet  Take 0.5 tablets (12.5 mg total) by mouth 2 (two) times daily. 30 tablet 3  . Multiple Vitamin (MULTIVITAMIN) capsule Take 1 capsule by mouth daily.      . Omega-3 Fatty Acids (FISH OIL) 1200 MG CAPS Take 1,200 mg by mouth daily.    Marland Kitchen omeprazole (PRILOSEC) 20 MG capsule TAKE 1 CAPSULE (20 MG TOTAL) BY MOUTH 2 (TWO) TIMES DAILY BEFORE A MEAL. (Patient taking differently: Take 20 mg by mouth daily. ) 180 capsule 3  . ONETOUCH VERIO test strip     . pravastatin (PRAVACHOL) 20 MG tablet Take 10 mg by mouth daily.     . ramipril (ALTACE) 10 MG capsule Take 10 mg by mouth daily.      No current facility-administered medications for this encounter.    Allergies  Allergen Reactions  . Penicillins     Can't remember reaction Has patient had a PCN reaction causing immediate rash,  facial/tongue/throat swelling, SOB or lightheadedness with hypotension: No Has patient had a PCN reaction causing severe rash involving mucus membranes or skin necrosis: No Has patient had a PCN reaction that required hospitalization: No Has patient had a PCN reaction occurring within the last 10 years: No If all of the above answers are "NO", then may proceed with Cephalosporin use.   Marland Kitchen Rofecoxib     Vioxx  REACTION: increase BP    Social History   Socioeconomic History  . Marital status: Married    Spouse name: Jeani Hawking  . Number of children: 2  . Years of education: Not on file  . Highest education level: Not on file  Occupational History  . Occupation: MAINTENANCE    Employer: [employer]  Tobacco Use  . Smoking status: Former Smoker    Packs/day: 1.00    Years: 1.00    Pack years: 1.00    Types: Cigarettes    Quit date: 07/18/1980    Years since quitting: 39.5  . Smokeless tobacco: Never Used  Vaping Use  . Vaping Use: Never used  Substance and Sexual Activity  . Alcohol use: No    Alcohol/week: 0.0 standard drinks  . Drug use: No  . Sexual activity: Yes  Other Topics Concern  . Not on file  Social History Narrative  . Not on file   Social Determinants of Health   Financial Resource Strain:   . Difficulty of Paying Living Expenses:   Food Insecurity:   . Worried About Charity fundraiser in the Last Year:   . Arboriculturist in the Last Year:   Transportation Needs:   . Film/video editor (Medical):   Marland Kitchen Lack of Transportation (Non-Medical):   Physical Activity:   . Days of Exercise per Week:   . Minutes of Exercise per Session:   Stress:   . Feeling of Stress :   Social Connections:   . Frequency of Communication with Friends and Family:   . Frequency of Social Gatherings with Friends and Family:   . Attends Religious Services:   . Active Member of Clubs or Organizations:   . Attends Archivist Meetings:   Marland Kitchen Marital Status:   Intimate  Partner Violence:   . Fear of Current or Ex-Partner:   . Emotionally Abused:   Marland Kitchen Physically Abused:   . Sexually Abused:     Family History  Problem Relation Age of Onset  . Heart disease Maternal Grandmother   . Heart disease Mother   . Cancer Paternal Grandmother  unsure what kind  . Lung cancer Father   . Colon cancer Neg Hx   . Esophageal cancer Neg Hx   . Rectal cancer Neg Hx   . Stomach cancer Neg Hx     ROS- All systems are reviewed and negative except as per the HPI above  Physical Exam: Vitals:   01/21/20 0835  Weight: (!) 166.8 kg  Height: 6\' 1"  (1.854 m)   Wt Readings from Last 3 Encounters:  01/21/20 (!) 166.8 kg  01/08/20 (!) 167.1 kg  01/01/20 (!) 167.3 kg    Labs: Lab Results  Component Value Date   NA 141 01/20/2011   K 4.7 01/20/2011   CL 104 01/20/2011   CO2 30 01/20/2011   GLUCOSE 186 (H) 01/20/2011   BUN 26 (H) 01/20/2011   CREATININE 1.39 (H) 01/20/2011   CALCIUM 9.4 01/20/2011   No results found for: INR No results found for: CHOL, HDL, LDLCALC, TRIG   GEN- The patient is well appearing, alert and oriented x 3 today.   Head- normocephalic, atraumatic Eyes-  Sclera clear, conjunctiva pink Ears- hearing intact Oropharynx- clear Neck- supple, no JVP Lymph- no cervical lymphadenopathy Lungs- Clear to ausculation bilaterally, normal work of breathing Heart- irregular rate and rhythm, no murmurs, rubs or gallops, PMI not laterally displaced GI- soft, NT, ND, + BS Extremities- no clubbing, cyanosis, or edema MS- no significant deformity or atrophy Skin- no rash or lesion Psych- euthymic mood, full affect Neuro- strength and sensation are intact  EKG- afib at 79, qrs int 88 ms, qtc 433 ms  PCP records reviewed     Assessment and Plan: 1. New onset persistent afib Risk vrs benefit of cardioversion discussed with pt and he wishes to proceed, scheduled 01/29/20 Continue metoprolol tartrate 25 mg 1/2 tab for  rate control  Will   need echo when returns to [facility]  Encouraged to have cpap machine interrogated to see if pressures are adequate to treat sleep apnea      2. CHA2DS2VASc score of at least 3 Continue eliquis 5 mg bid, uninterrupted use since 6/18   F/u one week after cardioversion   Butch Penny C. Treva Huyett, [facility] [address] [place], [place] [address] [phone]

## 2020-01-27 ENCOUNTER — Other Ambulatory Visit (HOSPITAL_COMMUNITY)
Admission: RE | Admit: 2020-01-27 | Discharge: 2020-01-27 | Disposition: A | Discharge status: Home / Self Care | Payer: Medicare Other | Source: Ambulatory Visit | Attending: Cardiovascular Disease | Admitting: Cardiovascular Disease

## 2020-01-27 DIAGNOSIS — Z20822 Contact with and (suspected) exposure to covid-19: Secondary | ICD-10-CM | POA: Insufficient documentation

## 2020-01-27 DIAGNOSIS — Z01812 Encounter for preprocedural laboratory examination: Secondary | ICD-10-CM | POA: Insufficient documentation

## 2020-01-27 LAB — SARS CORONAVIRUS 2 (TAT 6-24 HRS): SARS Coronavirus 2: NEGATIVE

## 2020-01-29 ENCOUNTER — Ambulatory Visit (HOSPITAL_COMMUNITY)
Admission: RE | Admit: 2020-01-29 | Discharge: 2020-01-29 | Disposition: A | Discharge status: Home / Self Care | Payer: Medicare Other | Attending: Cardiovascular Disease | Admitting: Cardiovascular Disease

## 2020-01-29 ENCOUNTER — Ambulatory Visit (HOSPITAL_COMMUNITY): Payer: Medicare Other | Admitting: Certified Registered Nurse Anesthetist

## 2020-01-29 ENCOUNTER — Other Ambulatory Visit: Payer: Self-pay

## 2020-01-29 ENCOUNTER — Encounter (HOSPITAL_COMMUNITY)
Admission: RE | Disposition: A | Discharge status: Home / Self Care | Payer: Self-pay | Source: Home / Self Care | Attending: Cardiovascular Disease

## 2020-01-29 ENCOUNTER — Encounter (HOSPITAL_COMMUNITY): Payer: Self-pay | Admitting: Cardiovascular Disease

## 2020-01-29 DIAGNOSIS — Z87891 Personal history of nicotine dependence: Secondary | ICD-10-CM | POA: Diagnosis not present

## 2020-01-29 DIAGNOSIS — Z7901 Long term (current) use of anticoagulants: Secondary | ICD-10-CM | POA: Diagnosis not present

## 2020-01-29 DIAGNOSIS — Z79890 Hormone replacement therapy (postmenopausal): Secondary | ICD-10-CM | POA: Diagnosis not present

## 2020-01-29 DIAGNOSIS — K219 Gastro-esophageal reflux disease without esophagitis: Secondary | ICD-10-CM | POA: Diagnosis not present

## 2020-01-29 DIAGNOSIS — I10 Essential (primary) hypertension: Secondary | ICD-10-CM | POA: Diagnosis not present

## 2020-01-29 DIAGNOSIS — I4819 Other persistent atrial fibrillation: Secondary | ICD-10-CM | POA: Diagnosis present

## 2020-01-29 DIAGNOSIS — E119 Type 2 diabetes mellitus without complications: Secondary | ICD-10-CM | POA: Diagnosis not present

## 2020-01-29 DIAGNOSIS — E669 Obesity, unspecified: Secondary | ICD-10-CM | POA: Insufficient documentation

## 2020-01-29 DIAGNOSIS — Z79899 Other long term (current) drug therapy: Secondary | ICD-10-CM | POA: Insufficient documentation

## 2020-01-29 DIAGNOSIS — E785 Hyperlipidemia, unspecified: Secondary | ICD-10-CM | POA: Diagnosis not present

## 2020-01-29 DIAGNOSIS — Z6841 Body Mass Index (BMI) 40.0 and over, adult: Secondary | ICD-10-CM | POA: Diagnosis not present

## 2020-01-29 DIAGNOSIS — G4733 Obstructive sleep apnea (adult) (pediatric): Secondary | ICD-10-CM | POA: Diagnosis not present

## 2020-01-29 DIAGNOSIS — E039 Hypothyroidism, unspecified: Secondary | ICD-10-CM | POA: Diagnosis not present

## 2020-01-29 DIAGNOSIS — D509 Iron deficiency anemia, unspecified: Secondary | ICD-10-CM | POA: Diagnosis not present

## 2020-01-29 HISTORY — PX: CARDIOVERSION: SHX1299

## 2020-01-29 LAB — GLUCOSE, CAPILLARY: Glucose-Capillary: 155 mg/dL — ABNORMAL HIGH (ref 70–99)

## 2020-01-29 SURGERY — CARDIOVERSION
Anesthesia: General

## 2020-01-29 MED ORDER — PROPOFOL 10 MG/ML IV BOLUS
INTRAVENOUS | Status: DC | PRN
  Administered 2020-01-29: 80 mg via INTRAVENOUS

## 2020-01-29 MED ORDER — SODIUM CHLORIDE 0.9 % IV SOLN
INTRAVENOUS | Status: DC | PRN
Start: 2020-01-29 — End: 2020-01-29

## 2020-01-29 MED ORDER — LIDOCAINE 2% (20 MG/ML) 5 ML SYRINGE
INTRAMUSCULAR | Status: DC | PRN
  Administered 2020-01-29: 100 mg via INTRAVENOUS

## 2020-01-29 NOTE — Op Note (Signed)
Procedure: Electrical Cardioversion Indications:  Atrial Fibrillation  Procedure Details:  Consent: Risks of procedure as well as the alternatives and risks of each were explained to the (patient/caregiver).  Consent for procedure obtained.  Time Out: Verified patient identification, verified procedure, site/side was marked, verified correct patient position, special equipment/implants available, medications/allergies/relevent history reviewed, required imaging and test results available.  Performed  Patient placed on cardiac monitor, pulse oximetry, supplemental oxygen as necessary.  Sedation given: propofol 90 mg IV, Dr. Conrad Edison Pacer pads placed anterior and posterior chest.  Cardioverted 1 time(s).  Cardioversion with synchronized biphasic 200J shock.  Evaluation: Findings: Post procedure EKG shows: NSR Complications: None Patient did tolerate procedure well.  Time Spent Directly with the Patient:  30  minutes   Bronnie Vasseur 01/29/2020, 9:16 AM

## 2020-01-29 NOTE — Discharge Instructions (Signed)
Electrical Cardioversion Electrical cardioversion is the delivery of a jolt of electricity to restore a normal rhythm to the heart. A rhythm that is too fast or is not regular keeps the heart from pumping well. In this procedure, sticky patches or metal paddles are placed on the chest to deliver electricity to the heart from a device. This procedure may be done in an emergency if:  There is low or no blood pressure as a result of the heart rhythm.  Normal rhythm must be restored as fast as possible to protect the brain and heart from further damage.  It may save a life. This may also be a scheduled procedure for irregular or fast heart rhythms that are not immediately life-threatening. Tell a health care provider about:  Any allergies you have.  All medicines you are taking, including vitamins, herbs, eye drops, creams, and over-the-counter medicines.  Any problems you or family members have had with anesthetic medicines.  Any blood disorders you have.  Any surgeries you have had.  Any medical conditions you have.  Whether you are pregnant or may be pregnant. What are the risks? Generally, this is a safe procedure. However, problems may occur, including:  Allergic reactions to medicines.  A blood clot that breaks free and travels to other parts of your body.  The possible return of an abnormal heart rhythm within hours or days after the procedure.  Your heart stopping (cardiac arrest). This is rare. What happens before the procedure? Medicines  Your health care provider may have you start taking: ? Blood-thinning medicines (anticoagulants) so your blood does not clot as easily. ? Medicines to help stabilize your heart rate and rhythm.  Ask your health care provider about: ? Changing or stopping your regular medicines. This is especially important if you are taking diabetes medicines or blood thinners. ? Taking medicines such as aspirin and ibuprofen. These medicines can  thin your blood. Do not take these medicines unless your health care provider tells you to take them. ? Taking over-the-counter medicines, vitamins, herbs, and supplements. General instructions  Follow instructions from your health care provider about eating or drinking restrictions.  Plan to have someone take you home from the hospital or clinic.  If you will be going home right after the procedure, plan to have someone with you for 24 hours.  Ask your health care provider what steps will be taken to help prevent infection. These may include washing your skin with a germ-killing soap. What happens during the procedure?   An IV will be inserted into one of your veins.  Sticky patches (electrodes) or metal paddles may be placed on your chest.  You will be given a medicine to help you relax (sedative).  An electrical shock will be delivered. The procedure may vary among health care providers and hospitals. What can I expect after the procedure?  Your blood pressure, heart rate, breathing rate, and blood oxygen level will be monitored until you leave the hospital or clinic.  Your heart rhythm will be watched to make sure it does not change.  You may have some redness on the skin where the shocks were given. Follow these instructions at home:  Do not drive for 24 hours if you were given a sedative during your procedure.  Take over-the-counter and prescription medicines only as told by your health care provider.  Ask your health care provider how to check your pulse. Check it often.  Rest for 48 hours after the procedure or   as told by your health care provider.  Avoid or limit your caffeine use as told by your health care provider.  Keep all follow-up visits as told by your health care provider. This is important. Contact a health care provider if:  You feel like your heart is beating too quickly or your pulse is not regular.  You have a serious muscle cramp that does not go  away. Get help right away if:  You have discomfort in your chest.  You are dizzy or you feel faint.  You have trouble breathing or you are short of breath.  Your speech is slurred.  You have trouble moving an arm or leg on one side of your body.  Your fingers or toes turn cold or blue. Summary  Electrical cardioversion is the delivery of a jolt of electricity to restore a normal rhythm to the heart.  This procedure may be done right away in an emergency or may be a scheduled procedure if the condition is not an emergency.  Generally, this is a safe procedure.  After the procedure, check your pulse often as told by your health care provider. This information is not intended to replace advice given to you by your health care provider. Make sure you discuss any questions you have with your health care provider. Document Revised: 02/04/2019 Document Reviewed: 02/04/2019 Elsevier Patient Education  2020 Elsevier Inc[employer]  

## 2020-01-29 NOTE — Transfer of Care (Signed)
Immediate Anesthesia Transfer of Care Note  Patient: Evan Berry  Procedure(s) Performed: CARDIOVERSION (N/A )  Patient Location: Endoscopy Unit  Anesthesia Type:General  Level of Consciousness: drowsy, patient cooperative and responds to stimulation  Airway & Oxygen Therapy: Patient Spontanous Breathing  Post-op Assessment: Report given to RN and Post -op Vital signs reviewed and stable  Post vital signs: Reviewed and stable  Last Vitals:  Vitals Value Taken Time  BP 135/67 01/29/20 0909  Temp    Pulse 38 01/29/20 0911  Resp 20 01/29/20 0911  SpO2 93 % 01/29/20 0911    Last Pain:  Vitals:   01/29/20 0833  TempSrc: Temporal  PainSc: 0-No pain         Complications: No complications documented.

## 2020-01-29 NOTE — Anesthesia Postprocedure Evaluation (Signed)
Anesthesia Post Note  Patient: Evan Berry  Procedure(s) Performed: CARDIOVERSION (N/A )     Patient location during evaluation: PACU Anesthesia Type: General Level of consciousness: awake and alert Pain management: pain level controlled Vital Signs Assessment: post-procedure vital signs reviewed and stable Respiratory status: spontaneous breathing, nonlabored ventilation, respiratory function stable and patient connected to nasal cannula oxygen Cardiovascular status: blood pressure returned to baseline and stable Postop Assessment: no apparent nausea or vomiting Anesthetic complications: no   No complications documented.  Last Vitals:  Vitals:   01/29/20 0925 01/29/20 0935  BP: (!) 108/59 109/66  Pulse: 67 70  Resp: 19 19  Temp:    SpO2: 95% 95%    Last Pain:  Vitals:   01/29/20 0935  TempSrc:   PainSc: 0-No pain                 Jearline Hirschhorn DAVID

## 2020-01-29 NOTE — Anesthesia Preprocedure Evaluation (Signed)
Anesthesia Evaluation  Patient identified by MRN, date of birth, ID band Patient awake    Reviewed: Allergy & Precautions, NPO status , Patient's Chart, lab work & pertinent test results  Airway Mallampati: II  TM Distance: >3 FB Neck ROM: Full    Dental   Pulmonary sleep apnea , former smoker,    Pulmonary exam normal        Cardiovascular hypertension, Pt. on medications Normal cardiovascular exam     Neuro/Psych    GI/Hepatic GERD  Medicated and Controlled,  Endo/Other  diabetes, Type 2  Renal/GU      Musculoskeletal   Abdominal   Peds  Hematology   Anesthesia Other Findings   Reproductive/Obstetrics                             Anesthesia Physical Anesthesia Plan  ASA: III  Anesthesia Plan: General   Post-op Pain Management:    Induction: Intravenous  PONV Risk Score and Plan: Treatment may vary due to age or medical condition  Airway Management Planned: Mask  Additional Equipment:   Intra-op Plan:   Post-operative Plan:   Informed Consent: I have reviewed the patients History and Physical, chart, labs and discussed the procedure including the risks, benefits and alternatives for the proposed anesthesia with the patient or authorized representative who has indicated his/her understanding and acceptance.       Plan Discussed with: CRNA and Surgeon  Anesthesia Plan Comments:         Anesthesia Quick Evaluation

## 2020-01-29 NOTE — Interval H&P Note (Signed)
History and Physical Interval Note:  01/29/2020 8:33 AM  Evan Berry  has presented today for surgery, with the diagnosis of AFIB.  The various methods of treatment have been discussed with the patient and family. After consideration of risks, benefits and other options for treatment, the patient has consented to  Procedure(s): CARDIOVERSION (N/A) as a surgical intervention.  The patient's history has been reviewed, patient examined, no change in status, stable for surgery.  I have reviewed the patient's chart and labs.  Questions were answered to the patient's satisfaction.     Rector Devonshire

## 2020-01-29 NOTE — Anesthesia Procedure Notes (Signed)
Procedure Name: General with mask airway Date/Time: 01/29/2020 9:06 AM Performed by: Janace Litten, CRNA Pre-anesthesia Checklist: Patient identified, Emergency Drugs available, Suction available and Patient being monitored Patient Re-evaluated:Patient Re-evaluated prior to induction Oxygen Delivery Method: Ambu bag Preoxygenation: Pre-oxygenation with 100% oxygen Induction Type: IV induction

## 2020-01-30 ENCOUNTER — Encounter (HOSPITAL_COMMUNITY): Payer: Self-pay | Admitting: Cardiovascular Disease

## 2020-02-05 ENCOUNTER — Ambulatory Visit (HOSPITAL_COMMUNITY)
Admission: RE | Admit: 2020-02-05 | Discharge: 2020-02-05 | Disposition: A | Discharge status: Home / Self Care | Payer: Medicare Other | Source: Ambulatory Visit | Attending: Nurse Practitioner | Admitting: Nurse Practitioner

## 2020-02-05 ENCOUNTER — Other Ambulatory Visit: Payer: Self-pay

## 2020-02-05 VITALS — BP 138/70 | HR 76 | Ht 73.0 in | Wt 368.2 lb

## 2020-02-05 DIAGNOSIS — Z9989 Dependence on other enabling machines and devices: Secondary | ICD-10-CM | POA: Diagnosis not present

## 2020-02-05 DIAGNOSIS — Z79890 Hormone replacement therapy (postmenopausal): Secondary | ICD-10-CM | POA: Insufficient documentation

## 2020-02-05 DIAGNOSIS — Z79899 Other long term (current) drug therapy: Secondary | ICD-10-CM | POA: Diagnosis not present

## 2020-02-05 DIAGNOSIS — E669 Obesity, unspecified: Secondary | ICD-10-CM | POA: Insufficient documentation

## 2020-02-05 DIAGNOSIS — Z6841 Body Mass Index (BMI) 40.0 and over, adult: Secondary | ICD-10-CM | POA: Insufficient documentation

## 2020-02-05 DIAGNOSIS — I10 Essential (primary) hypertension: Secondary | ICD-10-CM | POA: Insufficient documentation

## 2020-02-05 DIAGNOSIS — Z87891 Personal history of nicotine dependence: Secondary | ICD-10-CM | POA: Insufficient documentation

## 2020-02-05 DIAGNOSIS — E039 Hypothyroidism, unspecified: Secondary | ICD-10-CM | POA: Diagnosis not present

## 2020-02-05 DIAGNOSIS — E785 Hyperlipidemia, unspecified: Secondary | ICD-10-CM | POA: Insufficient documentation

## 2020-02-05 DIAGNOSIS — Z9049 Acquired absence of other specified parts of digestive tract: Secondary | ICD-10-CM | POA: Insufficient documentation

## 2020-02-05 DIAGNOSIS — I4819 Other persistent atrial fibrillation: Secondary | ICD-10-CM

## 2020-02-05 DIAGNOSIS — I4891 Unspecified atrial fibrillation: Secondary | ICD-10-CM | POA: Diagnosis not present

## 2020-02-05 DIAGNOSIS — D6869 Other thrombophilia: Secondary | ICD-10-CM

## 2020-02-05 DIAGNOSIS — Z91120 Patient's intentional underdosing of medication regimen due to financial hardship: Secondary | ICD-10-CM | POA: Insufficient documentation

## 2020-02-05 DIAGNOSIS — G4733 Obstructive sleep apnea (adult) (pediatric): Secondary | ICD-10-CM | POA: Insufficient documentation

## 2020-02-05 DIAGNOSIS — E118 Type 2 diabetes mellitus with unspecified complications: Secondary | ICD-10-CM | POA: Diagnosis not present

## 2020-02-05 DIAGNOSIS — K219 Gastro-esophageal reflux disease without esophagitis: Secondary | ICD-10-CM | POA: Insufficient documentation

## 2020-02-05 NOTE — Progress Notes (Signed)
Primary Care Physician: Crist Infante, MD Referring Physician: Dr. Claudell Kyle is a 64 y.o. male with a h/o obesity, HTN, DM, is in the afib cliinc for afib that was recently seen on EKG and still present on EKG today. Pt feels it may have been going on for a few months as he has felt different. He was ordered Eliquis yesterday but he did not pick it up as it is a brand drug, costing over $100 a month and he is in the do nut hole from  being on expensive DM meds the first of the year,  which he has since  stopped. He  is rate  controlled today. He does not drink, nor smoke, moderate caffeine use . He  does use  cpap for years but stopped going to the sleep MD as he did not care for him and this has not been checked in some time to see if pressures are adequate. He has had a remote echo.   F/u in the afib clinic, 6/23. EKG does show a few sinus beats but it still looks more like probable afib., rate controlled in the 70's. He is tolerating  the BB and eliquis, but has noted that he wakes up now every morning around 4 am.he has cut back on his mountain dew and is planning to go to decaf with his current supply is out.   F/u in afib clinic, he remains in afib rate controlled. He has satisfied the 21 day requirement for uninterrupted anticoagulation and wishes to go ahead with cardioversion.   F/u in afib clinic, 7/21, he did have a successful cardioversion, but unfortunately is back  in  afib with CVR today.  He could not tell any difference. Will go ahead and obtain an echo for future discussions re antiarrhythmics if he wants to pursue restoring SR. Marland Kitchen   Today, he denies symptoms of palpitations, chest pain, shortness of breath, orthopnea, PND, lower extremity edema, dizziness, presyncope, syncope, or neurologic sequela. The patient is tolerating medications without difficulties and is otherwise without complaint today.   Past Medical History:  Diagnosis Date   Allergic rhinitis     Arthritis    Blood transfusion without reported diagnosis    Cataract    Chronic kidney disease    pt. denies   Diabetes ([place])    type 2   Dyspnea on exertion    GERD (gastroesophageal reflux disease)    History of hiatal hernia    Hyperlipidemia    Hypertension    Hypothyroidism    Iron deficiency anemia    OSA (obstructive sleep apnea)    PUD (peptic ulcer disease)    Sleep apnea    WEARS CPAP   Past Surgical History:  Procedure Laterality Date   BACK SURGERY  11/2008   x2   bleeding ulcer surgery     CARDIOVERSION N/A 01/29/2020   Procedure: CARDIOVERSION;  Surgeon: Sanda Klein, MD;  Location: [facility];  Service: Cardiovascular;  Laterality: N/A;   CHOLECYSTECTOMY  1998   COLONOSCOPY     ESOPHAGOGASTRODUODENOSCOPY (EGD) WITH PROPOFOL N/A 12/18/2017   Procedure: ESOPHAGOGASTRODUODENOSCOPY (EGD) WITH PROPOFOL;  Surgeon: Ladene Artist, MD;  Location: [facility];  Service: Endoscopy;  Laterality: N/A;   GASTRIC BYPASS  1983   GASTRIC RESTRICTION SURGERY     POLYPECTOMY  12/18/2017   Procedure: POLYPECTOMY;  Surgeon: Ladene Artist, MD;  Location: [facility];  Service: Endoscopy;;    Current Outpatient  Medications  Medication Sig Dispense Refill   acetaminophen (TYLENOL) 650 MG CR tablet Take 1,300 mg by mouth every 8 (eight) hours as needed for pain.     apixaban (ELIQUIS) 5 MG TABS tablet Take 1 tablet (5 mg total) by mouth 2 (two) times daily. 60 tablet 3   Cyanocobalamin (B-12) 2500 MCG TABS Take 2,500 mcg by mouth daily.     levothyroxine (SYNTHROID, LEVOTHROID) 150 MCG tablet Take 150 mcg by mouth daily.       metFORMIN (GLUCOPHAGE-XR) 500 MG 24 hr tablet Take 1,000 mg by mouth 2 (two) times daily.  3   metoprolol tartrate (LOPRESSOR) 25 MG tablet Take 0.5 tablets (12.5 mg total) by mouth 2 (two) times daily. 30 tablet 3   Multiple Vitamin (MULTIVITAMIN) capsule Take 1 capsule by mouth daily.       Omega-3 Fatty Acids (FISH  OIL) 1000 MG CAPS Take 1,000 mg by mouth daily.     omeprazole (PRILOSEC) 20 MG capsule TAKE 1 CAPSULE (20 MG TOTAL) BY MOUTH 2 (TWO) TIMES DAILY BEFORE A MEAL. (Patient taking differently: Take 20 mg by mouth daily. ) 180 capsule 3   ONETOUCH VERIO test strip      oxyCODONE-acetaminophen (PERCOCET) 10-325 MG tablet Take 1 tablet by mouth daily as needed for pain.     pravastatin (PRAVACHOL) 20 MG tablet Take 10 mg by mouth daily.      ramipril (ALTACE) 10 MG capsule Take 10 mg by mouth daily.      triamcinolone (NASACORT ALLERGY 24HR) 55 MCG/ACT AERO nasal inhaler Place 2 sprays into the nose daily as needed (allergies).     No current facility-administered medications for this encounter.    Allergies  Allergen Reactions   Penicillins     Can't remember reaction, childhood reaction  Has patient had a PCN reaction causing immediate rash, facial/tongue/throat swelling, SOB or lightheadedness with hypotension: No Has patient had a PCN reaction causing severe rash involving mucus membranes or skin necrosis: No Has patient had a PCN reaction that required hospitalization: No Has patient had a PCN reaction occurring within the last 10 years: No If all of the above answers are "NO", then may proceed with Cephalosporin use.    Rofecoxib     Vioxx, increase BP    Social History   Socioeconomic History   Marital status: Married    Spouse name: Jeani Hawking   Number of children: 2   Years of education: Not on file   Highest education level: Not on file  Occupational History   Occupation: MAINTENANCE    Employer: [employer]  Tobacco Use   Smoking status: Former Smoker    Packs/day: 1.00    Years: 1.00    Pack years: 1.00    Types: Cigarettes    Quit date: 07/18/1980    Years since quitting: 39.5   Smokeless tobacco: Never Used  Vaping Use   Vaping Use: Never used  Substance and Sexual Activity   Alcohol use: No    Alcohol/week: 0.0 standard drinks   Drug use: No    Sexual activity: Yes  Other Topics Concern   Not on file  Social History Narrative   Not on file   Social Determinants of Health   Financial Resource Strain:    Difficulty of Paying Living Expenses:   Food Insecurity:    Worried About Charity fundraiser in the Last Year:    Arboriculturist in the Last Year:   Transportation  Needs:    Lack of Transportation (Medical):    Lack of Transportation (Non-Medical):   Physical Activity:    Days of Exercise per Week:    Minutes of Exercise per Session:   Stress:    Feeling of Stress :   Social Connections:    Frequency of Communication with Friends and Family:    Frequency of Social Gatherings with Friends and Family:    Attends Religious Services:    Active Member of Clubs or Organizations:    Attends Music therapist:    Marital Status:   Intimate Partner Violence:    Fear of Current or Ex-Partner:    Emotionally Abused:    Physically Abused:    Sexually Abused:     Family History  Problem Relation Age of Onset   Heart disease Maternal Grandmother    Heart disease Mother    Cancer Paternal Grandmother        unsure what kind   Lung cancer Father    Colon cancer Neg Hx    Esophageal cancer Neg Hx    Rectal cancer Neg Hx    Stomach cancer Neg Hx     ROS- All systems are reviewed and negative except as per the HPI above  Physical Exam: Vitals:   02/05/20 0827  Weight: (!) 167 kg  Height: 6\' 1"  (1.854 m)   Wt Readings from Last 3 Encounters:  02/05/20 (!) 167 kg  01/29/20 (!) 170.1 kg  01/21/20 (!) 166.8 kg    Labs: Lab Results  Component Value Date   NA 137 01/21/2020   K 4.4 01/21/2020   CL 104 01/21/2020   CO2 20 (L) 01/21/2020   GLUCOSE 156 (H) 01/21/2020   BUN 24 (H) 01/21/2020   CREATININE 1.11 01/21/2020   CALCIUM 9.2 01/21/2020   No results found for: INR No results found for: CHOL, HDL, LDLCALC, TRIG   GEN- The patient is well appearing, alert and  oriented x 3 today.   Head- normocephalic, atraumatic Eyes-  Sclera clear, conjunctiva pink Ears- hearing intact Oropharynx- clear Neck- supple, no JVP Lymph- no cervical lymphadenopathy Lungs- Clear to ausculation bilaterally, normal work of breathing Heart- irregular rate and rhythm, no murmurs, rubs or gallops, PMI not laterally displaced GI- soft, NT, ND, + BS Extremities- no clubbing, cyanosis, or edema MS- no significant deformity or atrophy Skin- no rash or lesion Psych- euthymic mood, full affect Neuro- strength and sensation are intact  EKG- afib at 76 , qrs int 78 ms, qtc 409 ms  PCP records reviewed     Assessment and Plan: 1. 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 CHA2DS2VASc score of at least 3 Continue eliquis 5 mg bid, uninterrupted use since 6/18   I will call pt after echo results are know to further discuss plans    Butch Penny C. Zahava Quant, [facility] [address] [place], [place] [address] [phone]

## 2020-02-20 ENCOUNTER — Other Ambulatory Visit: Payer: Self-pay

## 2020-02-20 ENCOUNTER — Ambulatory Visit (HOSPITAL_COMMUNITY)
Admission: RE | Admit: 2020-02-20 | Discharge: 2020-02-20 | Disposition: A | Discharge status: Home / Self Care | Payer: Medicare Other | Source: Ambulatory Visit | Attending: Internal Medicine | Admitting: Internal Medicine

## 2020-02-20 DIAGNOSIS — I517 Cardiomegaly: Secondary | ICD-10-CM | POA: Diagnosis not present

## 2020-02-20 DIAGNOSIS — I4819 Other persistent atrial fibrillation: Secondary | ICD-10-CM | POA: Diagnosis present

## 2020-02-20 LAB — ECHOCARDIOGRAM COMPLETE: S' Lateral: 3.3 cm

## 2020-02-20 NOTE — Progress Notes (Signed)
  Echocardiogram 2D Echocardiogram has been performed.  Evan Berry 02/20/2020, 9:49 AM

## 2020-02-26 NOTE — Progress Notes (Signed)
Patient was notified regarding his Echocardiogram results. Discussed with patient if he wants to try antiarrhythmics or live in A-fib. He has decided at this point he wants to continue to live in A-fib. Consulted with patient and he verbalized understanding.

## 2020-03-26 ENCOUNTER — Other Ambulatory Visit (HOSPITAL_COMMUNITY): Payer: Self-pay | Admitting: Nurse Practitioner

## 2020-11-25 ENCOUNTER — Other Ambulatory Visit: Payer: Self-pay | Admitting: Gastroenterology

## 2021-01-18 ENCOUNTER — Other Ambulatory Visit (HOSPITAL_COMMUNITY): Payer: Self-pay | Admitting: Nurse Practitioner

## 2021-03-20 ENCOUNTER — Other Ambulatory Visit (HOSPITAL_COMMUNITY): Payer: Self-pay | Admitting: Nurse Practitioner

## 2021-04-14 ENCOUNTER — Other Ambulatory Visit (HOSPITAL_COMMUNITY): Payer: Self-pay | Admitting: Nurse Practitioner

## 2021-04-23 ENCOUNTER — Other Ambulatory Visit (HOSPITAL_COMMUNITY): Payer: Self-pay | Admitting: Nurse Practitioner

## 2022-01-29 ENCOUNTER — Other Ambulatory Visit (HOSPITAL_COMMUNITY): Payer: Self-pay | Admitting: Nurse Practitioner

## 2022-08-16 ENCOUNTER — Other Ambulatory Visit (HOSPITAL_COMMUNITY): Payer: Self-pay | Admitting: Family Medicine

## 2022-08-16 DIAGNOSIS — R0602 Shortness of breath: Secondary | ICD-10-CM

## 2022-08-17 ENCOUNTER — Ambulatory Visit (HOSPITAL_COMMUNITY): Payer: Medicare Other | Attending: Cardiology

## 2022-08-17 DIAGNOSIS — R0602 Shortness of breath: Secondary | ICD-10-CM | POA: Diagnosis not present

## 2022-08-17 LAB — ECHOCARDIOGRAM COMPLETE
Area-P 1/2: 3.43 cm2
S' Lateral: 3.1 cm

## 2022-08-25 ENCOUNTER — Encounter (HOSPITAL_COMMUNITY): Payer: Self-pay | Admitting: *Deleted

## 2022-08-30 ENCOUNTER — Telehealth: Payer: Self-pay

## 2022-08-30 ENCOUNTER — Ambulatory Visit: Payer: Medicare Other | Admitting: Gastroenterology

## 2022-08-30 ENCOUNTER — Encounter: Payer: Self-pay | Admitting: Gastroenterology

## 2022-08-30 VITALS — BP 126/74 | HR 75 | Ht 72.0 in | Wt 389.0 lb

## 2022-08-30 DIAGNOSIS — Z7901 Long term (current) use of anticoagulants: Secondary | ICD-10-CM

## 2022-08-30 DIAGNOSIS — R195 Other fecal abnormalities: Secondary | ICD-10-CM

## 2022-08-30 MED ORDER — NA SULFATE-K SULFATE-MG SULF 17.5-3.13-1.6 GM/177ML PO SOLN: 1.0000 | Freq: Once | ORAL | 0 refills | Status: AC

## 2022-08-30 MED ORDER — NA SULFATE-K SULFATE-MG SULF 17.5-3.13-1.6 GM/177ML PO SOLN: 1.0000 | Freq: Once | ORAL | 0 refills | Status: DC

## 2022-08-30 NOTE — H&P (View-Only) (Signed)
  Assessment    Hemoccult positive stool and mild infrequent epigastric pain.  History of ulcer disease.  R/O colorectal neoplasm, AVMs, ulcer, gastritis GERD A-fib maintained on Eliquis S/P gastric bariatric surgery, 1983 S/P cholecystectomy, 1998 OSA DM2 HTN Morbid obesity, BMI= 52.76   Recommendations   Schedule colonoscopy and EGD at [facility]. The risks (including bleeding, perforation, infection, missed lesions, medication reactions and possible hospitalization or surgery if complications occur), benefits, and alternatives to colonoscopy with possible biopsy and possible polypectomy were discussed with the patient and they consent to proceed.  The risks (including bleeding, perforation, infection, missed lesions, medication reactions and possible hospitalization or surgery if complications occur), benefits, and alternatives to endoscopy with possible biopsy and possible dilation were discussed with the patient and they consent to proceed.   Hold Eliquis 2 days before procedure - will instruct when and how to resume after procedure. Low but real risk of cardiovascular event such as heart attack, stroke, embolism, thrombosis or ischemia/infarct of other organs off Eliquis explained and need to seek urgent help if this occurs. The patient consents to proceed. Will communicate by phone or EMR with patient's prescribing provider to confirm that holding Eliquis is reasonable in this case.   Continue omeprazole 20 mg daily.  Continue to avoid ASA, NSAIDs.    HPI   Chief complaint: Hemoccult positive stool, mild epigastric pain  Patient profile:  Evan Berry is a 67 y.o. male referred by Mark Perini, MD with hemoccult positive stool and mild epigastric pain.  He relates infrequent episodes of mild epigastric burning pain that is relieved by drinking water.  He takes omeprazole 20 mg daily.  He has a history of a bleeding ulcer in the remote past.  Most recent colonoscopy and EGD  listed below.  Hemoccult positive stool was found on a routine exam.  He is maintained on Eliquis for atrial fibrillation. Denies weight loss, constipation, diarrhea, change in stool caliber, melena, hematochezia, nausea, vomiting, dysphagia, reflux symptoms, chest pain.    Previous Labs / Imaging::    Latest Ref Rng & Units 01/21/2020    8:59 AM 01/20/2011    8:28 AM 11/07/2008   12:26 PM  CBC  WBC 4.0 - 10.5 K/uL 7.6  5.2  6.8   Hemoglobin 13.0 - 17.0 g/dL 14.2  12.3  11.7   Hematocrit 39.0 - 52.0 % 44.5  38.6  36.0   Platelets 150 - 400 K/uL 201  168  192     No results found for: "LIPASE"    Latest Ref Rng & Units 01/21/2020    8:59 AM 01/20/2011    8:28 AM 11/07/2008   12:26 PM  CMP  Glucose 70 - 99 mg/dL 156  186  156   BUN 8 - 23 mg/dL 24  26  21   Creatinine 0.61 - 1.24 mg/dL 1.11  1.39  1.63   Sodium 135 - 145 mmol/L 137  141  138   Potassium 3.5 - 5.1 mmol/L 4.4  4.7  4.5   Chloride 98 - 111 mmol/L 104  104  106   CO2 22 - 32 mmol/L 20  30  27   Calcium 8.9 - 10.3 mg/dL 9.2  9.4  9.3      Previous GI evaluation    Endoscopies:  Colonoscopy May 2017: Normal  EGD June 2019: S/P gastric partitioning with a normal-sized pouch, multiple gastric polyps. Path: fundic gland polyps  Imaging:     Past Medical   History:  Diagnosis Date   Allergic rhinitis    Arthritis    Blood transfusion without reported diagnosis    Cataract    Chronic kidney disease    pt. denies   Diabetes ([facility])    type 2   Dyspnea on exertion    GERD (gastroesophageal reflux disease)    History of hiatal hernia    Hyperlipidemia    Hypertension    Hypothyroidism    Iron deficiency anemia    OSA (obstructive sleep apnea)    PUD (peptic ulcer disease)    Sleep apnea    WEARS CPAP   Past Surgical History:  Procedure Laterality Date   BACK SURGERY  11/2008   x2   bleeding ulcer surgery     CARDIOVERSION N/A 01/29/2020   Procedure: CARDIOVERSION;  Surgeon: Croitoru, Mihai, MD;  Location:  [facility];  Service: Cardiovascular;  Laterality: N/A;   CHOLECYSTECTOMY  1998   COLONOSCOPY     ESOPHAGOGASTRODUODENOSCOPY (EGD) WITH PROPOFOL N/A 12/18/2017   Procedure: ESOPHAGOGASTRODUODENOSCOPY (EGD) WITH PROPOFOL;  Surgeon: Cleston Lautner T, MD;  Location: [facility];  Service: Endoscopy;  Laterality: N/A;   GASTRIC BYPASS  1983   GASTRIC RESTRICTION SURGERY     POLYPECTOMY  12/18/2017   Procedure: POLYPECTOMY;  Surgeon: Doniesha Landau T, MD;  Location: [facility];  Service: Endoscopy;;   Family History  Problem Relation Age of Onset   Heart disease Maternal Grandmother    Heart disease Mother    Cancer Paternal Grandmother        unsure what kind   Lung cancer Father    Colon cancer Neg Hx    Esophageal cancer Neg Hx    Rectal cancer Neg Hx    Stomach cancer Neg Hx    Social History   Tobacco Use   Smoking status: Former    Packs/day: 1.00    Years: 1.00    Total pack years: 1.00    Types: Cigarettes    Quit date: 07/18/1980    Years since quitting: 42.1   Smokeless tobacco: Never  Vaping Use   Vaping Use: Never used  Substance Use Topics   Alcohol use: No    Alcohol/week: 0.0 standard drinks of alcohol   Drug use: No   Current Outpatient Medications  Medication Sig Dispense Refill   acetaminophen (TYLENOL) 650 MG CR tablet Take 1,300 mg by mouth every 8 (eight) hours as needed for pain.     apixaban (ELIQUIS) 5 MG TABS tablet Take 1 tablet (5 mg total) by mouth 2 (two) times daily. appt req for refill [id number] 60 tablet 0   Cyanocobalamin (B-12) 2500 MCG TABS Take 2,500 mcg by mouth daily.     levothyroxine (SYNTHROID, LEVOTHROID) 150 MCG tablet Take 150 mcg by mouth daily.       metFORMIN (GLUCOPHAGE-XR) 500 MG 24 hr tablet Take 1,000 mg by mouth 2 (two) times daily.  3   metoprolol tartrate (LOPRESSOR) 25 MG tablet TAKE 0.5 TABLETS (12.5 MG TOTAL) BY MOUTH 2 (TWO) TIMES DAILY. APPOINTMENT REQUIRED FOR FURTHER REFILLS [phone] 15 tablet 0   Multiple  Vitamin (MULTIVITAMIN) capsule Take 1 capsule by mouth daily.       Omega-3 Fatty Acids (FISH OIL) 1000 MG CAPS Take 1,000 mg by mouth daily.     omeprazole (PRILOSEC) 20 MG capsule TAKE 1 CAPSULE (20 MG TOTAL) BY MOUTH 2 (TWO) TIMES DAILY BEFORE A MEAL. (Patient taking differently: Take 20 mg by mouth daily.) 180 capsule 3     ONETOUCH VERIO test strip      oxyCODONE-acetaminophen (PERCOCET) 10-325 MG tablet Take 1 tablet by mouth daily as needed for pain.     pravastatin (PRAVACHOL) 20 MG tablet Take 10 mg by mouth daily.      ramipril (ALTACE) 10 MG capsule Take 10 mg by mouth daily.      triamcinolone (NASACORT ALLERGY 24HR) 55 MCG/ACT AERO nasal inhaler Place 2 sprays into the nose daily as needed (allergies).     No current facility-administered medications for this visit.   Allergies  Allergen Reactions   Penicillins     Can't remember reaction, childhood reaction  Has patient had a PCN reaction causing immediate rash, facial/tongue/throat swelling, SOB or lightheadedness with hypotension: No Has patient had a PCN reaction causing severe rash involving mucus membranes or skin necrosis: No Has patient had a PCN reaction that required hospitalization: No Has patient had a PCN reaction occurring within the last 10 years: No If all of the above answers are "NO", then may proceed with Cephalosporin use.    Rofecoxib     Vioxx, increase BP    Review of Systems: All other systems reviewed and negative except where noted in HPI.    Physical Exam    Wt Readings from Last 3 Encounters:  08/30/22 (!) 389 lb (176.4 kg)  02/05/20 (!) 368 lb 3.2 oz (167 kg)  01/29/20 (!) 375 lb (170.1 kg)    BP 126/74   Pulse 75   Ht 6' (1.829 m)   Wt (!) 389 lb (176.4 kg)   BMI 52.76 kg/m  Constitutional:  Generally well appearing male in no acute distress. HEENT: Pupils normal.  Conjunctivae are normal. No scleral icterus. No oral lesions or deformities noted.  Neck: Supple.  Cardiac: Normal  rate, regular rhythm without murmurs. Pulmonary/chest: Effort normal and breath sounds normal. No wheezing, rales or rhonchi. Abdominal: Soft, nondistended, nontender. Active bowel sounds. No palpable HSM, masses or hernias. Rectal: Deferred to colonoscopy  Extremities: No edema or deformities noted Neurological: Alert and oriented to person, place and time. Psychiatric: Pleasant. Normal mood and affect. Behavior is normal. Skin: Skin is warm and dry. No rashes noted.  Nykia Turko, MD   cc:  Referring Provider Perini, Mark, MD        

## 2022-08-30 NOTE — Progress Notes (Signed)
Assessment    Hemoccult positive stool and mild infrequent epigastric pain.  History of ulcer disease.  R/O colorectal neoplasm, AVMs, ulcer, gastritis GERD A-fib maintained on Eliquis S/P gastric bariatric surgery, 1983 S/P cholecystectomy, 1998 OSA DM2 HTN Morbid obesity, BMI= 52.76   Recommendations   Schedule colonoscopy and EGD at [facility] hospital. The risks (including bleeding, perforation, infection, missed lesions, medication reactions and possible hospitalization or surgery if complications occur), benefits, and alternatives to colonoscopy with possible biopsy and possible polypectomy were discussed with the patient and they consent to proceed.  The risks (including bleeding, perforation, infection, missed lesions, medication reactions and possible hospitalization or surgery if complications occur), benefits, and alternatives to endoscopy with possible biopsy and possible dilation were discussed with the patient and they consent to proceed.   Hold Eliquis 2 days before procedure - will instruct when and how to resume after procedure. Low but real risk of cardiovascular event such as heart attack, stroke, embolism, thrombosis or ischemia/infarct of other organs off Eliquis explained and need to seek urgent help if this occurs. The patient consents to proceed. Will communicate by phone or EMR with patient's prescribing provider to confirm that holding Eliquis is reasonable in this case.   Continue omeprazole 20 mg daily.  Continue to avoid ASA, NSAIDs.    HPI   Chief complaint: Hemoccult positive stool, mild epigastric pain  Patient profile:  RAYNARD BIEGER is a 67 y.o. male referred by Crist Infante, MD with hemoccult positive stool and mild epigastric pain.  He relates infrequent episodes of mild epigastric burning pain that is relieved by drinking water.  He takes omeprazole 20 mg daily.  He has a history of a bleeding ulcer in the remote past.  Most recent colonoscopy and EGD  listed below.  Hemoccult positive stool was found on a routine exam.  He is maintained on Eliquis for atrial fibrillation. Denies weight loss, constipation, diarrhea, change in stool caliber, melena, hematochezia, nausea, vomiting, dysphagia, reflux symptoms, chest pain.    Previous Labs / Imaging::    Latest Ref Rng & Units 01/21/2020    8:59 AM 01/20/2011    8:28 AM 11/07/2008   12:26 PM  CBC  WBC 4.0 - 10.5 K/uL 7.6  5.2  6.8   Hemoglobin 13.0 - 17.0 g/dL 14.2  12.3  11.7   Hematocrit 39.0 - 52.0 % 44.5  38.6  36.0   Platelets 150 - 400 K/uL 201  168  192     No results found for: "LIPASE"    Latest Ref Rng & Units 01/21/2020    8:59 AM 01/20/2011    8:28 AM 11/07/2008   12:26 PM  CMP  Glucose 70 - 99 mg/dL 156  186  156   BUN 8 - 23 mg/dL 24  26  21   $ Creatinine 0.61 - 1.24 mg/dL 1.11  1.39  1.63   Sodium 135 - 145 mmol/L 137  141  138   Potassium 3.5 - 5.1 mmol/L 4.4  4.7  4.5   Chloride 98 - 111 mmol/L 104  104  106   CO2 22 - 32 mmol/L 20  30  27   $ Calcium 8.9 - 10.3 mg/dL 9.2  9.4  9.3      Previous GI evaluation    Endoscopies:  Colonoscopy May 2017: Normal  EGD June 2019: S/P gastric partitioning with a normal-sized pouch, multiple gastric polyps. Path: fundic gland polyps  Imaging:     Past Medical  History:  Diagnosis Date   Allergic rhinitis    Arthritis    Blood transfusion without reported diagnosis    Cataract    Chronic kidney disease    pt. denies   Diabetes ([place])    type 2   Dyspnea on exertion    GERD (gastroesophageal reflux disease)    History of hiatal hernia    Hyperlipidemia    Hypertension    Hypothyroidism    Iron deficiency anemia    OSA (obstructive sleep apnea)    PUD (peptic ulcer disease)    Sleep apnea    WEARS CPAP   Past Surgical History:  Procedure Laterality Date   BACK SURGERY  11/2008   x2   bleeding ulcer surgery     CARDIOVERSION N/A 01/29/2020   Procedure: CARDIOVERSION;  Surgeon: Sanda Klein, MD;  Location:  [facility];  Service: Cardiovascular;  Laterality: N/A;   CHOLECYSTECTOMY  1998   COLONOSCOPY     ESOPHAGOGASTRODUODENOSCOPY (EGD) WITH PROPOFOL N/A 12/18/2017   Procedure: ESOPHAGOGASTRODUODENOSCOPY (EGD) WITH PROPOFOL;  Surgeon: Ladene Artist, MD;  Location: [facility];  Service: Endoscopy;  Laterality: N/A;   GASTRIC BYPASS  1983   GASTRIC RESTRICTION SURGERY     POLYPECTOMY  12/18/2017   Procedure: POLYPECTOMY;  Surgeon: Ladene Artist, MD;  Location: [facility];  Service: Endoscopy;;   Family History  Problem Relation Age of Onset   Heart disease Maternal Grandmother    Heart disease Mother    Cancer Paternal Grandmother        unsure what kind   Lung cancer Father    Colon cancer Neg Hx    Esophageal cancer Neg Hx    Rectal cancer Neg Hx    Stomach cancer Neg Hx    Social History   Tobacco Use   Smoking status: Former    Packs/day: 1.00    Years: 1.00    Total pack years: 1.00    Types: Cigarettes    Quit date: 07/18/1980    Years since quitting: 42.1   Smokeless tobacco: Never  Vaping Use   Vaping Use: Never used  Substance Use Topics   Alcohol use: No    Alcohol/week: 0.0 standard drinks of alcohol   Drug use: No   Current Outpatient Medications  Medication Sig Dispense Refill   acetaminophen (TYLENOL) 650 MG CR tablet Take 1,300 mg by mouth every 8 (eight) hours as needed for pain.     apixaban (ELIQUIS) 5 MG TABS tablet Take 1 tablet (5 mg total) by mouth 2 (two) times daily. appt req for refill [id number] 60 tablet 0   Cyanocobalamin (B-12) 2500 MCG TABS Take 2,500 mcg by mouth daily.     levothyroxine (SYNTHROID, LEVOTHROID) 150 MCG tablet Take 150 mcg by mouth daily.       metFORMIN (GLUCOPHAGE-XR) 500 MG 24 hr tablet Take 1,000 mg by mouth 2 (two) times daily.  3   metoprolol tartrate (LOPRESSOR) 25 MG tablet TAKE 0.5 TABLETS (12.5 MG TOTAL) BY MOUTH 2 (TWO) TIMES DAILY. APPOINTMENT REQUIRED FOR FURTHER REFILLS [phone] 15 tablet 0   Multiple  Vitamin (MULTIVITAMIN) capsule Take 1 capsule by mouth daily.       Omega-3 Fatty Acids (FISH OIL) 1000 MG CAPS Take 1,000 mg by mouth daily.     omeprazole (PRILOSEC) 20 MG capsule TAKE 1 CAPSULE (20 MG TOTAL) BY MOUTH 2 (TWO) TIMES DAILY BEFORE A MEAL. (Patient taking differently: Take 20 mg by mouth daily.) 180 capsule 3  ONETOUCH VERIO test strip      oxyCODONE-acetaminophen (PERCOCET) 10-325 MG tablet Take 1 tablet by mouth daily as needed for pain.     pravastatin (PRAVACHOL) 20 MG tablet Take 10 mg by mouth daily.      ramipril (ALTACE) 10 MG capsule Take 10 mg by mouth daily.      triamcinolone (NASACORT ALLERGY 24HR) 55 MCG/ACT AERO nasal inhaler Place 2 sprays into the nose daily as needed (allergies).     No current facility-administered medications for this visit.   Allergies  Allergen Reactions   Penicillins     Can't remember reaction, childhood reaction  Has patient had a PCN reaction causing immediate rash, facial/tongue/throat swelling, SOB or lightheadedness with hypotension: No Has patient had a PCN reaction causing severe rash involving mucus membranes or skin necrosis: No Has patient had a PCN reaction that required hospitalization: No Has patient had a PCN reaction occurring within the last 10 years: No If all of the above answers are "NO", then may proceed with Cephalosporin use.    Rofecoxib     Vioxx, increase BP    Review of Systems: All other systems reviewed and negative except where noted in HPI.    Physical Exam    Wt Readings from Last 3 Encounters:  08/30/22 (!) 389 lb (176.4 kg)  02/05/20 (!) 368 lb 3.2 oz (167 kg)  01/29/20 (!) 375 lb (170.1 kg)    BP 126/74   Pulse 75   Ht 6' (1.829 m)   Wt (!) 389 lb (176.4 kg)   BMI 52.76 kg/m  Constitutional:  Generally well appearing male in no acute distress. HEENT: Pupils normal.  Conjunctivae are normal. No scleral icterus. No oral lesions or deformities noted.  Neck: Supple.  Cardiac: Normal  rate, regular rhythm without murmurs. Pulmonary/chest: Effort normal and breath sounds normal. No wheezing, rales or rhonchi. Abdominal: Soft, nondistended, nontender. Active bowel sounds. No palpable HSM, masses or hernias. Rectal: Deferred to colonoscopy  Extremities: No edema or deformities noted Neurological: Alert and oriented to person, place and time. Psychiatric: Pleasant. Normal mood and affect. Behavior is normal. Skin: Skin is warm and dry. No rashes noted.  Lucio Edward, MD   cc:  Referring Provider Crist Infante, MD

## 2022-08-30 NOTE — Telephone Encounter (Signed)
Callback team can we contact patient's PCP and get a copy of his latest CBC and BMET prior to submitting for guidance on holding Eliquis.  Thanks,

## 2022-08-30 NOTE — Telephone Encounter (Signed)
[facility] Pre-operative Risk Assessment     Request for surgical clearance:   [place]   Endoscopy Procedure  What type of surgery is being performed?     EGD/Colon  When is this surgery scheduled?     09/26/22  What type of clearance is required ?   Pharmacy  Are there any medications that need to be held prior to surgery and how long? Eliquis x 2 days  Practice name and name of physician performing surgery?      [facility]  What is your office phone and fax number?      Phone- [phone]  [phone]  Anesthesia type (None, local, MAC, general) ?       MAC

## 2022-08-30 NOTE — Patient Instructions (Signed)
You have been scheduled for an endoscopy and colonoscopy. Please follow the written instructions given to you at your visit today. Please pick up your prep supplies at the pharmacy within the next 1-3 days. If you use inhalers (even only as needed), please bring them with you on the day of your procedure.  The [place] [facility] providers would like to encourage you to use [facility] to communicate with providers for non-urgent requests or questions.  Due to long hold times on the telephone, sending your provider a message by [facility] may be a faster and more efficient way to get a response.  Please allow 48 business hours for a response.  Please remember that this is for non-urgent requests.   Due to recent changes in healthcare laws, you may see the results of your imaging and laboratory studies on MyChart before your provider has had a chance to review them.  We understand that in some cases there may be results that are confusing or concerning to you. Not all laboratory results come back in the same time frame and the provider may be waiting for multiple results in order to interpret others.  Please give [place] 48 hours in order for your provider to thoroughly review all the results before contacting the office for clarification of your results.   Thank you for choosing me and [facility].  Pricilla Riffle. Dagoberto Ligas., MD., Marval Regal

## 2022-08-31 NOTE — Telephone Encounter (Signed)
Left a detailed message on the voicemail for the medical records person at Dr Perini's office. Requested a call back with any questions or concerns.

## 2022-08-31 NOTE — Telephone Encounter (Signed)
Pharmacy please advise on holding Eliquis prior to EGD/colonoscopy scheduled for 09/26/2022. Thank you.

## 2022-08-31 NOTE — Telephone Encounter (Signed)
Labs have been received. Do you want me to scan them to your email to review?

## 2022-09-01 NOTE — Telephone Encounter (Signed)
Pt has only followed in afib clinic with most recent visit in 2021. Needs preop visit with reassessment of risk factors before clearance can be provided.

## 2022-09-01 NOTE — Telephone Encounter (Signed)
Per Bernerd Pho; scheduling team: I left him a VM to call back.   Pt will need a NEW PT appt Gen Card for pre op clearance.

## 2022-09-01 NOTE — Telephone Encounter (Signed)
   Name: OATHER MUILENBURG  DOB: 12-01-1955  MRN: [id number]  Primary Cardiologist: Afib clinic - [facility], needs a primary cardiologist   Chart reviewed as part of pre-operative protocol coverage. Because of Edon R Misuraca's past medical history and time since last visit, he will require a follow-up in-office visit in order to better assess preoperative cardiovascular risk.  Pre-op covering staff: - Please schedule appointment and call patient to inform them. If patient already had an upcoming appointment within acceptable timeframe, please add "pre-op clearance" to the appointment notes so provider is aware. - Please contact requesting surgeon's office via preferred method (i.e, phone, fax) to inform them of need for appointment prior to surgery.   Tami Lin Yoneko Talerico, PA  09/01/2022, 12:01 PM

## 2022-09-01 NOTE — Telephone Encounter (Signed)
OUR OFFICE HAS LABS THAT WERE FAXED OVER FROM PT'S PRIMARY CARE. ONCE WE KNOW WHO THE PT WILL BE SCHEDULED WITH, WE WILL PUT THESE NOTES IN THE MD BOX FOR THE APPT.

## 2022-09-05 NOTE — Telephone Encounter (Signed)
DPR ok to s/w the pt's wife who has scheduled a NEW PT appt for the pt to see Dr. Acie Fredrickson for gen card, pt see's A-fib clinic, though they do not provider risk stratification. Per the pt's wife the pt prefers a male doctor. Appt with Dr. Acie Fredrickson 09/13/22 @ 8:20 as new pt for pre op clearance. Our  office had also received labs from PCP Dr. Joylene Draft. I will place the labs in the Dr. Acie Fredrickson box for his nurse to provide during the new pt appt.   Pt's wife has been given [place] of [place].   I will update all parties involved.

## 2022-09-12 ENCOUNTER — Encounter: Payer: Self-pay | Admitting: Cardiovascular Disease

## 2022-09-12 NOTE — Progress Notes (Unsigned)
Cardiology Office Note:  Feb. 27, 2024    Date:  09/13/2022   ID:  Evan Berry, DOB [birth year], MRN [id number]  PCP:  Crist Infante, MD   [place] Providers Cardiologist:  New to [facility]     Referring MD: Crist Infante, MD   Chief Complaint  Patient presents with   Atrial Fibrillation    History of Present Illness: Feb. 27, 2024    Evan Berry is a 67 y.o. male with a hx of atrial fib, CKD, DM, HTN, hypothyroidism, OSA / CPAP   Pt is scheduled for a colonoscopy / EGD. We are asked to see him for pre op evaluation  He has been followed by the Atrial fib clinic in the past. Had a cardioversion in 2021.   Went back into afib within a week  Is currently on Eliquis 5 BID  Is back in Afib today   Hx of morbid obesity  Body mass index is 53.82 kg/m.  No regular exercise,  has bad knees. No CP Has DOE with walking  No syncope   Needs to have a colonoscopy.  Had a + hemoccult      Past Medical History:  Diagnosis Date   Allergic rhinitis    Arthritis    Blood transfusion without reported diagnosis    Cataract    Chronic kidney disease    pt. denies   Diabetes ([place])    type 2   Dyspnea on exertion    GERD (gastroesophageal reflux disease)    History of hiatal hernia    Hyperlipidemia    Hypertension    Hypothyroidism    Iron deficiency anemia    OSA (obstructive sleep apnea)    PUD (peptic ulcer disease)    Sleep apnea    WEARS CPAP    Past Surgical History:  Procedure Laterality Date   BACK SURGERY  11/2008   x2   bleeding ulcer surgery     CARDIOVERSION N/A 01/29/2020   Procedure: CARDIOVERSION;  Surgeon: Sanda Klein, MD;  Location: [place];  Service: Cardiovascular;  Laterality: N/A;   CHOLECYSTECTOMY  1998   COLONOSCOPY     ESOPHAGOGASTRODUODENOSCOPY (EGD) WITH PROPOFOL N/A 12/18/2017   Procedure: ESOPHAGOGASTRODUODENOSCOPY (EGD) WITH PROPOFOL;  Surgeon: Ladene Artist, MD;  [facility]: [facility];  Service: Endoscopy;   Laterality: N/A;   GASTRIC BYPASS  1983   GASTRIC RESTRICTION SURGERY     POLYPECTOMY  12/18/2017   Procedure: POLYPECTOMY;  Surgeon: Ladene Artist, MD;  [facility];  Service: Endoscopy;;    Current Medications: Current Meds  Medication Sig   acetaminophen (TYLENOL) 650 MG CR tablet Take 1,300 mg by mouth every 8 (eight) hours as needed for pain.   apixaban (ELIQUIS) 5 MG TABS tablet Take 1 tablet (5 mg total) by mouth 2 (two) times daily. appt req for refill [id number]   Cyanocobalamin (B-12) 2500 MCG TABS Take 2,500 mcg by mouth daily.   levothyroxine (SYNTHROID, LEVOTHROID) 150 MCG tablet Take 150 mcg by mouth daily.     metFORMIN (GLUCOPHAGE-XR) 500 MG 24 hr tablet Take 1,000 mg by mouth 2 (two) times daily.   Multiple Vitamin (MULTIVITAMIN) capsule Take 1 capsule by mouth daily.     Omega-3 Fatty Acids (FISH OIL) 1000 MG CAPS Take 1,000 mg by mouth daily.   omeprazole (PRILOSEC) 20 MG capsule TAKE 1 CAPSULE (20 MG TOTAL) BY MOUTH 2 (TWO) TIMES DAILY BEFORE A MEAL. (Patient taking differently: Take 20 mg by mouth  daily.)   ONETOUCH VERIO test strip    oxyCODONE-acetaminophen (PERCOCET) 10-325 MG tablet Take 1 tablet by mouth daily as needed for pain.   pravastatin (PRAVACHOL) 20 MG tablet Take 10 mg by mouth daily.    ramipril (ALTACE) 10 MG capsule Take 10 mg by mouth daily.    triamcinolone (NASACORT ALLERGY 24HR) 55 MCG/ACT AERO nasal inhaler Place 2 sprays into the nose daily as needed (allergies).   [DISCONTINUED] metoprolol tartrate (LOPRESSOR) 25 MG tablet TAKE 0.5 TABLETS (12.5 MG TOTAL) BY MOUTH 2 (TWO) TIMES DAILY. APPOINTMENT REQUIRED FOR FURTHER REFILLS [phone] (Patient taking differently: Take 25 mg by mouth 2 (two) times daily. Appointment Required For Further Refills [phone])     Allergies:   Penicillins and Rofecoxib   Social History   Socioeconomic History   Marital status: Married    Spouse name: Jeani Hawking   Number of children: 2   Years of  education: Not on file   Highest education level: Not on file  Occupational History   Occupation: MAINTENANCE    Employer: [employer]  Tobacco Use   Smoking status: Former    Packs/day: 1.00    Years: 1.00    Total pack years: 1.00    Types: Cigarettes    Quit date: 07/18/1980    Years since quitting: 42.1   Smokeless tobacco: Never  Vaping Use   Vaping Use: Never used  Substance and Sexual Activity   Alcohol use: No    Alcohol/week: 0.0 standard drinks of alcohol   Drug use: No   Sexual activity: Yes  Other Topics Concern   Not on file  Social History Narrative   Not on file   Social Determinants of Health   Financial Resource Strain: Not on file  Food Insecurity: Not on file  Transportation Needs: Not on file  Physical Activity: Not on file  Stress: Not on file  Social Connections: Not on file     Family History: The patient's family history includes Cancer in his paternal grandmother; Heart disease in his maternal grandmother and mother; Lung cancer in his father. There is no history of Colon cancer, Esophageal cancer, Rectal cancer, or Stomach cancer.  ROS:   Please see the history of present illness.     All other systems reviewed and are negative.  EKGs/Labs/Other Studies Reviewed:    The following studies were reviewed today:   EKG: September 13, 2022: Atrial fibrillation.  He has occasional premature ventricular contraction or aberrantly conducted beats.  Heart rate is 74.   Recent Labs: No results found for requested labs within last 365 days.  Recent Lipid Panel No results found for: "CHOL", "TRIG", "HDL", "CHOLHDL", "VLDL", "LDLCALC", "LDLDIRECT"   Risk Assessment/Calculations:    CHA2DS2-VASc Score = 4   1} This indicates a 4.8% annual risk of stroke. The patient's score is based upon: CHF History: 1 HTN History: 1 Diabetes History: 1 Stroke History: 0 Vascular Disease History: 0 Age Score: 1 Gender Score: 0      HYPERTENSION  CONTROL Vitals:   09/13/22 0811 09/13/22 0839  BP: (!) 148/96 (!) 142/90    The patient's blood pressure is elevated above target today.  In order to address the patient's elevated BP: Blood pressure will be monitored at home to determine if medication changes need to be made.            Physical Exam:    VS:  BP (!) 142/90   Pulse 74   Ht 6' (1.829  m)   Wt (!) 396 lb 12.8 oz (180 kg)   SpO2 96%   BMI 53.82 kg/m     Wt Readings from Last 3 Encounters:  09/13/22 (!) 396 lb 12.8 oz (180 kg)  08/30/22 (!) 389 lb (176.4 kg)  02/05/20 (!) 368 lb 3.2 oz (167 kg)     GEN: morbidly obese, middle age man , in no acute distress HEENT: Normal NECK: No JVD; No carotid bruits LYMPHATICS: No lymphadenopathy CARDIAC: irreg. Irreg.  RESPIRATORY:  Clear to auscultation without rales, wheezing or rhonchi  ABDOMEN: Soft, non-tender, non-distended MUSCULOSKELETAL:  No edema; No deformity  SKIN: Warm and dry NEUROLOGIC:  Alert and oriented x 3 PSYCHIATRIC:  Normal affect   ASSESSMENT:    1. OSA (obstructive sleep apnea)   2. Persistent atrial fibrillation ([place])   3. HEMATOCHEZIA   4. Morbid obesity ([place])   5. Primary hypertension    PLAN:      Preoperative visit prior to colonoscopy: The patient has had atrial fibrillation and he was cardioverted in 2021 but remained in sinus rhythm perhaps for only a week.  He is now in atrial fibrillation for several years.  He is asymptomatic.  He has obstructive sleep apnea, morbid obesity.  At this point we will continue with rate control and anticoagulation strategy without attempting rhythm control.  He denies any chest pain.  He has very limited mobility due to knee problems and his morbid obesity.  He is at low risk from a cardiac standpoint for his colonoscopy and EGD.  He may hold his Eliquis for 2 to 3 days prior to the procedure.  He should restart Eliquis as soon after the procedure as is safe.  Will defer to the gastroenterology  team for restarting his Eliquis.  His main risk for for this procedure is his morbid obesity and respiratory status.  He has known obstructive sleep apnea.  Will need to be monitored very closely to make sure that he maintains adequate respiration.  I will defer to the anesthesiologist and pulmonary for further management of that.   2.  Obstructive sleep apnea: He has not seen his sleep apnea doctor in many years.  Will refer him to Dr. Radford Pax for further evaluation.  Will see him again in 1 year.  3.  HTN:   BP remains mildly elevated.   He eats an unrestricted diet.   I've encouraged him to greatly reduce the salt in his diet.  Of also advised him to work on weight loss as this is certainly contributing to his hypertension.  4.  Morbid obesity: I encouraged him to work on weight loss.  I told him that this will continue to be a major contributor to his current issues.  He did not seem to have much interest in working on weight loss.          Medication Adjustments/Labs and Tests Ordered: Current medicines are reviewed at length with the patient today.  Concerns regarding medicines are outlined above.  Orders Placed This Encounter  Procedures   Ambulatory referral to Cardiology   EKG 12-Lead   No orders of the defined types were placed in this encounter.   Patient Instructions  Medication Instructions:  Your physician recommends that you continue on your current medications as directed. Please refer to the Current Medication list given to you today.  *If you need a refill on your cardiac medications before your next appointment, please call your pharmacy*   Lab Work: NONE  If you have labs (blood work) drawn today and your tests are completely normal, you will receive your results only by: [place] (if you have MyChart) OR A paper copy in the mail If you have any lab test that is abnormal or we need to change your treatment, we will call you to review the  results.   Testing/Procedures: Ambulatory Referral to Radford Pax, MD for Sleep Apnea   Follow-Up: At [facility] you and your health needs are our priority.  As part of our continuing mission to provide you with exceptional heart care, we have created designated Provider Care Teams.  These Care Teams include your primary Cardiologist (physician) and Advanced Practice Providers (APPs -  Physician Assistants and Nurse Practitioners) who all work together to provide you with the care you need, when you need it.  We recommend signing up for the patient portal called "MyChart".  Sign up information is provided on this After Visit Summary.  MyChart is used to connect with patients for Virtual Visits (Telemedicine).  Patients are able to view lab/test results, encounter notes, upcoming appointments, etc.  Non-urgent messages can be sent to your provider as well.   To learn more about what you can do with MyChart, go to [email or web address].    Your next appointment:   1 year(s)  Provider:   Mertie Moores, MD     Signed, Mertie Moores, MD  09/13/2022 8:41 AM    [place]

## 2022-09-13 ENCOUNTER — Ambulatory Visit: Payer: Medicare Other | Attending: Cardiovascular Disease | Admitting: Cardiovascular Disease

## 2022-09-13 ENCOUNTER — Encounter: Payer: Self-pay | Admitting: Cardiovascular Disease

## 2022-09-13 VITALS — BP 142/90 | HR 74 | Ht 72.0 in | Wt 396.8 lb

## 2022-09-13 DIAGNOSIS — I10 Essential (primary) hypertension: Secondary | ICD-10-CM | POA: Insufficient documentation

## 2022-09-13 DIAGNOSIS — G4733 Obstructive sleep apnea (adult) (pediatric): Secondary | ICD-10-CM | POA: Diagnosis not present

## 2022-09-13 DIAGNOSIS — K921 Melena: Secondary | ICD-10-CM | POA: Diagnosis not present

## 2022-09-13 DIAGNOSIS — I4819 Other persistent atrial fibrillation: Secondary | ICD-10-CM

## 2022-09-13 NOTE — Telephone Encounter (Signed)
Office note routed to GI. Will remove from pre-op APP  box.

## 2022-09-13 NOTE — Patient Instructions (Signed)
Medication Instructions:  Your physician recommends that you continue on your current medications as directed. Please refer to the Current Medication list given to you today.  *If you need a refill on your cardiac medications before your next appointment, please call your pharmacy*   Lab Work: NONE If you have labs (blood work) drawn today and your tests are completely normal, you will receive your results only by: [facility] (if you have MyChart) OR A paper copy in the mail If you have any lab test that is abnormal or we need to change your treatment, we will call you to review the results.   Testing/Procedures: Ambulatory Referral to Radford Pax, MD for Sleep Apnea   Follow-Up: At [facility], you and your health needs are our priority.  As part of our continuing mission to provide you with exceptional heart care, we have created designated Provider Care Teams.  These Care Teams include your primary Cardiologist (physician) and Advanced Practice Providers (APPs -  Physician Assistants and Nurse Practitioners) who all work together to provide you with the care you need, when you need it.  We recommend signing up for the patient portal called "MyChart".  Sign up information is provided on this After Visit Summary.  MyChart is used to connect with patients for Virtual Visits (Telemedicine).  Patients are able to view lab/test results, encounter notes, upcoming appointments, etc.  Non-urgent messages can be sent to your provider as well.   To learn more about what you can do with MyChart, go to [email or web address].    Your next appointment:   1 year(s)  Provider:   Mertie Moores, MD

## 2022-09-13 NOTE — Telephone Encounter (Signed)
Per Cardiology office note from today patient can hold Eliquis 2 days prior to his procedure. Patient was informed by Cardiology today.

## 2022-09-13 NOTE — Telephone Encounter (Signed)
Pt seen by Dr Acie Fredrickson today:  "He is at low risk from a cardiac standpoint for his colonoscopy and EGD. He may hold his Eliquis for 2 to 3 days prior to the procedure. He should restart Eliquis as soon after the procedure as is safe. Will defer to the gastroenterology team for restarting his Eliquis."

## 2022-09-19 ENCOUNTER — Encounter (HOSPITAL_COMMUNITY): Payer: Self-pay | Admitting: Gastroenterology

## 2022-09-26 ENCOUNTER — Ambulatory Visit (HOSPITAL_COMMUNITY): Payer: Medicare Other | Admitting: Certified Registered Nurse Anesthetist

## 2022-09-26 ENCOUNTER — Encounter (HOSPITAL_COMMUNITY): Payer: Self-pay | Admitting: Gastroenterology

## 2022-09-26 ENCOUNTER — Ambulatory Visit (HOSPITAL_BASED_OUTPATIENT_CLINIC_OR_DEPARTMENT_OTHER): Payer: Medicare Other | Admitting: Certified Registered Nurse Anesthetist

## 2022-09-26 ENCOUNTER — Ambulatory Visit (HOSPITAL_COMMUNITY)
Admission: RE | Admit: 2022-09-26 | Discharge: 2022-09-26 | Disposition: A | Discharge status: Home / Self Care | Payer: Medicare Other | Attending: Gastroenterology | Admitting: Gastroenterology

## 2022-09-26 ENCOUNTER — Other Ambulatory Visit: Payer: Self-pay

## 2022-09-26 ENCOUNTER — Encounter (HOSPITAL_COMMUNITY)
Admission: RE | Disposition: A | Discharge status: Home / Self Care | Payer: Self-pay | Source: Home / Self Care | Attending: Gastroenterology

## 2022-09-26 DIAGNOSIS — Z9884 Bariatric surgery status: Secondary | ICD-10-CM | POA: Insufficient documentation

## 2022-09-26 DIAGNOSIS — Z9049 Acquired absence of other specified parts of digestive tract: Secondary | ICD-10-CM | POA: Diagnosis not present

## 2022-09-26 DIAGNOSIS — K449 Diaphragmatic hernia without obstruction or gangrene: Secondary | ICD-10-CM | POA: Diagnosis not present

## 2022-09-26 DIAGNOSIS — R195 Other fecal abnormalities: Secondary | ICD-10-CM | POA: Insufficient documentation

## 2022-09-26 DIAGNOSIS — Z79899 Other long term (current) drug therapy: Secondary | ICD-10-CM | POA: Insufficient documentation

## 2022-09-26 DIAGNOSIS — K219 Gastro-esophageal reflux disease without esophagitis: Secondary | ICD-10-CM | POA: Diagnosis not present

## 2022-09-26 DIAGNOSIS — D122 Benign neoplasm of ascending colon: Secondary | ICD-10-CM | POA: Diagnosis not present

## 2022-09-26 DIAGNOSIS — D123 Benign neoplasm of transverse colon: Secondary | ICD-10-CM | POA: Diagnosis not present

## 2022-09-26 DIAGNOSIS — K3189 Other diseases of stomach and duodenum: Secondary | ICD-10-CM

## 2022-09-26 DIAGNOSIS — Z6841 Body Mass Index (BMI) 40.0 and over, adult: Secondary | ICD-10-CM | POA: Insufficient documentation

## 2022-09-26 DIAGNOSIS — K317 Polyp of stomach and duodenum: Secondary | ICD-10-CM

## 2022-09-26 DIAGNOSIS — D121 Benign neoplasm of appendix: Secondary | ICD-10-CM

## 2022-09-26 DIAGNOSIS — Z7984 Long term (current) use of oral hypoglycemic drugs: Secondary | ICD-10-CM | POA: Diagnosis not present

## 2022-09-26 DIAGNOSIS — Z7901 Long term (current) use of anticoagulants: Secondary | ICD-10-CM | POA: Diagnosis not present

## 2022-09-26 DIAGNOSIS — D124 Benign neoplasm of descending colon: Secondary | ICD-10-CM | POA: Diagnosis not present

## 2022-09-26 DIAGNOSIS — R1013 Epigastric pain: Secondary | ICD-10-CM | POA: Diagnosis not present

## 2022-09-26 DIAGNOSIS — K640 First degree hemorrhoids: Secondary | ICD-10-CM

## 2022-09-26 DIAGNOSIS — I10 Essential (primary) hypertension: Secondary | ICD-10-CM | POA: Insufficient documentation

## 2022-09-26 DIAGNOSIS — Z87891 Personal history of nicotine dependence: Secondary | ICD-10-CM | POA: Insufficient documentation

## 2022-09-26 DIAGNOSIS — E119 Type 2 diabetes mellitus without complications: Secondary | ICD-10-CM | POA: Insufficient documentation

## 2022-09-26 DIAGNOSIS — G4733 Obstructive sleep apnea (adult) (pediatric): Secondary | ICD-10-CM | POA: Insufficient documentation

## 2022-09-26 DIAGNOSIS — I4891 Unspecified atrial fibrillation: Secondary | ICD-10-CM | POA: Insufficient documentation

## 2022-09-26 DIAGNOSIS — Z9989 Dependence on other enabling machines and devices: Secondary | ICD-10-CM

## 2022-09-26 HISTORY — PX: ESOPHAGOGASTRODUODENOSCOPY (EGD) WITH PROPOFOL: SHX5813

## 2022-09-26 HISTORY — PX: POLYPECTOMY: SHX5525

## 2022-09-26 HISTORY — PX: COLONOSCOPY WITH PROPOFOL: SHX5780

## 2022-09-26 LAB — GLUCOSE, CAPILLARY: Glucose-Capillary: 134 mg/dL — ABNORMAL HIGH (ref 70–99)

## 2022-09-26 SURGERY — ESOPHAGOGASTRODUODENOSCOPY (EGD) WITH PROPOFOL
Anesthesia: Monitor Anesthesia Care

## 2022-09-26 MED ORDER — PROPOFOL 500 MG/50ML IV EMUL
INTRAVENOUS | Status: DC | PRN
  Administered 2022-09-26: 150 ug/kg/min via INTRAVENOUS

## 2022-09-26 MED ORDER — LACTATED RINGERS IV SOLN: INTRAVENOUS | Status: DC

## 2022-09-26 MED ORDER — EPHEDRINE SULFATE-NACL 50-0.9 MG/10ML-% IV SOSY
PREFILLED_SYRINGE | INTRAVENOUS | Status: DC | PRN
  Administered 2022-09-26: 10 mg via INTRAVENOUS

## 2022-09-26 MED ORDER — LIDOCAINE 2% (20 MG/ML) 5 ML SYRINGE
INTRAMUSCULAR | Status: DC | PRN
  Administered 2022-09-26: 60 mg via INTRAVENOUS

## 2022-09-26 MED ORDER — PROPOFOL 10 MG/ML IV BOLUS
INTRAVENOUS | Status: DC | PRN
  Administered 2022-09-26 (×2): 30 mg via INTRAVENOUS

## 2022-09-26 MED ORDER — PROPOFOL 1000 MG/100ML IV EMUL
INTRAVENOUS | Status: AC
  Filled 2022-09-26: qty 100

## 2022-09-26 MED ORDER — SODIUM CHLORIDE 0.9 % IV SOLN: INTRAVENOUS | Status: DC

## 2022-09-26 MED ORDER — PHENYLEPHRINE 80 MCG/ML (10ML) SYRINGE FOR IV PUSH (FOR BLOOD PRESSURE SUPPORT)
PREFILLED_SYRINGE | INTRAVENOUS | Status: DC | PRN
  Administered 2022-09-26 (×3): 120 ug via INTRAVENOUS

## 2022-09-26 SURGICAL SUPPLY — 25 items

## 2022-09-26 NOTE — Op Note (Signed)
[facility] Patient Name: Evan Berry Procedure Date: 09/26/2022 MRN: [id number] Attending MD: Ladene Artist , MD, [id number] Date of Birth: Jul 22, 1955 CSN: [id number] Age: 67 Admit Type: Outpatient Procedure:                Colonoscopy Indications:              Heme positive stool Providers:                Pricilla Riffle. Fuller Plan, MD, Altamese Cabal, RN, Frazier Richards, Technician Referring MD:             Jeannette How. Perini, MD Medicines:                Monitored Anesthesia Care Complications:            No immediate complications. Estimated blood loss:                            None. Estimated Blood Loss:     Estimated blood loss: none. Procedure:                Pre-Anesthesia Assessment:                           - Prior to the procedure, a History and Physical                            was performed, and patient medications and                            allergies were reviewed. The patient's tolerance of                            previous anesthesia was also reviewed. The risks                            and benefits of the procedure and the sedation                            options and risks were discussed with the patient.                            All questions were answered, and informed consent                            was obtained. Prior Anticoagulants: The patient has                            taken Eliquis (apixaban), last dose was 2 days                            prior to procedure. ASA Grade Assessment: III - A  patient with severe systemic disease. After                            reviewing the risks and benefits, the patient was                            deemed in satisfactory condition to undergo the                            procedure.                           After obtaining informed consent, the colonoscope                            was passed under direct vision. Throughout the                             procedure, the patient's blood pressure, pulse, and                            oxygen saturations were monitored continuously. The                            [id number] Olympus colonoscope was                            introduced through the anus and advanced to the the                            cecum, identified by appendiceal orifice and                            ileocecal valve. The ileocecal valve, appendiceal                            orifice, and rectum were photographed. The quality                            of the bowel preparation was adequate. The                            colonoscopy was performed without difficulty. The                            patient tolerated the procedure well. Scope In: 8:58:03 AM Scope Out: 9:23:31 AM Scope Withdrawal Time: 0 hours 20 minutes 5 seconds  Total Procedure Duration: 0 hours 25 minutes 28 seconds  Findings:      The perianal and digital rectal examinations were normal.      Eight sessile polyps were found in the descending colon (1), transverse       colon (5) and ascending colon (2). The polyps were 6 to 12 mm in size.       These polyps were removed with a cold snare. Resection and retrieval  were complete.      Internal hemorrhoids were found during retroflexion. The hemorrhoids       were small and Grade I (internal hemorrhoids that do not prolapse).      The exam was otherwise without abnormality on direct and retroflexion       views. Impression:               - Eight 6 to 12 mm polyps in the descending colon,                            in the transverse colon and in the ascending colon,                            removed with a cold snare. Resected and retrieved.                           - Internal hemorrhoids.                           - The examination was otherwise normal on direct                            and retroflexion views. Moderate Sedation:      Not Applicable - Patient had care  per Anesthesia. Recommendation:           - Repeat colonoscopy after studies are complete for                            surveillance based on pathology results.                           - Resume Eliquis (apixaban) in 3 days at prior                            dose. Refer to managing physician for further                            adjustment of therapy.                           - Patient has a contact number available for                            emergencies. The signs and symptoms of potential                            delayed complications were discussed with the                            patient. Return to normal activities tomorrow.                            Written discharge instructions were provided to the  patient.                           - Resume previous diet.                           - Continue present medications.                           - Await pathology results.                           - No aspirin, ibuprofen, naproxen, or other                            non-steroidal anti-inflammatory drugs for 2 weeks                            after polyp removal. Procedure Code(s):        --- Professional ---                           [phone], Colonoscopy, flexible; with removal of                            tumor(s), polyp(s), or other lesion(s) by snare                            technique Diagnosis Code(s):        --- Professional ---                           D12.4, Benign neoplasm of descending colon                           D12.3, Benign neoplasm of transverse colon (hepatic                            flexure or splenic flexure)                           D12.2, Benign neoplasm of ascending colon                           K64.0, First degree hemorrhoids                           R19.5, Other fecal abnormalities CPT copyright 2022 American Medical Association. All rights reserved. The codes documented in this report are preliminary and upon  coder review may  be revised to meet current compliance requirements. Ladene Artist, MD 09/26/2022 9:29:41 AM This report has been signed electronically. Number of Addenda: 0

## 2022-09-26 NOTE — Transfer of Care (Signed)
Immediate Anesthesia Transfer of Care Note  Patient: Evan Berry  Procedure(s) Performed: ESOPHAGOGASTRODUODENOSCOPY (EGD) WITH PROPOFOL COLONOSCOPY WITH PROPOFOL POLYPECTOMY  Patient Location: Endoscopy Unit  Anesthesia Type:MAC  Level of Consciousness: awake and patient cooperative  Airway & Oxygen Therapy: Patient Spontanous Breathing and Patient connected to face mask  Post-op Assessment: Report given to RN and Post -op Vital signs reviewed and stable  Post vital signs: Reviewed and stable  Last Vitals:  Vitals Value Taken Time  BP 94/62 09/26/22 0952  Temp 36.4 C 09/26/22 0945  Pulse 76 09/26/22 0952  Resp 29 09/26/22 0952  SpO2 97 % 09/26/22 0952  Vitals shown include unvalidated device data.  Last Pain:  Vitals:   09/26/22 0945  TempSrc: Temporal  PainSc:          Complications: No notable events documented.

## 2022-09-26 NOTE — Anesthesia Postprocedure Evaluation (Signed)
Anesthesia Post Note  Patient: Quinten R Malkin  Procedure(s) Performed: ESOPHAGOGASTRODUODENOSCOPY (EGD) WITH PROPOFOL COLONOSCOPY WITH PROPOFOL POLYPECTOMY     Patient location during evaluation: Endoscopy Anesthesia Type: MAC Level of consciousness: awake and alert Pain management: pain level controlled Vital Signs Assessment: post-procedure vital signs reviewed and stable Respiratory status: spontaneous breathing, nonlabored ventilation, respiratory function stable and patient connected to nasal cannula oxygen Cardiovascular status: stable and blood pressure returned to baseline Postop Assessment: no apparent nausea or vomiting Anesthetic complications: no   No notable events documented.  Last Vitals:  Vitals:   09/26/22 1000 09/26/22 1006  BP: 117/68 117/69  Pulse: (!) 112 87  Resp: 18 19  Temp:    SpO2: 94% 94%    Last Pain:  Vitals:   09/26/22 1006  TempSrc:   PainSc: 0-No pain                 Belenda Cruise P Janiyah Beery

## 2022-09-26 NOTE — Interval H&P Note (Signed)
History and Physical Interval Note:  09/26/2022 8:47 AM  Evan Berry  has presented today for surgery, with the diagnosis of Occcult blood in stool.  The various methods of treatment have been discussed with the patient and family. After consideration of risks, benefits and other options for treatment, the patient has consented to  Procedure(s): ESOPHAGOGASTRODUODENOSCOPY (EGD) WITH PROPOFOL (N/A) COLONOSCOPY WITH PROPOFOL (N/A) as a surgical intervention.  The patient's history has been reviewed, patient examined, no change in status, stable for surgery.  I have reviewed the patient's chart and labs.  Questions were answered to the patient's satisfaction.     Pricilla Riffle. Fuller Plan

## 2022-09-26 NOTE — Op Note (Signed)
[facility] Patient Name: Evan Berry Procedure Date: 09/26/2022 MRN: [id number] Attending MD: Ladene Artist , MD, [id number] Date of Birth: 1956/04/22 CSN: [id number] Age: 67 Admit Type: Outpatient Procedure:                Upper GI endoscopy Indications:              Epigastric abdominal pain, Gastro-esophageal reflux                            disease, Heme positive stool Providers:                Pricilla Riffle. Fuller Plan, MD, Altamese Cabal, RN, Frazier Richards, Technician Referring MD:             Jeannette How. Perini, MD Medicines:                Monitored Anesthesia Care Complications:            No immediate complications. Estimated Blood Loss:     Estimated blood loss: none. Procedure:                Pre-Anesthesia Assessment:                           - Prior to the procedure, a History and Physical                            was performed, and patient medications and                            allergies were reviewed. The patient's tolerance of                            previous anesthesia was also reviewed. The risks                            and benefits of the procedure and the sedation                            options and risks were discussed with the patient.                            All questions were answered, and informed consent                            was obtained. Prior Anticoagulants: The patient has                            taken Eliquis (apixaban), last dose was 2 days                            prior to procedure. ASA Grade Assessment: III - A  patient with severe systemic disease. After                            reviewing the risks and benefits, the patient was                            deemed in satisfactory condition to undergo the                            procedure.                           After obtaining informed consent, the endoscope was                            passed under  direct vision. Throughout the                            procedure, the patient's blood pressure, pulse, and                            oxygen saturations were monitored continuously. The                            [id number] Olympus endoscope was introduced                            through the mouth, and advanced to the second part                            of duodenum. The upper GI endoscopy was                            accomplished without difficulty. The patient                            tolerated the procedure well. Scope In: Scope Out: Findings:      The examined esophagus was normal.      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       Reservoir created in the fundus. This was characterized by healthy       appearing mucosa.      The exam of the stomach was otherwise normal except for several small       gastric body polyps c/w benign fundic gland polyps (previously biopsied).      The duodenal bulb and second portion of the duodenum were normal. Impression:               - Normal esophagus.                           - A gastroplasty was found, characterized by                            healthy appearing mucosa.                           -  Several small gastric body polyps.                           - Normal duodenal bulb and second portion of the                            duodenum.                           - No specimens collected. Moderate Sedation:      Not Applicable - Patient had care per Anesthesia. Recommendation:           - Patient has a contact number available for                            emergencies. The signs and symptoms of potential                            delayed complications were discussed with the                            patient. Return to normal activities tomorrow.                            Written discharge instructions were provided to the                            patient.                            - Resume previous diet.                           - Follow antireflux measures.                           - Continue present medications.                           - Resume Eliquis (apixaban) at prior dose in 3 days                            as per colonoscopy report. Refer to managing                            physician for further adjustment of therapy. Procedure Code(s):        --- Professional ---                           [phone], Esophagogastroduodenoscopy, flexible,                            transoral; diagnostic, including collection of                            specimen(s) by brushing or washing, when performed                            (  separate procedure) Diagnosis Code(s):        --- Professional ---                           [id number], Bariatric surgery status                           R10.13, Epigastric pain                           K21.9, Gastro-esophageal reflux disease without                            esophagitis                           R19.5, Other fecal abnormalities CPT copyright 2022 American Medical Association. All rights reserved. The codes documented in this report are preliminary and upon coder review may  be revised to meet current compliance requirements. Ladene Artist, MD 09/26/2022 9:46:18 AM This report has been signed electronically. Number of Addenda: 0

## 2022-09-26 NOTE — Anesthesia Preprocedure Evaluation (Signed)
Anesthesia Evaluation  Patient identified by MRN, date of birth, ID band Patient awake    Reviewed: Allergy & Precautions, NPO status , Patient's Chart, lab work & pertinent test results  Airway Mallampati: II  TM Distance: >3 FB Neck ROM: Full    Dental no notable dental hx.    Pulmonary sleep apnea and Continuous Positive Airway Pressure Ventilation , former smoker   Pulmonary exam normal        Cardiovascular hypertension, Pt. on medications and Pt. on home beta blockers  Rhythm:Regular Rate:Normal     Neuro/Psych negative neurological ROS  negative psych ROS   GI/Hepatic Neg liver ROS, hiatal hernia, PUD,GERD  Medicated,,  Endo/Other  diabetes, Type 2, Oral Hypoglycemic AgentsHypothyroidism    Renal/GU   negative genitourinary   Musculoskeletal  (+) Arthritis , Osteoarthritis,    Abdominal Normal abdominal exam  (+)   Peds  Hematology  (+) Blood dyscrasia, anemia   Anesthesia Other Findings   Reproductive/Obstetrics                             Anesthesia Physical Anesthesia Plan  ASA: 3  Anesthesia Plan: MAC   Post-op Pain Management:    Induction: Intravenous  PONV Risk Score and Plan: 1 and Propofol infusion and Treatment may vary due to age or medical condition  Airway Management Planned: Simple Face Mask and Nasal Cannula  Additional Equipment: None  Intra-op Plan:   Post-operative Plan:   Informed Consent: I have reviewed the patients History and Physical, chart, labs and discussed the procedure including the risks, benefits and alternatives for the proposed anesthesia with the patient or authorized representative who has indicated his/her understanding and acceptance.     Dental advisory given  Plan Discussed with: CRNA  Anesthesia Plan Comments:        Anesthesia Quick Evaluation

## 2022-09-26 NOTE — Anesthesia Procedure Notes (Signed)
Procedure Name: MAC Date/Time: 09/26/2022 8:51 AM  Performed by: Claudia Desanctis, CRNAPre-anesthesia Checklist: Patient identified, Emergency Drugs available, Suction available and Patient being monitored Patient Re-evaluated:Patient Re-evaluated prior to induction Oxygen Delivery Method: Simple face mask

## 2022-09-26 NOTE — Discharge Instructions (Addendum)
YOU HAD AN ENDOSCOPIC PROCEDURE TODAY: Refer to the procedure report and other information in the discharge instructions given to you for any specific questions about what was found during the examination. If this information does not answer your questions, please call [place] office at [phone] to clarify.   YOU SHOULD EXPECT: Some feelings of bloating in the abdomen. Passage of more gas than usual. Walking can help get rid of the air that was put into your GI tract during the procedure and reduce the bloating. If you had a lower endoscopy (such as a colonoscopy or flexible sigmoidoscopy) you may notice spotting of blood in your stool or on the toilet paper. Some abdominal soreness may be present for a day or two, also.  DIET: Your first meal following the procedure should be a light meal and then it is ok to progress to your normal diet. A half-sandwich or bowl of soup is an example of a good first meal. Heavy or fried foods are harder to digest and may make you feel nauseous or bloated. Drink plenty of fluids but you should avoid alcoholic beverages for 24 hours. I    ACTIVITY: Your care partner should take you home directly after the procedure. You should plan to take it easy, moving slowly for the rest of the day. You can resume normal activity the day after the procedure however YOU SHOULD NOT DRIVE, use power tools, machinery or perform tasks that involve climbing or major physical exertion for 24 hours (because of the sedation medicines used during the test).   SYMPTOMS TO REPORT IMMEDIATELY: A gastroenterologist can be reached at any hour. Please call [phone]  for any of the following symptoms:  Following lower endoscopy (colonoscopy, flexible sigmoidoscopy) Excessive amounts of blood in the stool  Significant tenderness, worsening of abdominal pains  Swelling of the abdomen that is new, acute  Fever of 100 or higher  Following upper endoscopy (EGD, EUS, ERCP, esophageal  dilation) Vomiting of blood or coffee ground material  New, significant abdominal pain  New, significant chest pain or pain under the shoulder blades  Painful or persistently difficult swallowing  New shortness of breath  Black, tarry-looking or red, bloody stools  FOLLOW UP:  If any biopsies were taken you will be contacted by phone or by letter within the next 1-3 weeks. Call [phone]  if you have not heard about the biopsies in 3 weeks.  Please also call with any specific questions about appointments or follow up tests.

## 2022-09-27 LAB — SURGICAL PATHOLOGY

## 2022-09-28 ENCOUNTER — Encounter (HOSPITAL_COMMUNITY): Payer: Self-pay | Admitting: Gastroenterology

## 2022-09-28 ENCOUNTER — Encounter: Payer: Self-pay | Admitting: Gastroenterology

## 2022-11-03 ENCOUNTER — Telehealth: Payer: Self-pay | Admitting: Gastroenterology

## 2022-11-03 NOTE — Telephone Encounter (Signed)
Please send this to medical records department. They probably have ROI on file to able to fax. Thanks.

## 2022-11-03 NOTE — Telephone Encounter (Signed)
Inbound call from [facility], requesting colonoscopy report from [facility] faxed to [phone].

## 2022-11-23 ENCOUNTER — Telehealth: Payer: Self-pay | Admitting: *Deleted

## 2022-11-23 ENCOUNTER — Ambulatory Visit: Payer: Medicare Other | Attending: Cardiology | Admitting: Cardiology

## 2022-11-23 ENCOUNTER — Encounter: Payer: Self-pay | Admitting: Cardiology

## 2022-11-23 VITALS — BP 122/76 | HR 76 | Ht 72.0 in | Wt 394.4 lb

## 2022-11-23 DIAGNOSIS — G4733 Obstructive sleep apnea (adult) (pediatric): Secondary | ICD-10-CM

## 2022-11-23 DIAGNOSIS — I10 Essential (primary) hypertension: Secondary | ICD-10-CM

## 2022-11-23 NOTE — Progress Notes (Addendum)
Sleep Medicine CONSULT Note    Date:  11/23/2022   ID:  Evan Berry, DOB 11/08/1955, MRN [id number]  PCP:  Rodrigo Ran, MD  Cardiologist: Kristeen Miss, MD  Chief Complaint  Patient presents with   New Patient (Initial Visit)    Obstructive sleep apnea    History of Present Illness:  Evan Berry is a 67 y.o. male who is being seen today for the evaluation of obstructive sleep apnea at the request of Kristeen Miss, MD.  This is a 67 year old male with a history of obstructive sleep apnea who apparently has been on CPAP in the past but has not seen a sleep physician in years.  He also has paroxysmal atrial fibrillation, diabetes mellitus, hypertension and morbid obesity with a BMI of 53.  He is doing well with his PAP device and thinks that he has gotten used to it.  He tolerates the full face mask and feels the pressure is adequate.  He tells me that he is only using his device about 3-4 hours nightly because he cannot sleep any longer than that.  He goes to bed between 11PM and and gets up 4am but usually is awake at 3am.  He wakes up to go to the bathroom around 3 and then cannot fall back asleep. Sometimes he will feel sleepy when he gets up in the am and in the afternoon will have to take a nap  for 1-2 hours.  He denies any significant mouth or nasal dryness or nasal congestion.  He does not think that he snores.    Past Medical History:  Diagnosis Date   Allergic rhinitis    Arthritis    Blood transfusion without reported diagnosis    Cataract    Chronic kidney disease    pt. denies   Diabetes (HCC)    type 2   Dyspnea on exertion    GERD (gastroesophageal reflux disease)    History of hiatal hernia    Hyperlipidemia    Hypertension    Hypothyroidism    Iron deficiency anemia    OSA (obstructive sleep apnea)    PUD (peptic ulcer disease)    Sleep apnea    WEARS CPAP    Past Surgical History:  Procedure Laterality Date   BACK SURGERY  11/2008   x2    bleeding ulcer surgery     CARDIOVERSION N/A 01/29/2020   Procedure: CARDIOVERSION;  Surgeon: Thurmon Fair, MD;  Location: [facility];  Service: Cardiovascular;  Laterality: N/A;   CHOLECYSTECTOMY  1998   COLONOSCOPY     COLONOSCOPY WITH PROPOFOL N/A 09/26/2022   Procedure: COLONOSCOPY WITH PROPOFOL;  Surgeon: Meryl Dare, MD;  Location: [facility];  Service: Gastroenterology;  Laterality: N/A;   ESOPHAGOGASTRODUODENOSCOPY (EGD) WITH PROPOFOL N/A 12/18/2017   Procedure: ESOPHAGOGASTRODUODENOSCOPY (EGD) WITH PROPOFOL;  Surgeon: Meryl Dare, MD;  Location: [facility];  Service: Endoscopy;  Laterality: N/A;   ESOPHAGOGASTRODUODENOSCOPY (EGD) WITH PROPOFOL N/A 09/26/2022   Procedure: ESOPHAGOGASTRODUODENOSCOPY (EGD) WITH PROPOFOL;  Surgeon: Meryl Dare, MD;  Location: [facility];  Service: Gastroenterology;  Laterality: N/A;   GASTRIC BYPASS  1983   GASTRIC RESTRICTION SURGERY     POLYPECTOMY  12/18/2017   Procedure: POLYPECTOMY;  Surgeon: Meryl Dare, MD;  Location: [facility];  Service: Endoscopy;;   POLYPECTOMY  09/26/2022   Procedure: POLYPECTOMY;  Surgeon: Meryl Dare, MD;  Location: [facility];  Service: Gastroenterology;;    Current Medications: Current Meds  Medication Sig   acetaminophen (TYLENOL) 650 MG CR tablet Take 1,950 mg by mouth at bedtime.   Alpha-Lipoic Acid 300 MG CAPS Take 300 mg by mouth daily.   amLODipine (NORVASC) 5 MG tablet Take 5 mg by mouth daily.   apixaban (ELIQUIS) 5 MG TABS tablet Take 1 tablet (5 mg total) by mouth 2 (two) times daily. appt req for refill [id number]   Cyanocobalamin (B-12) 2500 MCG TABS Take 2,500 mcg by mouth at bedtime.   ezetimibe (ZETIA) 10 MG tablet Take 10 mg by mouth daily.   glimepiride (AMARYL) 4 MG tablet Take 4 mg by mouth daily with breakfast.   levothyroxine (SYNTHROID, LEVOTHROID) 150 MCG tablet Take 150-300 mcg by mouth See admin instructions.  Take 300 mg at bedtime on Sunday. All the other  days take 150 mg at bedtime   metFORMIN (GLUCOPHAGE-XR) 500 MG 24 hr tablet Take 1,000 mg by mouth 2 (two) times daily with a meal.   metoprolol tartrate (LOPRESSOR) 25 MG tablet Take 25 mg by mouth 2 (two) times daily.   Multiple Vitamin (MULTIVITAMIN) capsule Take 1 capsule by mouth at bedtime.   Omega-3 Fatty Acids (FISH OIL) 1000 MG CAPS Take 1,000 mg by mouth in the morning.   omeprazole (PRILOSEC) 20 MG capsule TAKE 1 CAPSULE (20 MG TOTAL) BY MOUTH 2 (TWO) TIMES DAILY BEFORE A MEAL.   ONETOUCH VERIO test strip    oxyCODONE-acetaminophen (PERCOCET) 10-325 MG tablet Take 1 tablet by mouth daily as needed for pain.   ramipril (ALTACE) 10 MG capsule Take 10 mg by mouth daily.    rosuvastatin (CRESTOR) 20 MG tablet Take 20 mg by mouth daily.   triamcinolone (NASACORT ALLERGY 24HR) 55 MCG/ACT AERO nasal inhaler Place 2 sprays into the nose in the morning.   zinc gluconate 50 MG tablet Take 50 mg by mouth 2 (two) times a week.    Allergies:   Penicillins   Social History   Socioeconomic History   Marital status: Married    Spouse name: Larita Fife   Number of children: 2   Years of education: Not on file   Highest education level: Not on file  Occupational History   Occupation: MAINTENANCE    Employer: [employer]  Tobacco Use   Smoking status: Former    Packs/day: 1.00    Years: 1.00    Additional pack years: 0.00    Total pack years: 1.00    Types: Cigarettes    Quit date: 07/18/1980    Years since quitting: 42.3   Smokeless tobacco: Never  Vaping Use   Vaping Use: Never used  Substance and Sexual Activity   Alcohol use: No    Alcohol/week: 0.0 standard drinks of alcohol   Drug use: No   Sexual activity: Yes  Other Topics Concern   Not on file  Social History Narrative   Not on file   Social Determinants of Health   Financial Resource Strain: Not on file  Food Insecurity: Not on file  Transportation Needs: Not on file  Physical Activity: Not on file  Stress: Not on file   Social Connections: Not on file     Family History:  The patient's family history includes Cancer in his paternal grandmother; Heart disease in his maternal grandmother and mother; Lung cancer in his father.   ROS:   Please see the history of present illness.    ROS All other systems reviewed and are negative.      No data to  display             PHYSICAL EXAM:   VS:  BP 122/76   Pulse 76   Ht 6' (1.829 m)   Wt (!) 394 lb 6.4 oz (178.9 kg)   SpO2 95%   BMI 53.49 kg/m    GEN: Well nourished, well developed, in no acute distress  HEENT: normal  Neck: no JVD, carotid bruits, or masses Cardiac: irregularly irregular; no murmurs, rubs, or gallops,no edema.  Intact distal pulses bilaterally.  Respiratory:  clear to auscultation bilaterally, normal work of breathing GI: soft, nontender, nondistended, + BS MS: no deformity or atrophy  Skin: warm and dry, no rash Neuro:  Alert and Oriented x 3, Strength and sensation are intact Psych: euthymic mood, full affect  Wt Readings from Last 3 Encounters:  11/23/22 (!) 394 lb 6.4 oz (178.9 kg)  09/26/22 (!) 396 lb (179.6 kg)  09/13/22 (!) 396 lb 12.8 oz (180 kg)      Studies/Labs Reviewed:   none  Recent Labs: No results found for requested labs within last 365 days.    CHA2DS2-VASc Score = 4   This indicates a 4.8% annual risk of stroke. The patient's score is based upon: CHF History: 1 HTN History: 1 Diabetes History: 1 Stroke History: 0 Vascular Disease History: 0 Age Score: 1 Gender Score: 0          ASSESSMENT:    1. OSA (obstructive sleep apnea)   2. Primary hypertension      PLAN:  In order of problems listed above:  OSA - The patient is tolerating PAP therapy well without any problems. The PAP download performed by his DME was personally reviewed and interpreted by me today and showed an AHI of 9.9 /hr on auto CPAP from 5-15 cm H2O with 30% compliance in using more than 4 hours nightly.  The  patient has been using and benefiting from PAP use and will continue to benefit from therapy.  -he complains that he has nasal obstruction and cannot breathe out of his nose -continue Flonase -I encouraged him to get a nasal saline spray to use twice daily -I had recommended sending him to the sleep lab for split-night sleep study but he does not want to go back to the lab -I will order a new ResMed auto CPAP but increase the pressure to 4 to 20 cm H2O humidity and mask of choice -I will see him back 6 weeks after he gets his new device -I encouraged him to make sure that he changes out his questions on his new mask every 4 to 6 weeks to avoid leakage  Hypertension -BP is controlled on exam today -Continue prescription drug managed with amlodipine 5 mg daily, Lopressor 25 mg twice daily and Altace 10 mg daily with as needed refills   Time Spent: 20 minutes total time of encounter, including 15 minutes spent in face-to-face patient care on the date of this encounter. This time includes coordination of care and counseling regarding above mentioned problem list. Remainder of non-face-to-face time involved reviewing chart documents/testing relevant to the patient encounter and documentation in the medical record. I have independently reviewed documentation from referring provider  Medication Adjustments/Labs and Tests Ordered: Current medicines are reviewed at length with the patient today.  Concerns regarding medicines are outlined above.  Medication changes, Labs and Tests ordered today are listed in the Patient Instructions below.  There are no Patient Instructions on file for this visit.  Signed, Armanda Magic, MD  11/23/2022 9:13 AM    [facility] [address] [place], [place], [place]  [address] Phone: [phone]; Fax: [phone]

## 2022-11-23 NOTE — Telephone Encounter (Signed)
-----   Message from Luellen Pucker, RN sent at 11/23/2022  9:43 AM EDT ----- Regarding: Cpap Per Dr. Mayford Knife, patient needs RESMED auto cpap 4-20 cm H20, heated humidity and mask of choice. He also needs an in office with with [facility] for mask fitting.  Thanks, Alcario Drought

## 2022-11-23 NOTE — Telephone Encounter (Signed)
Order placed to [facility] via community message. 

## 2022-11-23 NOTE — Patient Instructions (Signed)
Medication Instructions:  Your physician recommends that you continue on your current medications as directed. Please refer to the Current Medication list given to you today.  *If you need a refill on your cardiac medications before your next appointment, please call your pharmacy*   Lab Work: None.  If you have labs (blood work) drawn today and your tests are completely normal, you will receive your results only by: MyChart Message (if you have MyChart) OR A paper copy in the mail If you have any lab test that is abnormal or we need to change your treatment, we will call you to review the results.   Testing/Procedures: None.   Follow-Up: At [facility], you and your health needs are our priority.  As part of our continuing mission to provide you with exceptional heart care, we have created designated Provider Care Teams.  These Care Teams include your primary Cardiologist (physician) and Advanced Practice Providers (APPs -  Physician Assistants and Nurse Practitioners) who all work together to provide you with the care you need, when you need it.  We recommend signing up for the patient portal called "MyChart".  Sign up information is provided on this After Visit Summary.  MyChart is used to connect with patients for Virtual Visits (Telemedicine).  Patients are able to view lab/test results, encounter notes, upcoming appointments, etc.  Non-urgent messages can be sent to your provider as well.   To learn more about what you can do with MyChart, go to [email or web address].    Your next appointment will be dependent on delivery of your cpap supplies and it will be with:     Provider:   Dr. Armanda Magic, MD   Other Instructions Dr. Mayford Knife has ordered a cpap machine and supplies for you. A member of our sleep team OR someone from your insurance or DME company may contact you to coordinate delivery.

## 2022-12-02 ENCOUNTER — Encounter: Payer: Self-pay | Admitting: Cardiology

## 2023-01-06 ENCOUNTER — Other Ambulatory Visit: Payer: Self-pay

## 2023-01-06 ENCOUNTER — Emergency Department (HOSPITAL_BASED_OUTPATIENT_CLINIC_OR_DEPARTMENT_OTHER): Payer: Medicare Other

## 2023-01-06 ENCOUNTER — Encounter (HOSPITAL_BASED_OUTPATIENT_CLINIC_OR_DEPARTMENT_OTHER): Payer: Self-pay | Admitting: Emergency Medicine

## 2023-01-06 ENCOUNTER — Emergency Department (HOSPITAL_BASED_OUTPATIENT_CLINIC_OR_DEPARTMENT_OTHER)
Admission: EM | Admit: 2023-01-06 | Discharge: 2023-01-07 | Disposition: A | Discharge status: Home / Self Care | Payer: Medicare Other | Attending: Emergency Medicine | Admitting: Emergency Medicine

## 2023-01-06 DIAGNOSIS — S2241XA Multiple fractures of ribs, right side, initial encounter for closed fracture: Secondary | ICD-10-CM | POA: Insufficient documentation

## 2023-01-06 DIAGNOSIS — E039 Hypothyroidism, unspecified: Secondary | ICD-10-CM | POA: Insufficient documentation

## 2023-01-06 DIAGNOSIS — R935 Abnormal findings on diagnostic imaging of other abdominal regions, including retroperitoneum: Secondary | ICD-10-CM | POA: Insufficient documentation

## 2023-01-06 DIAGNOSIS — Z79899 Other long term (current) drug therapy: Secondary | ICD-10-CM | POA: Diagnosis not present

## 2023-01-06 DIAGNOSIS — I10 Essential (primary) hypertension: Secondary | ICD-10-CM | POA: Diagnosis not present

## 2023-01-06 DIAGNOSIS — W19XXXA Unspecified fall, initial encounter: Secondary | ICD-10-CM | POA: Diagnosis not present

## 2023-01-06 DIAGNOSIS — R0782 Intercostal pain: Secondary | ICD-10-CM | POA: Diagnosis present

## 2023-01-06 DIAGNOSIS — Z7901 Long term (current) use of anticoagulants: Secondary | ICD-10-CM | POA: Diagnosis not present

## 2023-01-06 DIAGNOSIS — E119 Type 2 diabetes mellitus without complications: Secondary | ICD-10-CM | POA: Insufficient documentation

## 2023-01-06 DIAGNOSIS — Z7984 Long term (current) use of oral hypoglycemic drugs: Secondary | ICD-10-CM | POA: Diagnosis not present

## 2023-01-06 MED ORDER — HYDROCODONE-ACETAMINOPHEN 5-325 MG PO TABS
2.0000 | ORAL_TABLET | Freq: Once | ORAL | Status: AC
  Administered 2023-01-06: 2 via ORAL
  Filled 2023-01-06: qty 2

## 2023-01-06 NOTE — ED Notes (Signed)
Patient transported to CT 

## 2023-01-06 NOTE — ED Provider Notes (Signed)
[facility] Provider Note: Lowella Dell, MD, FACEP  CSN: [id number] MRN: [id number] ARRIVAL: 01/06/23 at 2229 ROOM: [facility]01/[facility]01   CHIEF COMPLAINT  Fall   HISTORY OF PRESENT ILLNESS  01/06/23 11:06 PM Evan Berry is a 67 y.o. male who fell this morning.  He fell onto his right shoulder.  He denies any shoulder or arm pain but he is having pain in his right upper back.  He states he feels like her ribs are grating against 1 another.  Pain is not severe at rest but when he is active or when he coughs he rates it as a 9 out of 10.   Past Medical History:  Diagnosis Date   Allergic rhinitis    Arthritis    Blood transfusion without reported diagnosis    Cataract    Chronic kidney disease    pt. denies   Diabetes (HCC)    type 2   Dyspnea on exertion    GERD (gastroesophageal reflux disease)    History of hiatal hernia    Hyperlipidemia    Hypertension    Hypothyroidism    Iron deficiency anemia    OSA (obstructive sleep apnea)    PUD (peptic ulcer disease)    Sleep apnea    WEARS CPAP    Past Surgical History:  Procedure Laterality Date   BACK SURGERY  11/2008   x2   bleeding ulcer surgery     CARDIOVERSION N/A 01/29/2020   Procedure: CARDIOVERSION;  Surgeon: Thurmon Fair, MD;  Location: [facility];  Service: Cardiovascular;  Laterality: N/A;   CHOLECYSTECTOMY  1998   COLONOSCOPY     COLONOSCOPY WITH PROPOFOL N/A 09/26/2022   Procedure: COLONOSCOPY WITH PROPOFOL;  Surgeon: Meryl Dare, MD;  Location: [facility];  Service: Gastroenterology;  Laterality: N/A;   ESOPHAGOGASTRODUODENOSCOPY (EGD) WITH PROPOFOL N/A 12/18/2017   Procedure: ESOPHAGOGASTRODUODENOSCOPY (EGD) WITH PROPOFOL;  Surgeon: Meryl Dare, MD;  Location: [facility];  Service: Endoscopy;  Laterality: N/A;   ESOPHAGOGASTRODUODENOSCOPY (EGD) WITH PROPOFOL N/A 09/26/2022   Procedure: ESOPHAGOGASTRODUODENOSCOPY (EGD) WITH PROPOFOL;  Surgeon: Meryl Dare, MD;  Location: [facility];  Service: Gastroenterology;  Laterality: N/A;   GASTRIC BYPASS  1983   GASTRIC RESTRICTION SURGERY     POLYPECTOMY  12/18/2017   Procedure: POLYPECTOMY;  Surgeon: Meryl Dare, MD;  Location: [facility];  Service: Endoscopy;;   POLYPECTOMY  09/26/2022   Procedure: POLYPECTOMY;  Surgeon: Meryl Dare, MD;  Location: [facility];  Service: Gastroenterology;;    Family History  Problem Relation Age of Onset   Heart disease Maternal Grandmother    Heart disease Mother    Cancer Paternal Grandmother        unsure what kind   Lung cancer Father    Colon cancer Neg Hx    Esophageal cancer Neg Hx    Rectal cancer Neg Hx    Stomach cancer Neg Hx     Social History   Tobacco Use   Smoking status: Former    Packs/day: 1.00    Years: 1.00    Additional pack years: 0.00    Total pack years: 1.00    Types: Cigarettes    Quit date: 07/18/1980    Years since quitting: 42.5   Smokeless tobacco: Never  Vaping Use   Vaping Use: Never used  Substance Use Topics   Alcohol use: No    Alcohol/week: 0.0 standard drinks of alcohol   Drug use: No  Prior to Admission medications   Medication Sig Start Date End Date Taking? Authorizing Provider  HYDROcodone-acetaminophen (NORCO) 10-325 MG tablet Take 1 tablet by mouth every 6 (six) hours as needed for severe pain or moderate pain. 01/07/23  Yes Simcha Farrington, MD  acetaminophen (TYLENOL) 650 MG CR tablet Take 1,950 mg by mouth at bedtime.    [provider]  Alpha-Lipoic Acid 300 MG CAPS Take 300 mg by mouth daily.    [provider]  amLODipine (NORVASC) 5 MG tablet Take 5 mg by mouth daily.    [provider]  apixaban (ELIQUIS) 5 MG TABS tablet Take 1 tablet (5 mg total) by mouth 2 (two) times daily. appt req for refill [id number] 01/31/22   Newman Nip, NP  Cyanocobalamin (B-12) 2500 MCG TABS Take 2,500 mcg by mouth at bedtime.    [provider]  ezetimibe (ZETIA) 10 MG tablet Take 10  mg by mouth daily.    [provider]  glimepiride (AMARYL) 4 MG tablet Take 4 mg by mouth daily with breakfast.    [provider]  levothyroxine (SYNTHROID, LEVOTHROID) 150 MCG tablet Take 150-300 mcg by mouth See admin instructions.  Take 300 mg at bedtime on Sunday. All the other days take 150 mg at bedtime    [provider]  metFORMIN (GLUCOPHAGE-XR) 500 MG 24 hr tablet Take 1,000 mg by mouth 2 (two) times daily with a meal. 10/12/17   [provider]  metoprolol tartrate (LOPRESSOR) 25 MG tablet Take 25 mg by mouth 2 (two) times daily.    [provider]  Multiple Vitamin (MULTIVITAMIN) capsule Take 1 capsule by mouth at bedtime.    [provider]  Omega-3 Fatty Acids (FISH OIL) 1000 MG CAPS Take 1,000 mg by mouth in the morning.    [provider]  omeprazole (PRILOSEC) 20 MG capsule TAKE 1 CAPSULE (20 MG TOTAL) BY MOUTH 2 (TWO) TIMES DAILY BEFORE A MEAL. 12/25/18   Meryl Dare, MD  [facility] VERIO test strip  10/14/19   [provider]  ramipril (ALTACE) 10 MG capsule Take 10 mg by mouth daily.  10/03/19   [provider]  rosuvastatin (CRESTOR) 20 MG tablet Take 20 mg by mouth daily. 07/05/22   [provider]  triamcinolone (NASACORT ALLERGY 24HR) 55 MCG/ACT AERO nasal inhaler Place 2 sprays into the nose in the morning.    [provider]  zinc gluconate 50 MG tablet Take 50 mg by mouth 2 (two) times a week.    [provider]    Allergies Penicillins   REVIEW OF SYSTEMS  Negative except as noted here or in the History of Present Illness.   PHYSICAL EXAMINATION  Initial Vital Signs Blood pressure (!) 178/94, pulse (!) 111, temperature 97.9 F (36.6 C), temperature source Oral, resp. rate (!) 24, height 6' (1.829 m), weight (!) 181.4 kg, SpO2 96 %.  Examination General: Well-developed, high BMI male in no acute distress; appearance consistent with age of record HENT:  normocephalic; atraumatic Eyes: Normal appearance Neck: supple Heart: regular rate and rhythm Lungs: clear to auscultation bilaterally Chest: Right upper posterior rib tenderness Abdomen: soft; nondistended; nontender; bowel sounds present Extremities: No deformity; full range of motion Neurologic: Awake, alert and oriented; motor function intact in all extremities and symmetric; no facial droop Skin: Warm and dry Psychiatric: Normal mood and affect   RESULTS  Summary of this visit's results, reviewed and interpreted by myself:   EKG Interpretation  Date/Time:    Ventricular Rate:    PR Interval:    QRS Duration:   QT Interval:    QTC Calculation:   R Axis:     Text Interpretation:         Laboratory Studies: Results for orders placed or performed during the hospital encounter of 01/06/23 (from the past 24 hour(s))  CBC with Differential     Status: Abnormal   Collection Time: 01/07/23 12:18 AM  Result Value Ref Range   WBC 11.3 (H) 4.0 - 10.5 K/uL   RBC 4.65 4.22 - 5.81 MIL/uL   Hemoglobin 12.4 (L) 13.0 - 17.0 g/dL   HCT 56.4 33.2 - 95.1 %   MCV 85.6 80.0 - 100.0 fL   MCH 26.7 26.0 - 34.0 pg   MCHC 31.2 30.0 - 36.0 g/dL   RDW 88.4 16.6 - 06.3 %   Platelets 205 150 - 400 K/uL   nRBC 0.0 0.0 - 0.2 %   Neutrophils Relative % 91 %   Neutro Abs 10.3 (H) 1.7 - 7.7 K/uL   Lymphocytes Relative 6 %   Lymphs Abs 0.7 0.7 - 4.0 K/uL   Monocytes Relative 1 %   Monocytes Absolute 0.1 0.1 - 1.0 K/uL   Eosinophils Relative 0 %   Eosinophils Absolute 0.0 0.0 - 0.5 K/uL   Basophils Relative 0 %   Basophils Absolute 0.0 0.0 - 0.1 K/uL   Immature Granulocytes 2 %   Abs Immature Granulocytes 0.17 (H) 0.00 - 0.07 K/uL  Basic metabolic panel     Status: Abnormal   Collection Time: 01/07/23 12:18 AM  Result Value Ref Range   Sodium 134 (L) 135 - 145 mmol/L   Potassium 5.6 (H) 3.5 - 5.1 mmol/L   Chloride 102 98 - 111 mmol/L   CO2 19 (L) 22 - 32 mmol/L   Glucose, Bld 362 (H) 70  - 99 mg/dL   BUN 29 (H) 8 - 23 mg/dL   Creatinine, Ser 0.16 0.61 - 1.24 mg/dL   Calcium 9.2 8.9 - 01.0 mg/dL   GFR, Estimated >93 >23 mL/min   Anion gap 13 5 - 15   Imaging Studies: CT CHEST ABDOMEN PELVIS W CONTRAST  Result Date: 01/07/2023 CLINICAL DATA:  retroperitoneal mass Fall this morning on concrete from standing landing on right side with pain and ribcage radiating to the back. EXAM: CT CHEST, ABDOMEN, AND PELVIS WITH CONTRAST TECHNIQUE: Multidetector CT imaging of the chest, abdomen and pelvis was performed following the standard protocol during bolus administration of intravenous contrast. RADIATION DOSE REDUCTION: This exam was performed according to the departmental dose-optimization program which includes automated exposure control, adjustment of the mA and/or kV according to patient size and/or use of iterative reconstruction technique. CONTRAST:  OMNIPAQUE IOHEXOL 300 MG/ML  SOLN COMPARISON:  CT chest 01/06/2023 FINDINGS: CT CHEST FINDINGS Cardiovascular: Normal heart size. No significant pericardial effusion. The thoracic aorta is normal in caliber. Mild atherosclerotic plaque of the thoracic aorta. At least 2 vessel coronary artery calcifications. Mediastinum/Nodes: No enlarged mediastinal, hilar, or axillary lymph nodes. Thyroid gland, trachea, and esophagus demonstrate no significant findings. Lungs/Pleura: No focal consolidation. No pulmonary nodule. No pulmonary mass. No pleural effusion. No pneumothorax. Musculoskeletal: Bilateral gynecomastia. No suspicious lytic or blastic osseous lesions. Redemonstration of acute right nondisplaced fifth through seventh rib fractures. Multilevel degenerative changes of the spine. CT ABDOMEN PELVIS FINDINGS Hepatobiliary: No focal liver abnormality. Status post cholecystectomy. No biliary dilatation. Pancreas: A 9.4 x 1.4 cm peripherally calcified  hypodense lesion along the pancreatic body and tail may represent a pseudocyst. No focal lesion.  Normal pancreatic contour. No surrounding inflammatory changes. No main pancreatic ductal dilatation. Spleen: Normal in size without focal abnormality. Adrenals/Urinary Tract: Poorly delineated 10.5 x 4.5 cm left adrenal gland. No right adrenal gland nodule. Bilateral kidneys enhance symmetrically. Fluid density lesion within the right kidney likely represents a simple renal cyst. Simple renal cysts, in the absence of clinically indicated signs/symptoms, require no independent follow-up. No hydronephrosis. No hydroureter. The urinary bladder is unremarkable. On delayed imaging, there is no urothelial wall thickening and there are no filling defects in the opacified portions of the bilateral collecting systems or ureters. 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 Stomach is within normal limits. No evidence of bowel wall thickening or dilatation. Appendix appears normal. Vascular/Lymphatic: No abdominal aorta or iliac aneurysm. Mild atherosclerotic plaque of the aorta and its branches. No abdominal, pelvic, or inguinal lymphadenopathy. Reproductive: Prostate is unremarkable. Other: No intraperitoneal free fluid. No intraperitoneal free gas. No organized fluid collection. Musculoskeletal: Small to moderate volume supraumbilical ventral hernia containing fat with an abdominal defect of 7.3 x 2.6 cm. No suspicious lytic or blastic osseous lesions. No acute displaced fracture. L3-L4 posterolateral interbody surgical hardware fusion. Multilevel intervertebral disc space vacuum phenomena. IMPRESSION: 1. Acute right nondisplaced 5-7 rib fractures. No associated pneumothorax. 2. Poorly delineated 10.5 x 4.5 cm left adrenal gland. Question hematoma in the setting of trauma. No active extravasation noted on the delayed view. Underlying mass not excluded-consider follow-up CT or MRI adrenal gland protocol in 1 year to evaluate for resolution/stability. 3. A 9.4 x 1.4 cm peripherally  calcified hypodense lesion along the pancreatic body and tail may represent a pseudocyst. 4. Small to moderate volume supraumbilical ventral hernia containing fat with an abdominal defect of 7.3 x 2.6 cm. 5.  Aortic Atherosclerosis (ICD10-I70.0). Electronically Signed   By: Tish Frederickson M.D.   On: 01/07/2023 01:47   CT Chest Wo Contrast  Result Date: 01/06/2023 CLINICAL DATA:  Fall this morning on concrete from standing landing on right side with pain and ribcage radiating to the back. EXAM: CT CHEST WITHOUT CONTRAST TECHNIQUE: Multidetector CT imaging of the chest was performed following the standard protocol without IV contrast. RADIATION DOSE REDUCTION: This exam was performed according to the departmental dose-optimization program which includes automated exposure control, adjustment of the mA and/or kV according to patient size and/or use of iterative reconstruction technique. COMPARISON:  Chest radiograph 01/06/2023 FINDINGS: Cardiovascular: Normal heart size. No pericardial effusion. Coronary artery and aortic atherosclerotic calcification. Mediastinum/Nodes: No thoracic adenopathy. Unremarkable trachea and esophagus. Lungs/Pleura: No focal consolidation, pleural effusion, or pneumothorax. Lingular atelectasis/scarring. Upper Abdomen: Ill-defined irregular nodule measuring 5.1 x 2.8 cm in the left upper retroperitoneum with adjacent stranding (3/153). Postoperative change about the stomach. Cholecystectomy. No acute abnormality. Musculoskeletal: Acute mildly displaced lateral right 5th-7th rib fractures. Thoracic spondylosis. IMPRESSION: 1. Acute mildly displaced lateral right 5th-7th rib fractures. No pneumothorax. 2. Indeterminate ill-defined irregular mass measuring 5.1 cm in the left upper retroperitoneum with adjacent stranding. Recommend CT abdomen and pelvis with IV contrast for further evaluation. Aortic Atherosclerosis (ICD10-I70.0). Electronically Signed   By: Minerva Fester M.D.   On:  01/06/2023 23:49   DG Chest 2 View  Result Date: 01/06/2023 CLINICAL DATA:  Status post fall. EXAM: CHEST - 2 VIEW COMPARISON:  January 20, 2011 FINDINGS: The heart size and mediastinal contours are within normal limits. Both lungs are clear. Radiopaque  surgical clips are seen within the right upper quadrant. The visualized skeletal structures are unremarkable. IMPRESSION: No active cardiopulmonary disease. Electronically Signed   By: Aram Candela M.D.   On: 01/06/2023 22:59    ED COURSE and MDM  Nursing notes, initial and subsequent vitals signs, including pulse oximetry, reviewed and interpreted by myself.  Vitals:   01/06/23 2236 01/06/23 2245 01/07/23 0044 01/07/23 0141  BP: (!) 178/94   (!) 134/102  Pulse: (!) 111   (!) 116  Resp: (!) 24   19  Temp: 97.9 F (36.6 C)   98.2 F (36.8 C)  TempSrc: Oral   Oral  SpO2: 96%  98% 95%  Weight:  (!) 181.4 kg    Height:  6' (1.829 m)     Medications  HYDROcodone-acetaminophen (NORCO/VICODIN) 5-325 MG per tablet 2 tablet (2 tablets Oral Given 01/06/23 2319)  iohexol (OMNIPAQUE) 300 MG/ML solution 100 mL (100 mLs Intravenous Contrast Given 01/07/23 0109)   2:05 AM Noncontrasted CT scan shows 3 right rib fractures without pneumothorax.  We will treat his pain.  Will provide an incentive spirometer.  The etiology of the retroperitoneal opacity on the left is unclear.  It could be due to trauma although he is having no pain in his abdomen.  He will follow-up with his PCP, Dr. Waynard Edwards, regarding follow-up imaging   PROCEDURES  Procedures   ED DIAGNOSES     ICD-10-CM   1. Closed fracture of multiple ribs of right side, initial encounter  S22.41XA     2. Fall, initial encounter  W19.XXXA     3. Abnormal CT of the abdomen  R93.5          Rafeal Skibicki, Jonny Ruiz, MD 01/07/23 [phone]

## 2023-01-06 NOTE — ED Triage Notes (Signed)
Pt reports having a fall this morning on concrete from standing, landed on right side, reports pain in ribcage from right that radiates to the back

## 2023-01-07 ENCOUNTER — Emergency Department (HOSPITAL_BASED_OUTPATIENT_CLINIC_OR_DEPARTMENT_OTHER): Payer: Medicare Other

## 2023-01-07 ENCOUNTER — Encounter (HOSPITAL_BASED_OUTPATIENT_CLINIC_OR_DEPARTMENT_OTHER): Payer: Self-pay

## 2023-01-07 LAB — BASIC METABOLIC PANEL
Anion gap: 13 (ref 5–15)
BUN: 29 mg/dL — ABNORMAL HIGH (ref 8–23)
CO2: 19 mmol/L — ABNORMAL LOW (ref 22–32)
Calcium: 9.2 mg/dL (ref 8.9–10.3)
Chloride: 102 mmol/L (ref 98–111)
Creatinine, Ser: 1.22 mg/dL (ref 0.61–1.24)
GFR, Estimated: >60 mL/min (ref >60)
Glucose, Bld: 362 mg/dL — ABNORMAL HIGH (ref 70–99)
Potassium: 5.6 mmol/L — ABNORMAL HIGH (ref 3.5–5.1)
Sodium: 134 mmol/L — ABNORMAL LOW (ref 135–145)

## 2023-01-07 LAB — CBC WITH DIFFERENTIAL/PLATELET
Abs Immature Granulocytes: 0.17 10*3/uL — ABNORMAL HIGH (ref 0.00–0.07)
Basophils Absolute: 0 10*3/uL (ref 0.0–0.1)
Basophils Relative: 0 %
Eosinophils Absolute: 0 10*3/uL (ref 0.0–0.5)
Eosinophils Relative: 0 %
HCT: 39.8 % (ref 39.0–52.0)
Hemoglobin: 12.4 g/dL — ABNORMAL LOW (ref 13.0–17.0)
Immature Granulocytes: 2 %
Lymphocytes Relative: 6 %
Lymphs Abs: 0.7 10*3/uL (ref 0.7–4.0)
MCH: 26.7 pg (ref 26.0–34.0)
MCHC: 31.2 g/dL (ref 30.0–36.0)
MCV: 85.6 fL (ref 80.0–100.0)
Monocytes Absolute: 0.1 10*3/uL (ref 0.1–1.0)
Monocytes Relative: 1 %
Neutro Abs: 10.3 10*3/uL — ABNORMAL HIGH (ref 1.7–7.7)
Neutrophils Relative %: 91 %
Platelets: 205 10*3/uL (ref 150–400)
RBC: 4.65 MIL/uL (ref 4.22–5.81)
RDW: 14.5 % (ref 11.5–15.5)
WBC: 11.3 10*3/uL — ABNORMAL HIGH (ref 4.0–10.5)
nRBC: 0 % (ref 0.0–0.2)

## 2023-01-07 MED ORDER — IOHEXOL 300 MG/ML  SOLN
100.0000 mL | Freq: Once | INTRAMUSCULAR | Status: AC | PRN
  Administered 2023-01-07: 100 mL via INTRAVENOUS

## 2023-01-07 MED ORDER — HYDROCODONE-ACETAMINOPHEN 10-325 MG PO TABS: 1.0000 | ORAL_TABLET | Freq: Four times a day (QID) | ORAL | 0 refills | Status: AC | PRN

## 2023-01-11 ENCOUNTER — Ambulatory Visit: Payer: Medicare Other | Admitting: Cardiology

## 2023-01-23 ENCOUNTER — Other Ambulatory Visit: Payer: Self-pay | Admitting: Family Medicine

## 2023-01-23 DIAGNOSIS — K863 Pseudocyst of pancreas: Secondary | ICD-10-CM

## 2023-01-23 DIAGNOSIS — E279 Disorder of adrenal gland, unspecified: Secondary | ICD-10-CM

## 2023-02-23 ENCOUNTER — Encounter: Payer: Self-pay | Admitting: Cardiology

## 2023-02-23 ENCOUNTER — Telehealth: Payer: Self-pay | Admitting: *Deleted

## 2023-02-23 ENCOUNTER — Ambulatory Visit: Payer: Medicare Other | Admitting: Cardiology

## 2023-02-23 VITALS — BP 142/82 | HR 77 | Ht 72.0 in | Wt 394.0 lb

## 2023-02-23 DIAGNOSIS — G4733 Obstructive sleep apnea (adult) (pediatric): Secondary | ICD-10-CM

## 2023-02-23 DIAGNOSIS — I10 Essential (primary) hypertension: Secondary | ICD-10-CM

## 2023-02-23 NOTE — Progress Notes (Signed)
Sleep Medicine Note    Date:  02/23/2023   ID:  [name], DOB 09/03/1955, MRN [id number]  PCP:  Rodrigo Ran, MD  Cardiologist: Kristeen Miss, MD  Chief Complaint  Patient presents with   Sleep Apnea   Hypertension    History of Present Illness:  Evan Berry is a 67 y.o. male with a history of obstructive sleep apnea who apparently has been on CPAP in the past but has not seen a sleep physician in years.  He also has paroxysmal atrial fibrillation, diabetes mellitus, hypertension and morbid obesity with a BMI of 53.  At last office visit he was having problems with his CPAP.  He was complaining that his nose was getting stuffed up and he could not breathe and I encouraged him to use Flonase and saline spray.  His AHI was also elevated and he needed a new device so we ordered him a new CPAP device increase auto CPAP to 4 to 20 cm H2O.  He is now back for follow-up.  He is doing well with his PAP device and thinks that He has gotten used to it.  He tolerates the mask and feels the pressure is adequate.  Since going on PAP he feels rested in the am and has no significant daytime sleepiness.  He denies any significant mouth or nasal dryness or nasal congestion.  He does not think that he snores.    Past Medical History:  Diagnosis Date   Allergic rhinitis    Arthritis    Blood transfusion without reported diagnosis    Cataract    Chronic kidney disease    pt. denies   Diabetes (HCC)    type 2   Dyspnea on exertion    GERD (gastroesophageal reflux disease)    History of hiatal hernia    Hyperlipidemia    Hypertension    Hypothyroidism    Iron deficiency anemia    OSA (obstructive sleep apnea)    PUD (peptic ulcer disease)    Sleep apnea    WEARS CPAP    Past Surgical History:  Procedure Laterality Date   BACK SURGERY  11/2008   x2   bleeding ulcer surgery     CARDIOVERSION N/A 01/29/2020   Procedure: CARDIOVERSION;  Surgeon: Thurmon Fair, MD;  Location:  [facility];  Service: Cardiovascular;  Laterality: N/A;   CHOLECYSTECTOMY  1998   COLONOSCOPY     COLONOSCOPY WITH PROPOFOL N/A 09/26/2022   Procedure: COLONOSCOPY WITH PROPOFOL;  Surgeon: Meryl Dare, MD;  Location: [facility];  Service: Gastroenterology;  Laterality: N/A;   ESOPHAGOGASTRODUODENOSCOPY (EGD) WITH PROPOFOL N/A 12/18/2017   Procedure: ESOPHAGOGASTRODUODENOSCOPY (EGD) WITH PROPOFOL;  Surgeon: Meryl Dare, MD;  Location: [facility];  Service: Endoscopy;  Laterality: N/A;   ESOPHAGOGASTRODUODENOSCOPY (EGD) WITH PROPOFOL N/A 09/26/2022   Procedure: ESOPHAGOGASTRODUODENOSCOPY (EGD) WITH PROPOFOL;  Surgeon: Meryl Dare, MD;  Location: [facility];  Service: Gastroenterology;  Laterality: N/A;   GASTRIC BYPASS  1983   GASTRIC RESTRICTION SURGERY     POLYPECTOMY  12/18/2017   Procedure: POLYPECTOMY;  Surgeon: Meryl Dare, MD;  Location: [facility];  Service: Endoscopy;;   POLYPECTOMY  09/26/2022   Procedure: POLYPECTOMY;  Surgeon: Meryl Dare, MD;  Location: [facility];  Service: Gastroenterology;;    Current Medications: Current Meds  Medication Sig   acetaminophen (TYLENOL) 650 MG CR tablet Take 1,950 mg by mouth at bedtime.   Alpha-Lipoic Acid 300 MG CAPS Take 300  mg by mouth daily.   amLODipine (NORVASC) 5 MG tablet Take 5 mg by mouth daily.   apixaban (ELIQUIS) 5 MG TABS tablet Take 1 tablet (5 mg total) by mouth 2 (two) times daily. appt req for refill [phone]   Cyanocobalamin (B-12) 2500 MCG TABS Take 2,500 mcg by mouth at bedtime.   ezetimibe (ZETIA) 10 MG tablet Take 10 mg by mouth daily.   glimepiride (AMARYL) 4 MG tablet Take 4 mg by mouth daily with breakfast.   HYDROcodone-acetaminophen (NORCO) 10-325 MG tablet Take 1 tablet by mouth every 6 (six) hours as needed for severe pain or moderate pain.   levothyroxine (SYNTHROID, LEVOTHROID) 150 MCG tablet Take 150-300 mcg by mouth See admin instructions.  Take 300 mg at bedtime on Sunday. All  the other days take 150 mg at bedtime   metFORMIN (GLUCOPHAGE-XR) 500 MG 24 hr tablet Take 1,000 mg by mouth 2 (two) times daily with a meal.   metoprolol tartrate (LOPRESSOR) 25 MG tablet Take 25 mg by mouth 2 (two) times daily.   Multiple Vitamin (MULTIVITAMIN) capsule Take 1 capsule by mouth at bedtime.   Omega-3 Fatty Acids (FISH OIL) 1000 MG CAPS Take 1,000 mg by mouth in the morning.   omeprazole (PRILOSEC) 20 MG capsule TAKE 1 CAPSULE (20 MG TOTAL) BY MOUTH 2 (TWO) TIMES DAILY BEFORE A MEAL.   ONETOUCH VERIO test strip    ramipril (ALTACE) 10 MG capsule Take 10 mg by mouth 2 (two) times daily.   rosuvastatin (CRESTOR) 20 MG tablet Take 20 mg by mouth daily.   triamcinolone (NASACORT ALLERGY 24HR) 55 MCG/ACT AERO nasal inhaler Place 2 sprays into the nose in the morning.   zinc gluconate 50 MG tablet Take 50 mg by mouth 2 (two) times a week.    Allergies:   Penicillins   Social History   Socioeconomic History   Marital status: Married    Spouse name: Larita Fife   Number of children: 2   Years of education: Not on file   Highest education level: Not on file  Occupational History   Occupation: MAINTENANCE    Employer: [employer]  Tobacco Use   Smoking status: Former    Current packs/day: 0.00    Average packs/day: 1 pack/day for 1 year (1.0 ttl pk-yrs)    Types: Cigarettes    Start date: 07/19/1979    Quit date: 07/18/1980    Years since quitting: 42.6   Smokeless tobacco: Never  Vaping Use   Vaping status: Never Used  Substance and Sexual Activity   Alcohol use: No    Alcohol/week: 0.0 standard drinks of alcohol   Drug use: No   Sexual activity: Yes  Other Topics Concern   Not on file  Social History Narrative   Not on file   Social Determinants of Health   Financial Resource Strain: Not on file  Food Insecurity: Not on file  Transportation Needs: Not on file  Physical Activity: Not on file  Stress: Not on file  Social Connections: Not on file     Family History:   The patient's family history includes Cancer in his paternal grandmother; Heart disease in his maternal grandmother and mother; Lung cancer in his father.   ROS:   Please see the history of present illness.    ROS All other systems reviewed and are negative.      No data to display             PHYSICAL EXAM:  VS:  BP (!) 142/82   Pulse 77   Ht 6' (1.829 m)   Wt (!) 394 lb (178.7 kg)   SpO2 98%   BMI 53.44 kg/m    GEN: Well nourished, well developed in no acute distress HEENT: Normal NECK: No JVD; No carotid bruits LYMPHATICS: No lymphadenopathy CARDIAC:RRR, no murmurs, rubs, gallops RESPIRATORY:  Clear to auscultation without rales, wheezing or rhonchi  ABDOMEN: Soft, non-tender, non-distended MUSCULOSKELETAL:  No edema; No deformity  SKIN: Warm and dry NEUROLOGIC:  Alert and oriented x 3 PSYCHIATRIC:  Normal affect  Wt Readings from Last 3 Encounters:  02/23/23 (!) 394 lb (178.7 kg)  01/06/23 (!) 400 lb (181.4 kg)  11/23/22 (!) 394 lb 6.4 oz (178.9 kg)      Studies/Labs Reviewed:   none  Recent Labs: 01/07/2023: BUN 29; Creatinine, Ser 1.22; Hemoglobin 12.4; Platelets 205; Potassium 5.6; Sodium 134    CHA2DS2-VASc Score = 4   This indicates a 4.8% annual risk of stroke. The patient's score is based upon: CHF History: 1 HTN History: 1 Diabetes History: 1 Stroke History: 0 Vascular Disease History: 0 Age Score: 1 Gender Score: 0         ASSESSMENT:    1. OSA (obstructive sleep apnea)   2. Primary hypertension       PLAN:  In order of problems listed above:  OSA - The patient is tolerating PAP therapy well without any problems. The PAP download performed by his DME was personally reviewed and interpreted by me today and showed an AHI of 9 /hr on auto CPAP from 4-20  cm H2O with 100 % compliance in using more than 4 hours nightly.  The patient has been using and benefiting from PAP use and will continue to benefit from therapy.  -His download  today shows a significant mask leak.  He uses a FFM.  He has not changed out the cushion on the mask in the past 3 months -He also has a beard and says that he will not shave his beard -I will repeat a download in 4 weeks -I will order him new supplies  Hypertension -BP is controlled on exam today -Continue prescription drug management with amlodipine 5 mg daily, Lopressor 25 mg twice daily, ramipril 10 mg daily with as needed refills  Followup with me in 1 year  Time Spent: 20 minutes total time of encounter, including 15 minutes spent in face-to-face patient care on the date of this encounter. This time includes coordination of care and counseling regarding above mentioned problem list. Remainder of non-face-to-face time involved reviewing chart documents/testing relevant to the patient encounter and documentation in the medical record. I have independently reviewed documentation from referring provider  Medication Adjustments/Labs and Tests Ordered: Current medicines are reviewed at length with the patient today.  Concerns regarding medicines are outlined above.  Medication changes, Labs and Tests ordered today are listed in the Patient Instructions below.  There are no Patient Instructions on file for this visit.   Signed, Armanda Magic, MD  02/23/2023 9:14 AM    [facility] [address] [place], [place], [place]  [address] Phone: [phone]; Fax: [phone]

## 2023-02-23 NOTE — Telephone Encounter (Signed)
-----   Message from Nurse Alcario Drought E sent at 02/23/2023  9:20 AM EDT ----- Regarding: D/L in 4 weeks, mask supplies Dr. Mayford Knife would like to order new cpap supplies ASAP and would also like a D/L in 4 weeks. Alcario Drought

## 2023-02-23 NOTE — Telephone Encounter (Signed)
Order placed to [facility] via community message. 

## 2023-02-23 NOTE — Patient Instructions (Signed)
Medication Instructions:  Your physician recommends that you continue on your current medications as directed. Please refer to the Current Medication list given to you today.  *If you need a refill on your cardiac medications before your next appointment, please call your pharmacy*   Lab Work: None.  If you have labs (blood work) drawn today and your tests are completely normal, you will receive your results only by: MyChart Message (if you have MyChart) OR A paper copy in the mail If you have any lab test that is abnormal or we need to change your treatment, we will call you to review the results.   Testing/Procedures: None.   Follow-Up: At [facility], you and your health needs are our priority.  As part of our continuing mission to provide you with exceptional heart care, we have created designated Provider Care Teams.  These Care Teams include your primary Cardiologist (physician) and Advanced Practice Providers (APPs -  Physician Assistants and Nurse Practitioners) who all work together to provide you with the care you need, when you need it.  We recommend signing up for the patient portal called "MyChart".  Sign up information is provided on this After Visit Summary.  MyChart is used to connect with patients for Virtual Visits (Telemedicine).  Patients are able to view lab/test results, encounter notes, upcoming appointments, etc.  Non-urgent messages can be sent to your provider as well.   To learn more about what you can do with MyChart, go to [email or web address].    Your next appointment:   1 year(s)  Provider:   Dr. Armanda Magic, MD   Other Instructions Dr. Mayford Knife has sent in new orders for cpap supplies and for a download from your device in 4 weeks.

## 2023-04-02 ENCOUNTER — Ambulatory Visit
Admission: RE | Admit: 2023-04-02 | Discharge: 2023-04-02 | Disposition: A | Discharge status: Home / Self Care | Payer: Medicare Other | Source: Ambulatory Visit | Attending: Family Medicine | Admitting: Family Medicine

## 2023-04-02 DIAGNOSIS — K863 Pseudocyst of pancreas: Secondary | ICD-10-CM

## 2023-04-02 DIAGNOSIS — E279 Disorder of adrenal gland, unspecified: Secondary | ICD-10-CM

## 2023-04-02 MED ORDER — GADOPICLENOL 0.5 MMOL/ML IV SOLN
10.0000 mL | Freq: Once | INTRAVENOUS | Status: AC | PRN
  Administered 2023-04-02: 10 mL via INTRAVENOUS

## 2023-12-16 ENCOUNTER — Emergency Department (HOSPITAL_COMMUNITY)

## 2023-12-16 ENCOUNTER — Encounter (HOSPITAL_COMMUNITY): Payer: Self-pay | Admitting: Emergency Medicine

## 2023-12-16 ENCOUNTER — Other Ambulatory Visit: Payer: Self-pay

## 2023-12-16 ENCOUNTER — Inpatient Hospital Stay (HOSPITAL_COMMUNITY)
Admission: EM | Admit: 2023-12-16 | Discharge: 2023-12-22 | DRG: 871 | Disposition: A | Discharge status: Home / Self Care | Attending: Family Medicine | Admitting: Family Medicine

## 2023-12-16 DIAGNOSIS — N12 Tubulo-interstitial nephritis, not specified as acute or chronic: Secondary | ICD-10-CM | POA: Diagnosis present

## 2023-12-16 DIAGNOSIS — I10 Essential (primary) hypertension: Secondary | ICD-10-CM

## 2023-12-16 DIAGNOSIS — Z87891 Personal history of nicotine dependence: Secondary | ICD-10-CM

## 2023-12-16 DIAGNOSIS — Z79890 Hormone replacement therapy (postmenopausal): Secondary | ICD-10-CM

## 2023-12-16 DIAGNOSIS — J181 Lobar pneumonia, unspecified organism: Secondary | ICD-10-CM | POA: Diagnosis not present

## 2023-12-16 DIAGNOSIS — E039 Hypothyroidism, unspecified: Secondary | ICD-10-CM | POA: Diagnosis present

## 2023-12-16 DIAGNOSIS — N1832 Chronic kidney disease, stage 3b: Secondary | ICD-10-CM | POA: Diagnosis present

## 2023-12-16 DIAGNOSIS — Z79899 Other long term (current) drug therapy: Secondary | ICD-10-CM

## 2023-12-16 DIAGNOSIS — Z809 Family history of malignant neoplasm, unspecified: Secondary | ICD-10-CM

## 2023-12-16 DIAGNOSIS — I4819 Other persistent atrial fibrillation: Secondary | ICD-10-CM | POA: Diagnosis present

## 2023-12-16 DIAGNOSIS — E66813 Obesity, class 3: Secondary | ICD-10-CM | POA: Diagnosis present

## 2023-12-16 DIAGNOSIS — Z8711 Personal history of peptic ulcer disease: Secondary | ICD-10-CM

## 2023-12-16 DIAGNOSIS — Z6841 Body Mass Index (BMI) 40.0 and over, adult: Secondary | ICD-10-CM

## 2023-12-16 DIAGNOSIS — Z91148 Patient's other noncompliance with medication regimen for other reason: Secondary | ICD-10-CM

## 2023-12-16 DIAGNOSIS — Z9884 Bariatric surgery status: Secondary | ICD-10-CM

## 2023-12-16 DIAGNOSIS — E782 Mixed hyperlipidemia: Secondary | ICD-10-CM | POA: Diagnosis present

## 2023-12-16 DIAGNOSIS — A419 Sepsis, unspecified organism: Secondary | ICD-10-CM | POA: Diagnosis not present

## 2023-12-16 DIAGNOSIS — R6510 Systemic inflammatory response syndrome (SIRS) of non-infectious origin without acute organ dysfunction: Secondary | ICD-10-CM | POA: Diagnosis not present

## 2023-12-16 DIAGNOSIS — I5031 Acute diastolic (congestive) heart failure: Secondary | ICD-10-CM | POA: Insufficient documentation

## 2023-12-16 DIAGNOSIS — E1122 Type 2 diabetes mellitus with diabetic chronic kidney disease: Secondary | ICD-10-CM | POA: Diagnosis present

## 2023-12-16 DIAGNOSIS — I4811 Longstanding persistent atrial fibrillation: Secondary | ICD-10-CM | POA: Diagnosis present

## 2023-12-16 DIAGNOSIS — N170 Acute kidney failure with tubular necrosis: Secondary | ICD-10-CM | POA: Diagnosis present

## 2023-12-16 DIAGNOSIS — Z8249 Family history of ischemic heart disease and other diseases of the circulatory system: Secondary | ICD-10-CM

## 2023-12-16 DIAGNOSIS — Z9049 Acquired absence of other specified parts of digestive tract: Secondary | ICD-10-CM

## 2023-12-16 DIAGNOSIS — R6520 Severe sepsis without septic shock: Secondary | ICD-10-CM | POA: Diagnosis present

## 2023-12-16 DIAGNOSIS — K219 Gastro-esophageal reflux disease without esophagitis: Secondary | ICD-10-CM | POA: Diagnosis present

## 2023-12-16 DIAGNOSIS — I130 Hypertensive heart and chronic kidney disease with heart failure and stage 1 through stage 4 chronic kidney disease, or unspecified chronic kidney disease: Secondary | ICD-10-CM | POA: Diagnosis present

## 2023-12-16 DIAGNOSIS — N390 Urinary tract infection, site not specified: Secondary | ICD-10-CM | POA: Diagnosis present

## 2023-12-16 DIAGNOSIS — Z7984 Long term (current) use of oral hypoglycemic drugs: Secondary | ICD-10-CM

## 2023-12-16 DIAGNOSIS — R3912 Poor urinary stream: Secondary | ICD-10-CM | POA: Diagnosis present

## 2023-12-16 DIAGNOSIS — Z1152 Encounter for screening for COVID-19: Secondary | ICD-10-CM

## 2023-12-16 DIAGNOSIS — T501X6A Underdosing of loop [high-ceiling] diuretics, initial encounter: Secondary | ICD-10-CM | POA: Diagnosis present

## 2023-12-16 DIAGNOSIS — R3911 Hesitancy of micturition: Secondary | ICD-10-CM | POA: Diagnosis present

## 2023-12-16 DIAGNOSIS — K760 Fatty (change of) liver, not elsewhere classified: Secondary | ICD-10-CM | POA: Diagnosis present

## 2023-12-16 DIAGNOSIS — R531 Weakness: Secondary | ICD-10-CM | POA: Diagnosis present

## 2023-12-16 DIAGNOSIS — E871 Hypo-osmolality and hyponatremia: Secondary | ICD-10-CM | POA: Diagnosis present

## 2023-12-16 DIAGNOSIS — E869 Volume depletion, unspecified: Secondary | ICD-10-CM | POA: Diagnosis present

## 2023-12-16 DIAGNOSIS — Z794 Long term (current) use of insulin: Secondary | ICD-10-CM

## 2023-12-16 DIAGNOSIS — D509 Iron deficiency anemia, unspecified: Secondary | ICD-10-CM | POA: Diagnosis present

## 2023-12-16 DIAGNOSIS — Z880 Allergy status to penicillin: Secondary | ICD-10-CM

## 2023-12-16 DIAGNOSIS — Z801 Family history of malignant neoplasm of trachea, bronchus and lung: Secondary | ICD-10-CM

## 2023-12-16 DIAGNOSIS — E1165 Type 2 diabetes mellitus with hyperglycemia: Secondary | ICD-10-CM | POA: Diagnosis present

## 2023-12-16 DIAGNOSIS — R600 Localized edema: Secondary | ICD-10-CM | POA: Diagnosis present

## 2023-12-16 DIAGNOSIS — R300 Dysuria: Secondary | ICD-10-CM | POA: Diagnosis present

## 2023-12-16 DIAGNOSIS — N179 Acute kidney failure, unspecified: Secondary | ICD-10-CM

## 2023-12-16 DIAGNOSIS — J9601 Acute respiratory failure with hypoxia: Secondary | ICD-10-CM | POA: Diagnosis present

## 2023-12-16 DIAGNOSIS — G4733 Obstructive sleep apnea (adult) (pediatric): Secondary | ICD-10-CM | POA: Diagnosis present

## 2023-12-16 DIAGNOSIS — R262 Difficulty in walking, not elsewhere classified: Secondary | ICD-10-CM | POA: Diagnosis present

## 2023-12-16 DIAGNOSIS — K863 Pseudocyst of pancreas: Secondary | ICD-10-CM | POA: Diagnosis present

## 2023-12-16 DIAGNOSIS — Z7901 Long term (current) use of anticoagulants: Secondary | ICD-10-CM

## 2023-12-16 DIAGNOSIS — I5033 Acute on chronic diastolic (congestive) heart failure: Secondary | ICD-10-CM | POA: Diagnosis present

## 2023-12-16 DIAGNOSIS — W19XXXA Unspecified fall, initial encounter: Secondary | ICD-10-CM | POA: Diagnosis present

## 2023-12-16 DIAGNOSIS — R509 Fever, unspecified: Principal | ICD-10-CM

## 2023-12-16 LAB — PROTIME-INR
INR: 1.7 — ABNORMAL HIGH (ref 0.8–1.2)
Prothrombin Time: 20.1 s — ABNORMAL HIGH (ref 11.4–15.2)

## 2023-12-16 LAB — COMPREHENSIVE METABOLIC PANEL WITH GFR
ALT: 33 U/L (ref 0–44)
AST: 55 U/L — ABNORMAL HIGH (ref 15–41)
Albumin: 3.1 g/dL — ABNORMAL LOW (ref 3.5–5.0)
Alkaline Phosphatase: 43 U/L (ref 38–126)
Anion gap: 13 (ref 5–15)
BUN: 42 mg/dL — ABNORMAL HIGH (ref 8–23)
CO2: 21 mmol/L — ABNORMAL LOW (ref 22–32)
Calcium: 8.5 mg/dL — ABNORMAL LOW (ref 8.9–10.3)
Chloride: 99 mmol/L (ref 98–111)
Creatinine, Ser: 1.89 mg/dL — ABNORMAL HIGH (ref 0.61–1.24)
GFR, Estimated: 38 mL/min — ABNORMAL LOW (ref >60)
Glucose, Bld: 228 mg/dL — ABNORMAL HIGH (ref 70–99)
Potassium: 4.2 mmol/L (ref 3.5–5.1)
Sodium: 133 mmol/L — ABNORMAL LOW (ref 135–145)
Total Bilirubin: 0.8 mg/dL (ref 0.0–1.2)
Total Protein: 6.8 g/dL (ref 6.5–8.1)

## 2023-12-16 LAB — CBC WITH DIFFERENTIAL/PLATELET
Abs Immature Granulocytes: 0.03 10*3/uL (ref 0.00–0.07)
Basophils Absolute: 0 10*3/uL (ref 0.0–0.1)
Basophils Relative: 0 %
Eosinophils Absolute: 0 10*3/uL (ref 0.0–0.5)
Eosinophils Relative: 0 %
HCT: 34.4 % — ABNORMAL LOW (ref 39.0–52.0)
Hemoglobin: 11 g/dL — ABNORMAL LOW (ref 13.0–17.0)
Immature Granulocytes: 0 %
Lymphocytes Relative: 4 %
Lymphs Abs: 0.3 10*3/uL — ABNORMAL LOW (ref 0.7–4.0)
MCH: 25.1 pg — ABNORMAL LOW (ref 26.0–34.0)
MCHC: 32 g/dL (ref 30.0–36.0)
MCV: 78.5 fL — ABNORMAL LOW (ref 80.0–100.0)
Monocytes Absolute: 0.6 10*3/uL (ref 0.1–1.0)
Monocytes Relative: 8 %
Neutro Abs: 6.5 10*3/uL (ref 1.7–7.7)
Neutrophils Relative %: 88 %
Platelets: 145 10*3/uL — ABNORMAL LOW (ref 150–400)
RBC: 4.38 MIL/uL (ref 4.22–5.81)
RDW: 16.4 % — ABNORMAL HIGH (ref 11.5–15.5)
WBC: 7.4 10*3/uL (ref 4.0–10.5)
nRBC: 0 % (ref 0.0–0.2)

## 2023-12-16 LAB — RESP PANEL BY RT-PCR (RSV, FLU A&B, COVID)  RVPGX2
Influenza A by PCR: NEGATIVE
Influenza B by PCR: NEGATIVE
Resp Syncytial Virus by PCR: NEGATIVE
SARS Coronavirus 2 by RT PCR: NEGATIVE

## 2023-12-16 LAB — LACTIC ACID, PLASMA
Lactic Acid, Venous: 2 mmol/L (ref 0.5–1.9)
Lactic Acid, Venous: 1.4 mmol/L (ref 0.5–1.9)

## 2023-12-16 MED ORDER — SODIUM CHLORIDE 0.9 % IV SOLN
2.0000 g | Freq: Once | INTRAVENOUS | Status: AC
  Administered 2023-12-16: 2 g via INTRAVENOUS
  Filled 2023-12-16: qty 20

## 2023-12-16 MED ORDER — SODIUM CHLORIDE 0.9 % IV BOLUS
2000.0000 mL | Freq: Once | INTRAVENOUS | Status: AC
  Administered 2023-12-16: 2000 mL via INTRAVENOUS

## 2023-12-16 MED ORDER — ACETAMINOPHEN 500 MG PO TABS
1000.0000 mg | ORAL_TABLET | Freq: Once | ORAL | Status: AC
  Administered 2023-12-16: 1000 mg via ORAL
  Filled 2023-12-16: qty 2

## 2023-12-16 NOTE — Consult Note (Signed)
 CODE SEPSIS - PHARMACY COMMUNICATION  **Broad-spectrum antimicrobials should be administered within one hour of sepsis diagnosis**  Time Code Sepsis call or page was received: 2127  Antibiotics ordered: Ceftriaxone  Time of first antibiotic administration: 2150  Additional action taken by pharmacy: N/A  If necessary, name of provider/nurse contacted: N/A    Will M. Alva Jewels, PharmD Clinical Pharmacist 12/16/2023 10:05 PM

## 2023-12-16 NOTE — ED Provider Notes (Signed)
 [place] EMERGENCY DEPARTMENT AT [facility] Provider Note   CSN: [id number] Arrival date & time: 12/16/23  2101     History {Add pertinent medical, surgical, social history, OB history to HPI:1} Chief Complaint  Patient presents with   Weakness    Evan Berry is a 68 y.o. male.  Patient presents to the emergency department by ambulance from home for generalized weakness.  Patient reports that he has not been feeling well for 1 week.  He has been having chills, cold sweats.  Patient reports chest congestion and cough.  He fell yesterday could not get back up.  He is having trouble walking now because he is so off balance.  No nausea or vomiting but he has had diarrhea.  No associated abdominal pain.  No rectal bleeding.       Home Medications Prior to Admission medications   Medication Sig Start Date End Date Taking? Authorizing Provider  acetaminophen  (TYLENOL ) 650 MG CR tablet Take 1,950 mg by mouth at bedtime.    [provider]  Alpha-Lipoic Acid 300 MG CAPS Take 300 mg by mouth daily.    [provider]  amLODipine (NORVASC) 5 MG tablet Take 5 mg by mouth daily.    [provider]  apixaban  (ELIQUIS ) 5 MG TABS tablet Take 1 tablet (5 mg total) by mouth 2 (two) times daily. appt req for refill [id number] 01/31/22   Asa Lauth, NP  Cyanocobalamin (B-12) 2500 MCG TABS Take 2,500 mcg by mouth at bedtime.    [provider]  ezetimibe (ZETIA) 10 MG tablet Take 10 mg by mouth daily.    [provider]  glimepiride (AMARYL) 4 MG tablet Take 4 mg by mouth daily with breakfast.    [provider]  HYDROcodone -acetaminophen  (NORCO) 10-325 MG tablet Take 1 tablet by mouth every 6 (six) hours as needed for severe pain or moderate pain. 01/07/23   Molpus, John, MD  levothyroxine (SYNTHROID, LEVOTHROID) 150 MCG tablet Take 150-300 mcg by mouth See admin instructions.  Take 300 mg at bedtime on Sunday. All the other  days take 150 mg at bedtime    [provider]  metFORMIN (GLUCOPHAGE-XR) 500 MG 24 hr tablet Take 1,000 mg by mouth 2 (two) times daily with a meal. 10/12/17   [provider]  metoprolol  tartrate (LOPRESSOR ) 25 MG tablet Take 25 mg by mouth 2 (two) times daily.    [provider]  Multiple Vitamin (MULTIVITAMIN) capsule Take 1 capsule by mouth at bedtime.    [provider]  Omega-3 Fatty Acids (FISH OIL) 1000 MG CAPS Take 1,000 mg by mouth in the morning.    [provider]  omeprazole  (PRILOSEC) 20 MG capsule TAKE 1 CAPSULE (20 MG TOTAL) BY MOUTH 2 (TWO) TIMES DAILY BEFORE A MEAL. 12/25/18   Asencion Blacksmith, MD  [facility] VERIO test strip  10/14/19   [provider]  ramipril (ALTACE) 10 MG capsule Take 10 mg by mouth 2 (two) times daily. 10/03/19   [provider]  rosuvastatin (CRESTOR) 20 MG tablet Take 20 mg by mouth daily. 07/05/22   [provider]  triamcinolone (NASACORT ALLERGY 24HR) 55 MCG/ACT AERO nasal inhaler Place 2 sprays into the nose in the morning.    [provider]  zinc gluconate 50 MG tablet Take 50 mg by mouth 2 (two) times a week.    [provider]      Allergies    Penicillins  Review of Systems   Review of Systems  Physical Exam Updated Vital Signs BP (!) 115/57   Pulse (!) 106   Temp (!) 103.1 F (39.5 C) (Oral)   Resp (!) 28   Ht 6' (1.829 m)   Wt (!) 178.7 kg   SpO2 96%   BMI 53.43 kg/m  Physical Exam Vitals and nursing note reviewed.  Constitutional:      General: He is not in acute distress.    Appearance: He is well-developed. He is obese.  HENT:     Head: Normocephalic and atraumatic.     Mouth/Throat:     Mouth: Mucous membranes are moist.  Eyes:     General: Vision grossly intact. Gaze aligned appropriately.     Extraocular Movements: Extraocular movements intact.     Conjunctiva/sclera: Conjunctivae normal.  Cardiovascular:     Rate and Rhythm:  Tachycardia present. Rhythm irregular.     Pulses: Normal pulses.     Heart sounds: Normal heart sounds, S1 normal and S2 normal. No murmur heard.    No friction rub. No gallop.  Pulmonary:     Effort: Pulmonary effort is normal. No respiratory distress.     Breath sounds: Decreased breath sounds and wheezing present.  Abdominal:     Palpations: Abdomen is soft.     Tenderness: There is no abdominal tenderness. There is no guarding or rebound.     Hernia: No hernia is present.  Musculoskeletal:        General: No swelling.     Cervical back: Full passive range of motion without pain, normal range of motion and neck supple. No pain with movement, spinous process tenderness or muscular tenderness. Normal range of motion.     Right lower leg: No edema.     Left lower leg: No edema.  Skin:    General: Skin is warm and dry.     Capillary Refill: Capillary refill takes less than 2 seconds.     Findings: No ecchymosis, erythema, lesion or wound.  Neurological:     Mental Status: He is alert and oriented to person, place, and time.     GCS: GCS eye subscore is 4. GCS verbal subscore is 5. GCS motor subscore is 6.     Cranial Nerves: Cranial nerves 2-12 are intact.     Sensory: Sensation is intact.     Motor: Motor function is intact. No weakness or abnormal muscle tone.     Coordination: Coordination is intact.  Psychiatric:        Mood and Affect: Mood normal.        Speech: Speech normal.        Behavior: Behavior normal.     ED Results / Procedures / Treatments   Labs (all labs ordered are listed, but only abnormal results are displayed) Labs Reviewed  LACTIC ACID, PLASMA - Abnormal; Notable for the following components:      Result Value   Lactic Acid, Venous 2.0 (*)    All other components within normal limits  COMPREHENSIVE METABOLIC PANEL WITH GFR - Abnormal; Notable for the following components:   Sodium 133 (*)    CO2 21 (*)    Glucose, Bld 228 (*)    BUN 42 (*)     Creatinine, Ser 1.89 (*)    Calcium 8.5 (*)    Albumin 3.1 (*)    AST 55 (*)    GFR, Estimated 38 (*)    All other components within normal  limits  CBC WITH DIFFERENTIAL/PLATELET - Abnormal; Notable for the following components:   Hemoglobin 11.0 (*)    HCT 34.4 (*)    MCV 78.5 (*)    MCH 25.1 (*)    RDW 16.4 (*)    Platelets 145 (*)    Lymphs Abs 0.3 (*)    All other components within normal limits  PROTIME-INR - Abnormal; Notable for the following components:   Prothrombin Time 20.1 (*)    INR 1.7 (*)    All other components within normal limits  RESP PANEL BY RT-PCR (RSV, FLU A&B, COVID)  [id number]  CULTURE, BLOOD (ROUTINE X 2)  CULTURE, BLOOD (ROUTINE X 2)  LACTIC ACID, PLASMA  URINALYSIS, W/ REFLEX TO CULTURE (INFECTION SUSPECTED)    EKG None  Radiology DG Chest Port 1 View Result Date: 12/16/2023 EXAM: 1 VIEW XRAY OF THE CHEST 12/16/2023 09:39:00 PM COMPARISON: 01/06/2023 CLINICAL HISTORY: Questionable sepsis - evaluate for abnormality. Patient from home BIB [facility] w/ complaints of weakness since Tuesday which he noticed after he started to come down w/ cold symptoms- reports chills, cold sweats, intermittent difficulty breathing, congestion, cough. Denies sick contacts. Usually is able to ambulate at home, but has been unable to. EMS reports patient was hypoxic on RA on arrival at 90% and placed patient on 3 L of O2 w/ improvement. Patient w/ hx of Afib and HR 80-110 for EMS. Patient febrile 103.1. hx of diabetes, hypertension. Former smoker. FINDINGS: LUNGS AND PLEURA: No focal pulmonary opacity. No pulmonary edema. No pleural effusion. No pneumothorax. HEART AND MEDIASTINUM: The heart is top normal in size. No acute abnormality of the mediastinal silhouette. BONES AND SOFT TISSUES: No acute osseous abnormality. IMPRESSION: 1. No acute findings. Electronically signed by: Zadie Herter MD 12/16/2023 09:43 PM EDT RP Workstation: [id number]    Procedures Procedures  {Document  cardiac monitor, telemetry assessment procedure when appropriate:1}  Medications Ordered in ED Medications  cefTRIAXone (ROCEPHIN) 2 g in sodium chloride  0.9 % 100 mL IVPB (0 g Intravenous Stopped 12/16/23 2247)  sodium chloride  0.9 % bolus 2,000 mL (2,000 mLs Intravenous New Bag/Given 12/16/23 2149)  acetaminophen  (TYLENOL ) tablet 1,000 mg (1,000 mg Oral Given 12/16/23 2148)    ED Course/ Medical Decision Making/ A&P   {   Click here for ABCD2, HEART and other calculatorsREFRESH Note before signing :1}                              Medical Decision Making  ***  {Document critical care time when appropriate:1} {Document review of labs and clinical decision tools ie heart score, Chads2Vasc2 etc:1}  {Document your independent review of radiology images, and any outside records:1} {Document your discussion with family members, caretakers, and with consultants:1} {Document social determinants of health affecting pt's care:1} {Document your decision making why or why not admission, treatments were needed:1} Final Clinical Impression(s) / ED Diagnoses Final diagnoses:  None    Rx / DC Orders ED Discharge Orders     None

## 2023-12-16 NOTE — Sepsis Progress Note (Signed)
 Elink following code sepsis

## 2023-12-16 NOTE — ED Triage Notes (Signed)
 Patient from home BIB [facility] w/ complaints of weakness since Tuesday which he noticed after he started to come down w/ cold symptoms- reports chills, cold sweats, intermittent difficulty breathing, congestion, cough. Denies sick contacts. Usually is able to ambulate at home, but has been unable to.EMS reports patient was hypoxic on RA on arrival at 90% and placed patient on 3 L of O2 w/ improvement. Patient w/ hx of Afib and HR 80-110 for EMS. Patient febrile 103.1.

## 2023-12-17 ENCOUNTER — Inpatient Hospital Stay (HOSPITAL_COMMUNITY)

## 2023-12-17 DIAGNOSIS — N1832 Chronic kidney disease, stage 3b: Secondary | ICD-10-CM | POA: Diagnosis present

## 2023-12-17 DIAGNOSIS — A419 Sepsis, unspecified organism: Principal | ICD-10-CM

## 2023-12-17 DIAGNOSIS — R6510 Systemic inflammatory response syndrome (SIRS) of non-infectious origin without acute organ dysfunction: Secondary | ICD-10-CM | POA: Diagnosis present

## 2023-12-17 DIAGNOSIS — I5032 Chronic diastolic (congestive) heart failure: Secondary | ICD-10-CM | POA: Diagnosis not present

## 2023-12-17 DIAGNOSIS — N390 Urinary tract infection, site not specified: Secondary | ICD-10-CM | POA: Diagnosis present

## 2023-12-17 DIAGNOSIS — N170 Acute kidney failure with tubular necrosis: Secondary | ICD-10-CM | POA: Diagnosis present

## 2023-12-17 DIAGNOSIS — I5033 Acute on chronic diastolic (congestive) heart failure: Secondary | ICD-10-CM | POA: Diagnosis present

## 2023-12-17 DIAGNOSIS — I5031 Acute diastolic (congestive) heart failure: Secondary | ICD-10-CM

## 2023-12-17 DIAGNOSIS — D509 Iron deficiency anemia, unspecified: Secondary | ICD-10-CM | POA: Diagnosis present

## 2023-12-17 DIAGNOSIS — E66813 Obesity, class 3: Secondary | ICD-10-CM

## 2023-12-17 DIAGNOSIS — W19XXXA Unspecified fall, initial encounter: Secondary | ICD-10-CM | POA: Diagnosis present

## 2023-12-17 DIAGNOSIS — J9601 Acute respiratory failure with hypoxia: Secondary | ICD-10-CM | POA: Diagnosis present

## 2023-12-17 DIAGNOSIS — E1122 Type 2 diabetes mellitus with diabetic chronic kidney disease: Secondary | ICD-10-CM | POA: Diagnosis present

## 2023-12-17 DIAGNOSIS — I130 Hypertensive heart and chronic kidney disease with heart failure and stage 1 through stage 4 chronic kidney disease, or unspecified chronic kidney disease: Secondary | ICD-10-CM | POA: Diagnosis present

## 2023-12-17 DIAGNOSIS — E871 Hypo-osmolality and hyponatremia: Secondary | ICD-10-CM | POA: Diagnosis present

## 2023-12-17 DIAGNOSIS — Z1152 Encounter for screening for COVID-19: Secondary | ICD-10-CM | POA: Diagnosis not present

## 2023-12-17 DIAGNOSIS — J181 Lobar pneumonia, unspecified organism: Secondary | ICD-10-CM | POA: Diagnosis not present

## 2023-12-17 DIAGNOSIS — E1165 Type 2 diabetes mellitus with hyperglycemia: Secondary | ICD-10-CM | POA: Diagnosis present

## 2023-12-17 DIAGNOSIS — K863 Pseudocyst of pancreas: Secondary | ICD-10-CM | POA: Diagnosis present

## 2023-12-17 DIAGNOSIS — Z6841 Body Mass Index (BMI) 40.0 and over, adult: Secondary | ICD-10-CM | POA: Diagnosis not present

## 2023-12-17 DIAGNOSIS — Z794 Long term (current) use of insulin: Secondary | ICD-10-CM | POA: Diagnosis not present

## 2023-12-17 DIAGNOSIS — E869 Volume depletion, unspecified: Secondary | ICD-10-CM | POA: Diagnosis present

## 2023-12-17 DIAGNOSIS — I4811 Longstanding persistent atrial fibrillation: Secondary | ICD-10-CM | POA: Diagnosis present

## 2023-12-17 DIAGNOSIS — N12 Tubulo-interstitial nephritis, not specified as acute or chronic: Secondary | ICD-10-CM | POA: Diagnosis present

## 2023-12-17 DIAGNOSIS — R6520 Severe sepsis without septic shock: Secondary | ICD-10-CM | POA: Diagnosis present

## 2023-12-17 DIAGNOSIS — I4819 Other persistent atrial fibrillation: Secondary | ICD-10-CM | POA: Diagnosis present

## 2023-12-17 DIAGNOSIS — E039 Hypothyroidism, unspecified: Secondary | ICD-10-CM | POA: Diagnosis present

## 2023-12-17 DIAGNOSIS — K760 Fatty (change of) liver, not elsewhere classified: Secondary | ICD-10-CM | POA: Diagnosis present

## 2023-12-17 DIAGNOSIS — N179 Acute kidney failure, unspecified: Secondary | ICD-10-CM | POA: Diagnosis not present

## 2023-12-17 LAB — BLOOD GAS, VENOUS
Acid-base deficit: 2.9 mmol/L — ABNORMAL HIGH (ref 0.0–2.0)
Bicarbonate: 22 mmol/L (ref 20.0–28.0)
Drawn by: 8368
O2 Saturation: 60.7 %
Patient temperature: 37.5
pCO2, Ven: 39 mmHg — ABNORMAL LOW (ref 44–60)
pH, Ven: 7.36 (ref 7.25–7.43)
pO2, Ven: 36 mmHg (ref 32–45)

## 2023-12-17 LAB — URINALYSIS, W/ REFLEX TO CULTURE (INFECTION SUSPECTED)
Bacteria, UA: NONE SEEN
Bilirubin Urine: NEGATIVE
Glucose, UA: 50 mg/dL — AB
Ketones, ur: NEGATIVE mg/dL
Nitrite: NEGATIVE
Protein, ur: 100 mg/dL — AB
Specific Gravity, Urine: 1.021 (ref 1.005–1.030)
WBC, UA: >50 WBC/hpf (ref 0–5)
pH: 5 (ref 5.0–8.0)

## 2023-12-17 LAB — RESPIRATORY PANEL BY PCR

## 2023-12-17 LAB — COMPREHENSIVE METABOLIC PANEL WITH GFR
ALT: 34 U/L (ref 0–44)
AST: 56 U/L — ABNORMAL HIGH (ref 15–41)
Albumin: 2.9 g/dL — ABNORMAL LOW (ref 3.5–5.0)
Alkaline Phosphatase: 39 U/L (ref 38–126)
Anion gap: 12 (ref 5–15)
BUN: 42 mg/dL — ABNORMAL HIGH (ref 8–23)
CO2: 21 mmol/L — ABNORMAL LOW (ref 22–32)
Calcium: 8 mg/dL — ABNORMAL LOW (ref 8.9–10.3)
Chloride: 100 mmol/L (ref 98–111)
Creatinine, Ser: 1.92 mg/dL — ABNORMAL HIGH (ref 0.61–1.24)
GFR, Estimated: 37 mL/min — ABNORMAL LOW (ref >60)
Glucose, Bld: 173 mg/dL — ABNORMAL HIGH (ref 70–99)
Potassium: 3.8 mmol/L (ref 3.5–5.1)
Sodium: 133 mmol/L — ABNORMAL LOW (ref 135–145)
Total Bilirubin: 0.7 mg/dL (ref 0.0–1.2)
Total Protein: 6.4 g/dL — ABNORMAL LOW (ref 6.5–8.1)

## 2023-12-17 LAB — ECHOCARDIOGRAM COMPLETE
AR max vel: 1.98 cm2
AV Area VTI: 2.06 cm2
AV Area mean vel: 2.11 cm2
AV Mean grad: 7 mmHg
AV Peak grad: 14 mmHg
Ao pk vel: 1.87 m/s
Area-P 1/2: 4.06 cm2
Height: 72 in
S' Lateral: 3.1 cm
Weight: 6232.85 [oz_av]

## 2023-12-17 LAB — MAGNESIUM: Magnesium: 1.8 mg/dL (ref 1.7–2.4)

## 2023-12-17 LAB — HIV ANTIBODY (ROUTINE TESTING W REFLEX): HIV Screen 4th Generation wRfx: NONREACTIVE

## 2023-12-17 LAB — CBC
HCT: 31.9 % — ABNORMAL LOW (ref 39.0–52.0)
Hemoglobin: 9.8 g/dL — ABNORMAL LOW (ref 13.0–17.0)
MCH: 24.3 pg — ABNORMAL LOW (ref 26.0–34.0)
MCHC: 30.7 g/dL (ref 30.0–36.0)
MCV: 79 fL — ABNORMAL LOW (ref 80.0–100.0)
Platelets: 137 10*3/uL — ABNORMAL LOW (ref 150–400)
RBC: 4.04 MIL/uL — ABNORMAL LOW (ref 4.22–5.81)
RDW: 16.5 % — ABNORMAL HIGH (ref 11.5–15.5)
WBC: 7 10*3/uL (ref 4.0–10.5)
nRBC: 0 % (ref 0.0–0.2)

## 2023-12-17 LAB — HEMOGLOBIN A1C
Hgb A1c MFr Bld: 9.1 % — ABNORMAL HIGH (ref 4.8–5.6)
Mean Plasma Glucose: 214.47 mg/dL

## 2023-12-17 LAB — GLUCOSE, CAPILLARY
Glucose-Capillary: 217 mg/dL — ABNORMAL HIGH (ref 70–99)
Glucose-Capillary: 209 mg/dL — ABNORMAL HIGH (ref 70–99)
Glucose-Capillary: 216 mg/dL — ABNORMAL HIGH (ref 70–99)

## 2023-12-17 LAB — PROCALCITONIN: Procalcitonin: 8.6 ng/mL

## 2023-12-17 LAB — PHOSPHORUS: Phosphorus: 3 mg/dL (ref 2.5–4.6)

## 2023-12-17 LAB — BRAIN NATRIURETIC PEPTIDE: B Natriuretic Peptide: 75 pg/mL (ref 0.0–100.0)

## 2023-12-17 MED ORDER — SODIUM CHLORIDE 0.9 % IV SOLN
2.0000 g | INTRAVENOUS | Status: DC
  Administered 2023-12-17 – 2023-12-21 (×5): 2 g via INTRAVENOUS
  Filled 2023-12-17 (×5): qty 20

## 2023-12-17 MED ORDER — VITAMIN B-12 1000 MCG PO TABS
2500.0000 ug | ORAL_TABLET | Freq: Every day | ORAL | Status: DC
  Administered 2023-12-17 – 2023-12-21 (×5): 2500 ug via ORAL
  Filled 2023-12-17 (×5): qty 3

## 2023-12-17 MED ORDER — ACETAMINOPHEN 650 MG RE SUPP: 650.0000 mg | Freq: Four times a day (QID) | RECTAL | Status: DC | PRN

## 2023-12-17 MED ORDER — LEVOTHYROXINE SODIUM 75 MCG PO TABS
150.0000 ug | ORAL_TABLET | ORAL | Status: DC
  Administered 2023-12-18 – 2023-12-22 (×5): 150 ug via ORAL
  Filled 2023-12-17 (×5): qty 2

## 2023-12-17 MED ORDER — ONDANSETRON HCL 4 MG PO TABS: 4.0000 mg | ORAL_TABLET | Freq: Four times a day (QID) | ORAL | Status: DC | PRN

## 2023-12-17 MED ORDER — LEVALBUTEROL HCL 0.63 MG/3ML IN NEBU
0.6300 mg | INHALATION_SOLUTION | Freq: Four times a day (QID) | RESPIRATORY_TRACT | Status: DC
  Administered 2023-12-17 (×2): 0.63 mg via RESPIRATORY_TRACT
  Filled 2023-12-17: qty 3

## 2023-12-17 MED ORDER — SODIUM CHLORIDE 0.9 % IV SOLN: 250.0000 mL | INTRAVENOUS | Status: AC | PRN

## 2023-12-17 MED ORDER — SODIUM CHLORIDE 0.9 % IV SOLN
500.0000 mg | INTRAVENOUS | Status: DC
  Administered 2023-12-17 – 2023-12-21 (×5): 500 mg via INTRAVENOUS
  Filled 2023-12-17 (×5): qty 5

## 2023-12-17 MED ORDER — PANTOPRAZOLE SODIUM 40 MG PO TBEC
40.0000 mg | DELAYED_RELEASE_TABLET | Freq: Every day | ORAL | Status: DC
  Administered 2023-12-17 – 2023-12-22 (×6): 40 mg via ORAL
  Filled 2023-12-17 (×6): qty 1

## 2023-12-17 MED ORDER — ACETAMINOPHEN 325 MG PO TABS
650.0000 mg | ORAL_TABLET | Freq: Once | ORAL | Status: AC | PRN
  Administered 2023-12-17: 650 mg via ORAL
  Filled 2023-12-17: qty 2

## 2023-12-17 MED ORDER — ENOXAPARIN SODIUM 40 MG/0.4ML IJ SOSY: 40.0000 mg | PREFILLED_SYRINGE | INTRAMUSCULAR | Status: DC

## 2023-12-17 MED ORDER — APIXABAN 5 MG PO TABS: 5.0000 mg | ORAL_TABLET | Freq: Two times a day (BID) | ORAL | Status: DC

## 2023-12-17 MED ORDER — ACETAMINOPHEN 325 MG PO TABS
650.0000 mg | ORAL_TABLET | ORAL | Status: DC | PRN
  Administered 2023-12-18: 650 mg via ORAL
  Filled 2023-12-17: qty 2

## 2023-12-17 MED ORDER — LEVOTHYROXINE SODIUM 100 MCG PO TABS
300.0000 ug | ORAL_TABLET | ORAL | Status: DC
  Administered 2023-12-17: 300 ug via ORAL
  Filled 2023-12-17 (×2): qty 3

## 2023-12-17 MED ORDER — LEVALBUTEROL HCL 0.63 MG/3ML IN NEBU: 0.6300 mg | INHALATION_SOLUTION | Freq: Three times a day (TID) | RESPIRATORY_TRACT | Status: DC

## 2023-12-17 MED ORDER — INSULIN ASPART 100 UNIT/ML IJ SOLN
0.0000 [IU] | Freq: Three times a day (TID) | INTRAMUSCULAR | Status: DC
  Administered 2023-12-17 (×2): 3 [IU] via SUBCUTANEOUS
  Administered 2023-12-18 (×2): 2 [IU] via SUBCUTANEOUS
  Administered 2023-12-18 – 2023-12-19 (×2): 3 [IU] via SUBCUTANEOUS
  Administered 2023-12-19: 2 [IU] via SUBCUTANEOUS
  Administered 2023-12-19: 3 [IU] via SUBCUTANEOUS
  Administered 2023-12-20 (×2): 5 [IU] via SUBCUTANEOUS
  Administered 2023-12-20: 2 [IU] via SUBCUTANEOUS
  Administered 2023-12-21 (×2): 7 [IU] via SUBCUTANEOUS
  Administered 2023-12-21: 3 [IU] via SUBCUTANEOUS
  Administered 2023-12-22: 5 [IU] via SUBCUTANEOUS

## 2023-12-17 MED ORDER — POTASSIUM CHLORIDE CRYS ER 20 MEQ PO TBCR
20.0000 meq | EXTENDED_RELEASE_TABLET | Freq: Every day | ORAL | Status: DC
  Administered 2023-12-17 – 2023-12-22 (×6): 20 meq via ORAL
  Filled 2023-12-17 (×6): qty 1

## 2023-12-17 MED ORDER — METOPROLOL TARTRATE 5 MG/5ML IV SOLN
2.5000 mg | Freq: Four times a day (QID) | INTRAVENOUS | Status: DC
  Administered 2023-12-17: 2.5 mg via INTRAVENOUS
  Filled 2023-12-17 (×2): qty 5

## 2023-12-17 MED ORDER — METOPROLOL TARTRATE 5 MG/5ML IV SOLN
2.5000 mg | Freq: Four times a day (QID) | INTRAVENOUS | Status: DC
  Administered 2023-12-17 – 2023-12-18 (×4): 2.5 mg via INTRAVENOUS
  Filled 2023-12-17 (×3): qty 5

## 2023-12-17 MED ORDER — APIXABAN 5 MG PO TABS
5.0000 mg | ORAL_TABLET | Freq: Two times a day (BID) | ORAL | Status: DC
  Administered 2023-12-17 – 2023-12-22 (×11): 5 mg via ORAL
  Filled 2023-12-17 (×11): qty 1

## 2023-12-17 MED ORDER — ACETAMINOPHEN 325 MG PO TABS
650.0000 mg | ORAL_TABLET | Freq: Four times a day (QID) | ORAL | Status: DC | PRN
  Administered 2023-12-17: 650 mg via ORAL
  Filled 2023-12-17 (×2): qty 2

## 2023-12-17 MED ORDER — PERFLUTREN LIPID MICROSPHERE
1.0000 mL | INTRAVENOUS | Status: AC | PRN
  Administered 2023-12-17: 1 mL via INTRAVENOUS

## 2023-12-17 MED ORDER — LACTATED RINGERS IV BOLUS
1000.0000 mL | Freq: Once | INTRAVENOUS | Status: AC
  Administered 2023-12-17: 1000 mL via INTRAVENOUS

## 2023-12-17 MED ORDER — FUROSEMIDE 10 MG/ML IJ SOLN
40.0000 mg | Freq: Two times a day (BID) | INTRAMUSCULAR | Status: DC
  Administered 2023-12-17 – 2023-12-18 (×3): 40 mg via INTRAVENOUS
  Filled 2023-12-17 (×3): qty 4

## 2023-12-17 MED ORDER — SODIUM CHLORIDE 0.9% FLUSH: 3.0000 mL | INTRAVENOUS | Status: DC | PRN

## 2023-12-17 MED ORDER — ONDANSETRON HCL 4 MG/2ML IJ SOLN: 4.0000 mg | Freq: Four times a day (QID) | INTRAMUSCULAR | Status: DC | PRN

## 2023-12-17 MED ORDER — EZETIMIBE 10 MG PO TABS
10.0000 mg | ORAL_TABLET | Freq: Every day | ORAL | Status: DC
  Administered 2023-12-17 – 2023-12-22 (×6): 10 mg via ORAL
  Filled 2023-12-17 (×6): qty 1

## 2023-12-17 MED ORDER — LEVOTHYROXINE SODIUM 75 MCG PO TABS: 150.0000 ug | ORAL_TABLET | ORAL | Status: DC

## 2023-12-17 MED ORDER — ROSUVASTATIN CALCIUM 20 MG PO TABS
20.0000 mg | ORAL_TABLET | Freq: Every day | ORAL | Status: DC
  Administered 2023-12-17 – 2023-12-22 (×6): 20 mg via ORAL
  Filled 2023-12-17 (×6): qty 1

## 2023-12-17 MED ORDER — LEVALBUTEROL HCL 0.63 MG/3ML IN NEBU
0.6300 mg | INHALATION_SOLUTION | Freq: Four times a day (QID) | RESPIRATORY_TRACT | Status: DC | PRN
  Administered 2023-12-17 – 2023-12-18 (×3): 0.63 mg via RESPIRATORY_TRACT
  Filled 2023-12-17 (×3): qty 3

## 2023-12-17 MED ORDER — SODIUM CHLORIDE 0.9% FLUSH
3.0000 mL | Freq: Two times a day (BID) | INTRAVENOUS | Status: DC
  Administered 2023-12-17 – 2023-12-22 (×11): 3 mL via INTRAVENOUS

## 2023-12-17 NOTE — ED Notes (Signed)
 Delay in taking patient to the floor due to change of patient's vital signs, however v/s improved and patient now fine to be taken to the floor.

## 2023-12-17 NOTE — Hospital Course (Signed)
 68 year old with a history of persistent atrial fibrillation, hypertension, diabetes mellitus type 2, OSA on CPAP, hepatic steatosis, pancreatic pseudocyst, hypothyroidism, and GERD presenting with generalized weakness and shortness of breath of 1 week duration.  The patient has had some subjective fevers and chills.  He denied any chest pain, coughing, hemoptysis, abdominal pain, nausea, vomiting.  He did have 1 episode of loose stool without any hematochezia or melena.  He has had dysuria for the past 2 weeks.  He has had lower extremity edema for the better part of the last 6 months.  He quit taking his furosemide 6 months ago because he felt like it was not working. In the ED, the patient was febrile up to 103.1 F.  He was tachycardic in 120-130.  He was hemodynamically stable.  Oxygen saturation was 90% on room air.  He was placed on 2 L with saturation up to 95%.  Lactic acid 2.0>> 1.4. The patient was started on ceftriaxone.  He was given 3 L of fluid.

## 2023-12-17 NOTE — H&P (Signed)
 History and Physical    Patient: Evan Berry ZOX:[id number] DOB: 07-15-1956 DOA: 12/16/2023 DOS: the patient was seen and examined on 12/17/2023 PCP: Aldo Hun, MD  Patient coming from: Home  Chief Complaint:  Chief Complaint  Patient presents with   Weakness   HPI: Evan Berry is a 68 year old with a history of persistent atrial fibrillation, hypertension, diabetes mellitus type 2, OSA on CPAP, hepatic steatosis, pancreatic pseudocyst, hypothyroidism, and GERD presenting with generalized weakness and shortness of breath of 1 week duration.  The patient has had some subjective fevers and chills.  He denied any chest pain, coughing, hemoptysis, abdominal pain, nausea, vomiting.  He did have 1 episode of loose stool without any hematochezia or melena.  He has had dysuria for the past 2 weeks.  He has had lower extremity edema for the better part of the last 6 months.  He quit taking his furosemide 6 months ago because he felt like it was not working. In the ED, the patient was febrile up to 1 to 3.1 F.  He was tachycardic in 120-130.  He was hemodynamically stable.  Oxygen saturation was 90% on room air.  He was placed on 2 L with saturation up to 95%.  Lactic acid 2.0>> 1.4. The patient was started on ceftriaxone.  He was given 3 L of fluid.  Review of Systems: As mentioned in the history of present illness. All other systems reviewed and are negative. Past Medical History:  Diagnosis Date   Allergic rhinitis    Arthritis    Blood transfusion without reported diagnosis    Cataract    Chronic kidney disease    pt. denies   Diabetes (HCC)    type 2   Dyspnea on exertion    GERD (gastroesophageal reflux disease)    History of hiatal hernia    Hyperlipidemia    Hypertension    Hypothyroidism    Iron deficiency anemia    OSA (obstructive sleep apnea)    PUD (peptic ulcer disease)    Sleep apnea    WEARS CPAP   Past Surgical History:  Procedure Laterality Date   BACK  SURGERY  11/2008   x2   bleeding ulcer surgery     CARDIOVERSION N/A 01/29/2020   Procedure: CARDIOVERSION;  Surgeon: Luana Rumple, MD;  Location: [facility];  Service: Cardiovascular;  Laterality: N/A;   CHOLECYSTECTOMY  1998   COLONOSCOPY     COLONOSCOPY WITH PROPOFOL  N/A 09/26/2022   Procedure: COLONOSCOPY WITH PROPOFOL ;  Surgeon: Asencion Blacksmith, MD;  Location: [facility];  Service: Gastroenterology;  Laterality: N/A;   ESOPHAGOGASTRODUODENOSCOPY (EGD) WITH PROPOFOL  N/A 12/18/2017   Procedure: ESOPHAGOGASTRODUODENOSCOPY (EGD) WITH PROPOFOL ;  Surgeon: Asencion Blacksmith, MD;  Location: [facility];  Service: Endoscopy;  Laterality: N/A;   ESOPHAGOGASTRODUODENOSCOPY (EGD) WITH PROPOFOL  N/A 09/26/2022   Procedure: ESOPHAGOGASTRODUODENOSCOPY (EGD) WITH PROPOFOL ;  Surgeon: Asencion Blacksmith, MD;  Location: [facility];  Service: Gastroenterology;  Laterality: N/A;   GASTRIC BYPASS  1983   GASTRIC RESTRICTION SURGERY     POLYPECTOMY  12/18/2017   Procedure: POLYPECTOMY;  Surgeon: Asencion Blacksmith, MD;  Location: [facility];  Service: Endoscopy;;   POLYPECTOMY  09/26/2022   Procedure: POLYPECTOMY;  Surgeon: Asencion Blacksmith, MD;  Location: [facility];  Service: Gastroenterology;;   Social History:  reports that he quit smoking about 43 years ago. His smoking use included cigarettes. He started smoking about 44 years ago. He has a 1 pack-year smoking history. He has  never used smokeless tobacco. He reports that he does not drink alcohol  and does not use drugs.  Allergies  Allergen Reactions   Penicillins     Can't remember reaction, childhood reaction  Has patient had a PCN reaction causing immediate rash, facial/tongue/throat swelling, SOB or lightheadedness with hypotension: No Has patient had a PCN reaction causing severe rash involving mucus membranes or skin necrosis: No Has patient had a PCN reaction that required hospitalization: No Has patient had a PCN reaction occurring within  the last 10 years: No If all of the above answers are "NO", then may proceed with Cephalosporin use.     Family History  Problem Relation Age of Onset   Heart disease Maternal Grandmother    Heart disease Mother    Cancer Paternal Grandmother        unsure what kind   Lung cancer Father    Colon cancer Neg Hx    Esophageal cancer Neg Hx    Rectal cancer Neg Hx    Stomach cancer Neg Hx     Prior to Admission medications   Medication Sig Start Date End Date Taking? Authorizing Provider  acetaminophen  (TYLENOL ) 650 MG CR tablet Take 1,950 mg by mouth at bedtime.    [provider]  Alpha-Lipoic Acid 300 MG CAPS Take 300 mg by mouth daily.    [provider]  amLODipine (NORVASC) 5 MG tablet Take 5 mg by mouth daily.    [provider]  apixaban  (ELIQUIS ) 5 MG TABS tablet Take 1 tablet (5 mg total) by mouth 2 (two) times daily. appt req for refill [id number] 01/31/22   Asa Lauth, NP  Cyanocobalamin (B-12) 2500 MCG TABS Take 2,500 mcg by mouth at bedtime.    [provider]  ezetimibe (ZETIA) 10 MG tablet Take 10 mg by mouth daily.    [provider]  glimepiride (AMARYL) 4 MG tablet Take 4 mg by mouth daily with breakfast.    [provider]  HYDROcodone -acetaminophen  (NORCO) 10-325 MG tablet Take 1 tablet by mouth every 6 (six) hours as needed for severe pain or moderate pain. 01/07/23   Molpus, John, MD  levothyroxine (SYNTHROID, LEVOTHROID) 150 MCG tablet Take 150-300 mcg by mouth See admin instructions.  Take 300 mg at bedtime on Sunday. All the other days take 150 mg at bedtime    [provider]  metFORMIN (GLUCOPHAGE-XR) 500 MG 24 hr tablet Take 1,000 mg by mouth 2 (two) times daily with a meal. 10/12/17   [provider]  metoprolol  tartrate (LOPRESSOR ) 25 MG tablet Take 25 mg by mouth 2 (two) times daily.    [provider]  Multiple Vitamin (MULTIVITAMIN) capsule Take 1 capsule by mouth at  bedtime.    [provider]  Omega-3 Fatty Acids (FISH OIL) 1000 MG CAPS Take 1,000 mg by mouth in the morning.    [provider]  omeprazole  (PRILOSEC) 20 MG capsule TAKE 1 CAPSULE (20 MG TOTAL) BY MOUTH 2 (TWO) TIMES DAILY BEFORE A MEAL. 12/25/18   Asencion Blacksmith, MD  [facility] VERIO test strip  10/14/19   [provider]  ramipril (ALTACE) 10 MG capsule Take 10 mg by mouth 2 (two) times daily. 10/03/19   [provider]  rosuvastatin (CRESTOR) 20 MG tablet Take 20 mg by mouth daily. 07/05/22   [provider]  triamcinolone (NASACORT ALLERGY 24HR) 55 MCG/ACT AERO nasal inhaler Place 2 sprays into the nose in the morning.  [provider]  zinc gluconate 50 MG tablet Take 50 mg by mouth 2 (two) times a week.    [provider]    Physical Exam: Vitals:   12/17/23 0604 12/17/23 0615 12/17/23 0630 12/17/23 0653  BP:  110/66 108/62 136/71  Pulse:  (!) 111 (!) 111 (!) 121  Resp:  (!) 24 (!) 31 14  Temp: 100 F (37.8 C)   99.5 F (37.5 C)  TempSrc: Oral   Oral  SpO2:  93% 91% 95%  Weight:      Height:       GENERAL:  A&O x 3, NAD, well developed, cooperative, follows commands HEENT: [place]/AT, No thrush, No icterus, No oral ulcers Neck:  No neck mass, No meningismus, soft, supple CV: RRR, no S3, no S4, no rub, no JVD Lungs:  bibasilar  crackles.  No wheeze Abd: soft/NT +BS, nondistended Ext: 2 + LE edema, no lymphangitis, no cyanosis, no rashes Neuro:  CN II-XII intact, strength 4/5 in RUE, RLE, strength 4/5 LUE, LLE; sensation intact bilateral; no dysmetria; babinski equivocal  Data Reviewed: Data reviewed above in history  Assessment and Plan: Sepsis - Secondary to UTI - Presented with tachycardia and fever - Lactic acid 2.0>> 1.4 - Follow blood and urine cultures - Continue ceftriaxone   Acute diastolic CHF - Start IV furosemide - Accurate I's and O's - Daily weights - Echocardiogram  Acute respiratory  failure with hypoxia - Presented with tachypnea and oxygen saturation 90% room air - Secondary to pulmonary edema and fluid overload - Stable on 2 L - Wean oxygen as tolerated for saturation over 90% - COVID/Flu/RSV--neg - check viral resp panel - VBG  AKI -baseline creatinine 1.1-1.2 -serum creatinine up to 1.89  UTI - UA >50 WBC - Continue ceftriaxone pending culture data  Essential hypertension - Holding amlodipine and ramipril to allow blood pressure margin for diuresis  Persistent atrial fibrillation with RVR - Continue apixaban  - Start IV Lopressor  - TSH  Diabetes mellitus type 2 with hyperglycemia - Hemoglobin A1c - NovoLog sliding scale  OSA - Continue CPAP  Morbid obesity - BMI 53.43 - Modification  Mixed hyperlipidemia - Continue statin     Advance Care Planning: FULL  Consults: none  Family Communication: none  Severity of Illness: The appropriate patient status for this patient is INPATIENT. Inpatient status is judged to be reasonable and necessary in order to provide the required intensity of service to ensure the patient's safety. The patient's presenting symptoms, physical exam findings, and initial radiographic and laboratory data in the context of their chronic comorbidities is felt to place them at high risk for further clinical deterioration. Furthermore, it is not anticipated that the patient will be medically stable for discharge from the hospital within 2 midnights of admission.   * I certify that at the point of admission it is my clinical judgment that the patient will require inpatient hospital care spanning beyond 2 midnights from the point of admission due to high intensity of service, high risk for further deterioration and high frequency of surveillance required.*  Author: Demaris Fillers, MD 12/17/2023 7:55 AM  For on call review [email or web address].

## 2023-12-17 NOTE — Progress Notes (Signed)
   12/17/23 0905  Vitals  BP (!) 163/63  MAP (mmHg) 87  Pulse Rate (!) 110  Level of Consciousness  Level of Consciousness Alert  MEWS COLOR  MEWS Score Color Green  Oxygen Therapy  SpO2 94 %  O2 Device Nasal Cannula  O2 Flow Rate (L/min) 2 L/min  MEWS Score  MEWS Temp 0  MEWS Systolic 0  MEWS Pulse 1  MEWS RR 0  MEWS LOC 0  MEWS Score 1   Scheduled iv metoprolol  given.

## 2023-12-18 ENCOUNTER — Inpatient Hospital Stay (HOSPITAL_COMMUNITY)

## 2023-12-18 DIAGNOSIS — E66813 Obesity, class 3: Secondary | ICD-10-CM | POA: Diagnosis not present

## 2023-12-18 DIAGNOSIS — I5031 Acute diastolic (congestive) heart failure: Secondary | ICD-10-CM | POA: Diagnosis not present

## 2023-12-18 DIAGNOSIS — A419 Sepsis, unspecified organism: Secondary | ICD-10-CM | POA: Diagnosis not present

## 2023-12-18 DIAGNOSIS — J9601 Acute respiratory failure with hypoxia: Secondary | ICD-10-CM | POA: Diagnosis not present

## 2023-12-18 LAB — GLUCOSE, CAPILLARY
Glucose-Capillary: 186 mg/dL — ABNORMAL HIGH (ref 70–99)
Glucose-Capillary: 231 mg/dL — ABNORMAL HIGH (ref 70–99)
Glucose-Capillary: 185 mg/dL — ABNORMAL HIGH (ref 70–99)
Glucose-Capillary: 185 mg/dL — ABNORMAL HIGH (ref 70–99)

## 2023-12-18 LAB — MAGNESIUM: Magnesium: 1.8 mg/dL (ref 1.7–2.4)

## 2023-12-18 LAB — BASIC METABOLIC PANEL WITH GFR
Anion gap: 9 (ref 5–15)
BUN: 42 mg/dL — ABNORMAL HIGH (ref 8–23)
CO2: 21 mmol/L — ABNORMAL LOW (ref 22–32)
Calcium: 8 mg/dL — ABNORMAL LOW (ref 8.9–10.3)
Chloride: 103 mmol/L (ref 98–111)
Creatinine, Ser: 2.28 mg/dL — ABNORMAL HIGH (ref 0.61–1.24)
GFR, Estimated: 30 mL/min — ABNORMAL LOW (ref >60)
Glucose, Bld: 199 mg/dL — ABNORMAL HIGH (ref 70–99)
Potassium: 3.4 mmol/L — ABNORMAL LOW (ref 3.5–5.1)
Sodium: 133 mmol/L — ABNORMAL LOW (ref 135–145)

## 2023-12-18 LAB — IRON AND TIBC
Iron: 10 ug/dL — ABNORMAL LOW (ref 45–182)
Saturation Ratios: 4 % — ABNORMAL LOW (ref 17.9–39.5)
TIBC: 261 ug/dL (ref 250–450)
UIBC: 251 ug/dL

## 2023-12-18 LAB — URINE CULTURE

## 2023-12-18 LAB — FERRITIN: Ferritin: 439 ng/mL — ABNORMAL HIGH (ref 24–336)

## 2023-12-18 LAB — TSH: TSH: 0.861 u[IU]/mL (ref 0.350–4.500)

## 2023-12-18 MED ORDER — METOPROLOL TARTRATE 25 MG PO TABS
25.0000 mg | ORAL_TABLET | Freq: Four times a day (QID) | ORAL | Status: DC
  Administered 2023-12-18 – 2023-12-20 (×8): 25 mg via ORAL
  Filled 2023-12-18 (×9): qty 1

## 2023-12-18 MED ORDER — POTASSIUM CHLORIDE CRYS ER 20 MEQ PO TBCR: 20.0000 meq | EXTENDED_RELEASE_TABLET | Freq: Once | ORAL | Status: DC

## 2023-12-18 NOTE — Plan of Care (Signed)
 Pt alert and oriented x 4. Up with assist. Refused bedalarm to sit on edge of bed using urinal. Tylenol  given for general pain. BLE edema. Low grade fever. After tylenol  improved.  Problem: Education: Goal: Knowledge of General Education information will improve Description: Including pain rating scale, medication(s)/side effects and non-pharmacologic comfort measures Outcome: Progressing   Problem: Health Behavior/Discharge Planning: Goal: Ability to manage health-related needs will improve Outcome: Progressing   Problem: Clinical Measurements: Goal: Ability to maintain clinical measurements within normal limits will improve Outcome: Progressing Goal: Will remain free from infection Outcome: Progressing Goal: Diagnostic test results will improve Outcome: Progressing Goal: Respiratory complications will improve Outcome: Progressing Goal: Cardiovascular complication will be avoided Outcome: Progressing   Problem: Activity: Goal: Risk for activity intolerance will decrease Outcome: Progressing   Problem: Nutrition: Goal: Adequate nutrition will be maintained Outcome: Progressing   Problem: Coping: Goal: Level of anxiety will decrease Outcome: Progressing   Problem: Elimination: Goal: Will not experience complications related to bowel motility Outcome: Progressing Goal: Will not experience complications related to urinary retention Outcome: Progressing   Problem: Pain Managment: Goal: General experience of comfort will improve and/or be controlled Outcome: Progressing   Problem: Safety: Goal: Ability to remain free from injury will improve Outcome: Progressing   Problem: Skin Integrity: Goal: Risk for impaired skin integrity will decrease Outcome: Progressing   Problem: Education: Goal: Ability to describe self-care measures that may prevent or decrease complications (Diabetes Survival Skills Education) will improve Outcome: Progressing Goal: Individualized  Educational Video(s) Outcome: Progressing   Problem: Coping: Goal: Ability to adjust to condition or change in health will improve Outcome: Progressing   Problem: Fluid Volume: Goal: Ability to maintain a balanced intake and output will improve Outcome: Progressing   Problem: Health Behavior/Discharge Planning: Goal: Ability to identify and utilize available resources and services will improve Outcome: Progressing Goal: Ability to manage health-related needs will improve Outcome: Progressing   Problem: Metabolic: Goal: Ability to maintain appropriate glucose levels will improve Outcome: Progressing   Problem: Nutritional: Goal: Maintenance of adequate nutrition will improve Outcome: Progressing Goal: Progress toward achieving an optimal weight will improve Outcome: Progressing   Problem: Skin Integrity: Goal: Risk for impaired skin integrity will decrease Outcome: Progressing   Problem: Tissue Perfusion: Goal: Adequacy of tissue perfusion will improve Outcome: Progressing

## 2023-12-18 NOTE — Progress Notes (Signed)
 Patient declined the use of a CPAP tonight . He was going to sleep in the recliner tonight and didn't want to use CPAP. RT has device on standby for patient to use.

## 2023-12-18 NOTE — Progress Notes (Signed)
   12/18/23 1035  ReDS Vest / Clip  Station Marker D  Ruler Value 46  ReDS Value Range 36 - 40  ReDS Actual Value 38

## 2023-12-18 NOTE — Progress Notes (Addendum)
 PROGRESS NOTE  Evan Berry RUE:[id number] DOB: 1955-09-29 DOA: 12/16/2023 PCP: Aldo Hun, MD  Brief History:  68 year old with a history of persistent atrial fibrillation, hypertension, diabetes mellitus type 2, OSA on CPAP, hepatic steatosis, pancreatic pseudocyst, hypothyroidism, and GERD presenting with generalized weakness and shortness of breath of 1 week duration.  The patient has had some subjective fevers and chills.  He denied any chest pain, coughing, hemoptysis, abdominal pain, nausea, vomiting.  He did have 1 episode of loose stool without any hematochezia or melena.  He has had dysuria for the past 2 weeks.  He has had lower extremity edema for the better part of the last 6 months.  He quit taking his furosemide 6 months ago because he felt like it was not working. In the ED, the patient was febrile up to 103.1 F.  He was tachycardic in 120-130.  He was hemodynamically stable.  Oxygen saturation was 90% on room air.  He was placed on 2 L with saturation up to 95%.  Lactic acid 2.0>> 1.4. The patient was started on ceftriaxone.  He was given 3 L of fluid.   Assessment/Plan: Sepsis - Secondary to UTI -Initially fluid resuscitated - Presented with tachycardia and fever - Lactic acid 2.0>> 1.4 - Follow blood and urine cultures - Continue ceftriaxone -Blood cultures remain negative   Acute diastolic CHF - Started IV furosemide>>stop 6/2 due to uptrending serum creatinine - Accurate I's and O's--incomplete - Daily weights 393>> 385 - 12/17/23 Echo--EF 65 to 70%, normal RVF, trivial MR -Consult cardiology in the setting of atrial fibrillation & continued fluid overload -ReDS = 38   Acute respiratory failure with hypoxia - Presented with tachypnea and oxygen saturation 90% room air - Initially felt to be pulmonary edema and fluid overload, but now more likely multifactorial including OSA/OHS, deconditioning - Stable on 2-3 L - Wean oxygen as tolerated for  saturation over 90% - COVID/Flu/RSV--neg - check viral resp panel--neg - VBG--7.3 6/39/36/22 -CT chest   AKI -baseline creatinine 1.1-1.2--June 2024 - Certainly, patient may have had some progression of underlying CKD -serum creatinine up to 2.28   UTI - UA >50 WBC - Continue ceftriaxone pending culture data - Culture unrevealing, but was collected after antibiotics had been initiated -Continue empiric ceftriaxone for now   Essential hypertension - Holding amlodipine and ramipril to allow blood pressure margin for diuresis   Persistent atrial fibrillation with RVR - Continue apixaban  - Changed to metoprolol  25 mg every 6 hours - TSH--0.61   Uncontrolled diabetes mellitus type 2 with hyperglycemia - 12/17/2023 Hemoglobin A1c--9.1 - NovoLog sliding scale   OSA - Continue CPAP   Morbid obesity - BMI 53.43 - Modification   Mixed hyperlipidemia - Continue statin            Family Communication:  no Family at bedside  Consultants:  cards  Code Status:  FULL   DVT Prophylaxis:  apixaban    Procedures: As Listed in Progress Note Above  Antibiotics: Ceftriaxone 5/31>>     Subjective: Patient states that breathing is better but remains short of breath with exertion and orthopnea.  He states that his lower extremity edema is only slightly better.  Denies any chest pain, abdominal pain, vomiting, diarrhea, abdominal pain.  Objective: Vitals:   12/18/23 0059 12/18/23 0349 12/18/23 1021 12/18/23 1341  BP: 134/81 135/79 (!) 141/70 124/68  Pulse: (!) 109 (!) 105 (!) 103 (!) 109  Resp: 20  20    Temp: (!) 100.4 F (38 C) 98.2 F (36.8 C) 98.7 F (37.1 C) 99.1 F (37.3 C)  TempSrc:  Oral Oral Oral  SpO2: 96% 95% 98% 99%  Weight:  (!) 175 kg    Height:        Intake/Output Summary (Last 24 hours) at 12/18/2023 1421 Last data filed at 12/18/2023 1419 Gross per 24 hour  Intake 1450.29 ml  Output 2000 ml  Net -549.71 ml   Weight change: -2  kg Exam:  General:  Pt is alert, follows commands appropriately, not in acute distress HEENT: No icterus, No thrush, No neck mass, [place]/AT Cardiovascular: RRR, S1/S2, no rubs, no gallops Respiratory: Fine bibasilar rales.  No wheezing Abdomen: Soft/+BS, non tender, non distended, no guarding Extremities: 2+ LE edema, No lymphangitis, No petechiae, No rashes, no synovitis   Data Reviewed: I have personally reviewed following labs and imaging studies Basic Metabolic Panel: Recent Labs  Lab 12/16/23 2147 12/17/23 0737 12/18/23 0319  NA 133* 133* 133*  K 4.2 3.8 3.4*  CL 99 100 103  CO2 21* 21* 21*  GLUCOSE 228* 173* 199*  BUN 42* 42* 42*  CREATININE 1.89* 1.92* 2.28*  CALCIUM 8.5* 8.0* 8.0*  MG  --  1.8 1.8  PHOS  --  3.0  --    Liver Function Tests: Recent Labs  Lab 12/16/23 2147 12/17/23 0737  AST 55* 56*  ALT 33 34  ALKPHOS 43 39  BILITOT 0.8 0.7  PROT 6.8 6.4*  ALBUMIN 3.1* 2.9*   No results for input(s): "LIPASE", "AMYLASE" in the last 168 hours. No results for input(s): "AMMONIA" in the last 168 hours. Coagulation Profile: Recent Labs  Lab 12/16/23 2147  INR 1.7*   CBC: Recent Labs  Lab 12/16/23 2147 12/17/23 0737  WBC 7.4 7.0  NEUTROABS 6.5  --   HGB 11.0* 9.8*  HCT 34.4* 31.9*  MCV 78.5* 79.0*  PLT 145* 137*   Cardiac Enzymes: No results for input(s): "CKTOTAL", "CKMB", "CKMBINDEX", "TROPONINI" in the last 168 hours. BNP: Invalid input(s): "POCBNP" CBG: Recent Labs  Lab 12/17/23 1139 12/17/23 1651 12/17/23 2032 12/18/23 0743 12/18/23 1129  GLUCAP 217* 209* 216* 186* 231*   HbA1C: Recent Labs    12/17/23 0848  HGBA1C 9.1*   Urine analysis:    Component Value Date/Time   COLORURINE AMBER (A) 12/17/2023 0250   APPEARANCEUR CLOUDY (A) 12/17/2023 0250   LABSPEC 1.021 12/17/2023 0250   PHURINE 5.0 12/17/2023 0250   GLUCOSEU 50 (A) 12/17/2023 0250   HGBUR LARGE (A) 12/17/2023 0250   BILIRUBINUR NEGATIVE 12/17/2023 0250    KETONESUR NEGATIVE 12/17/2023 0250   PROTEINUR 100 (A) 12/17/2023 0250   NITRITE NEGATIVE 12/17/2023 0250   LEUKOCYTESUR TRACE (A) 12/17/2023 0250   Sepsis Labs: @LABRCNTIP (procalcitonin:4,lacticidven:4) ) Recent Results (from the past 240 hours)  Blood Culture (routine x 2)     Status: None (Preliminary result)   Collection Time: 12/16/23  9:45 PM   Specimen: Left Antecubital; Blood  Result Value Ref Range Status   Specimen Description LEFT ANTECUBITAL  Final   Special Requests   Final    BOTTLES DRAWN AEROBIC AND ANAEROBIC Blood Culture adequate volume   Culture   Final    NO GROWTH 2 DAYS Performed at [facility], [address] [place], [place] [address]    Report Status PENDING  Incomplete  Resp panel by RT-PCR (RSV, Flu A&B, Covid) Anterior Nasal Swab     Status: None  Collection Time: 12/16/23  9:47 PM   Specimen: Anterior Nasal Swab  Result Value Ref Range Status   SARS Coronavirus 2 by RT PCR NEGATIVE NEGATIVE Final    Comment: (NOTE) SARS-CoV-2 target nucleic acids are NOT DETECTED.  The SARS-CoV-2 RNA is generally detectable in upper respiratory specimens during the acute phase of infection. The lowest concentration of SARS-CoV-2 viral copies this assay can detect is 138 copies/mL. A negative result does not preclude SARS-Cov-2 infection and should not be used as the sole basis for treatment or other patient management decisions. A negative result may occur with  improper specimen collection/handling, submission of specimen other than nasopharyngeal swab, presence of viral mutation(s) within the areas targeted by this assay, and inadequate number of viral copies(<138 copies/mL). A negative result must be combined with clinical observations, patient history, and epidemiological information. The expected result is Negative.  Fact Sheet for Patients:  BloggerCourse.com  Fact Sheet for Healthcare Providers:   SeriousBroker.it  This test is no t yet approved or cleared by the [place]  FDA and  has been authorized for detection and/or diagnosis of SARS-CoV-2 by FDA under an Emergency Use Authorization (EUA). This EUA will remain  in effect (meaning this test can be used) for the duration of the COVID-19 declaration under Section 564(b)(1) of the Act, 21 U.S.C.section 360bbb-3(b)(1), unless the authorization is terminated  or revoked sooner.       Influenza A by PCR NEGATIVE NEGATIVE Final   Influenza B by PCR NEGATIVE NEGATIVE Final    Comment: (NOTE) The Xpert Xpress SARS-CoV-2/FLU/RSV plus assay is intended as an aid in the diagnosis of influenza from Nasopharyngeal swab specimens and should not be used as a sole basis for treatment. Nasal washings and aspirates are unacceptable for Xpert Xpress SARS-CoV-2/FLU/RSV testing.  Fact Sheet for Patients: BloggerCourse.com  Fact Sheet for Healthcare Providers: SeriousBroker.it  This test is not yet approved or cleared by the [place]  FDA and has been authorized for detection and/or diagnosis of SARS-CoV-2 by FDA under an Emergency Use Authorization (EUA). This EUA will remain in effect (meaning this test can be used) for the duration of the COVID-19 declaration under Section 564(b)(1) of the Act, 21 U.S.C. section 360bbb-3(b)(1), unless the authorization is terminated or revoked.     Resp Syncytial Virus by PCR NEGATIVE NEGATIVE Final    Comment: (NOTE) Fact Sheet for Patients: BloggerCourse.com  Fact Sheet for Healthcare Providers: SeriousBroker.it  This test is not yet approved or cleared by the [place]  FDA and has been authorized for detection and/or diagnosis of SARS-CoV-2 by FDA under an Emergency Use Authorization (EUA). This EUA will remain in effect (meaning this test can be used) for  the duration of the COVID-19 declaration under Section 564(b)(1) of the Act, 21 U.S.C. section 360bbb-3(b)(1), unless the authorization is terminated or revoked.  Performed at [facility], [address] [place], [place] [address]   Blood Culture (routine x 2)     Status: None (Preliminary result)   Collection Time: 12/16/23 11:28 PM   Specimen: BLOOD LEFT FOREARM  Result Value Ref Range Status   Specimen Description BLOOD LEFT FOREARM  Final   Special Requests   Final    BOTTLES DRAWN AEROBIC AND ANAEROBIC Blood Culture adequate volume   Culture   Final    NO GROWTH 2 DAYS Performed at [facility], [address], [place], [place] [address]    Report Status PENDING  Incomplete  Urine Culture     Status:  Abnormal   Collection Time: 12/17/23  2:50 AM   Specimen: Urine, Random  Result Value Ref Range Status   Specimen Description   Final    URINE, RANDOM Performed at [facility], [address], [place], [place] [address]    Special Requests   Final    NONE Reflexed from [id number] Performed at [facility], [address] [place], [place] [address]    Culture MULTIPLE SPECIES PRESENT, SUGGEST RECOLLECTION (A)  Final   Report Status 12/18/2023 FINAL  Final  Respiratory (~20 pathogens) panel by PCR     Status: None   Collection Time: 12/17/23  9:12 AM   Specimen: Nasopharyngeal Swab; Respiratory  Result Value Ref Range Status   Adenovirus NOT DETECTED NOT DETECTED Final   Coronavirus 229E NOT DETECTED NOT DETECTED Final    Comment: (NOTE) The Coronavirus on the Respiratory Panel, DOES NOT test for the novel  Coronavirus (2019 nCoV)    Coronavirus HKU1 NOT DETECTED NOT DETECTED Final   Coronavirus NL63 NOT DETECTED NOT DETECTED Final   Coronavirus OC43 NOT DETECTED NOT DETECTED Final   Metapneumovirus NOT DETECTED NOT DETECTED Final   Rhinovirus / Enterovirus NOT DETECTED NOT DETECTED Final   Influenza A NOT DETECTED NOT DETECTED Final   Influenza B NOT DETECTED NOT DETECTED  Final   Parainfluenza Virus 1 NOT DETECTED NOT DETECTED Final   Parainfluenza Virus 2 NOT DETECTED NOT DETECTED Final   Parainfluenza Virus 3 NOT DETECTED NOT DETECTED Final   Parainfluenza Virus 4 NOT DETECTED NOT DETECTED Final   Respiratory Syncytial Virus NOT DETECTED NOT DETECTED Final   Bordetella pertussis NOT DETECTED NOT DETECTED Final   Bordetella Parapertussis NOT DETECTED NOT DETECTED Final   Chlamydophila pneumoniae NOT DETECTED NOT DETECTED Final   Mycoplasma pneumoniae NOT DETECTED NOT DETECTED Final    Comment: Performed at [facility], [address] [place], [place] [address]     Scheduled Meds:  apixaban   5 mg Oral BID   cyanocobalamin  2,500 mcg Oral QHS   ezetimibe  10 mg Oral Daily   insulin aspart  0-9 Units Subcutaneous TID WC   levothyroxine  150 mcg Oral Once per day on Monday Tuesday Wednesday Thursday Friday Saturday   levothyroxine  300 mcg Oral Every Sunday   metoprolol tartrate  25 mg Oral Q6H   pantoprazole  40 mg Oral Daily   potassium chloride  20 mEq Oral Daily   rosuvastatin  20 mg Oral Daily   sodium chloride flush  3 mL Intravenous Q12H   Continuous Infusions:  azithromycin 500 mg (12/18/23 0559)   cefTRIAXone (ROCEPHIN)  IV 2 g (12/17/23 2132)    Procedures/Studies: ECHOCARDIOGRAM COMPLETE Result Date: 12/17/2023    ECHOCARDIOGRAM REPORT   Patient Name:   Evan Berry Date of Exam: 12/17/2023 Medical Rec #:  [id number]        Height:       72.0 in Accession #:    [id number]       Weight:       389.6 lb Date of Birth:  01/06/1956         BSA:          2.828 m Patient Age:    67 years         BP:           14 2/82 mmHg Patient Gender: M                HR:  117 bpm. Exam Location:  Outpatient Procedure: 2D Echo, Color Doppler, Cardiac Doppler and Intracardiac            Opacification Agent (Both Spectral and Color Flow Doppler were            utilized during procedure). Indications:    CHF  History:        Patient has prior history  of Echocardiogram examinations, most                 recent 08/17/2022. Arrythmias:Atrial Fibrillation; Risk                 Factors:Hypertension, Diabetes, Dyslipidemia and Former Smoker.  Sonographer:    Gelene Kelly RDCS Referring Phys: [phone] Kanishk Stroebel IMPRESSIONS  1. Left ventricular ejection fraction, by estimation, is 65 to 70%. The left ventricle has normal function. Left ventricular endocardial border not optimally defined to evaluate regional wall motion. There is mild left ventricular hypertrophy. Left ventricular diastolic parameters are indeterminate.  2. RV not well visualized, grossly appears normal in size and function. . Right ventricular systolic function was not well visualized. The right ventricular size is not well visualized. Tricuspid regurgitation signal is inadequate for assessing PA pressure.  3. The mitral valve was not well visualized. Trivial mitral valve regurgitation. No evidence of mitral stenosis.  4. The aortic valve was not well visualized. Aortic valve regurgitation is not visualized. No aortic stenosis is present. FINDINGS  Left Ventricle: Left ventricular ejection fraction, by estimation, is 65 to 70%. The left ventricle has normal function. Left ventricular endocardial border not optimally defined to evaluate regional wall motion. Definity contrast agent was given IV to delineate the left ventricular endocardial borders. The left ventricular internal cavity size was normal in size. There is mild left ventricular hypertrophy. Left ventricular diastolic parameters are indeterminate. Right Ventricle: RV not well visualized, grossly appears normal in size and function. The right ventricular size is not well visualized. Right vetricular wall thickness was not well visualized. Right ventricular systolic function was not well visualized.  Tricuspid regurgitation signal is inadequate for assessing PA pressure. Left Atrium: Left atrial size was normal in size. Right Atrium: Right atrial  size was normal in size. Pericardium: There is no evidence of pericardial effusion. Mitral Valve: The mitral valve was not well visualized. Trivial mitral valve regurgitation. No evidence of mitral valve stenosis. Tricuspid Valve: The tricuspid valve is not well visualized. Tricuspid valve regurgitation is not demonstrated. No evidence of tricuspid stenosis. Aortic Valve: The aortic valve was not well visualized. Aortic valve regurgitation is not visualized. No aortic stenosis is present. Aortic valve mean gradient measures 7.0 mmHg. Aortic valve peak gradient measures 14.0 mmHg. Aortic valve area, by VTI measures 2.06 cm. Pulmonic Valve: The pulmonic valve was not well visualized. Pulmonic valve regurgitation is not visualized. No evidence of pulmonic stenosis. Aorta: The aortic root is normal in size and structure and the ascending aorta was not well visualized. Venous: Not well visualized. IAS/Shunts: The interatrial septum was not well visualized.  LEFT VENTRICLE PLAX 2D LVIDd:         5.80 cm   Diastology LVIDs:         3.10 cm   LV e' medial:    8.38 cm/s LV PW:         1.20 cm   LV E/e' medial:  13.5 LV IVS:        1.20 cm   LV e' lateral:   9.79 cm/s LVOT  diam:     1.95 cm   LV E/e' lateral: 11.5 LV SV:         57 LV SV Index:   20 LVOT Area:     2.99 cm  RIGHT VENTRICLE RV Basal diam:  5.30 cm RV Mid diam:    3.80 cm RV S prime:     22.90 cm/s TAPSE (M-mode): 2.5 cm LEFT ATRIUM              Index        RIGHT ATRIUM           Index LA diam:        5.50 cm  1.94 cm/m   RA Area:     23.40 cm LA Vol (A2C):   56.7 ml  20.05 ml/m  RA Volume:   68.90 ml  24.36 ml/m LA Vol (A4C):   104.0 ml 36.77 ml/m LA Biplane Vol: 83.4 ml  29.49 ml/m  AORTIC VALVE AV Area (Vmax):    1.98 cm AV Area (Vmean):   2.11 cm AV Area (VTI):     2.06 cm AV Vmax:           187.11 cm/s AV Vmean:          122.803 cm/s AV VTI:            0.276 m AV Peak Grad:      14.0 mmHg AV Mean Grad:      7.0 mmHg LVOT Vmax:         124.00  cm/s LVOT Vmean:        86.600 cm/s LVOT VTI:          0.190 m LVOT/AV VTI ratio: 0.69  AORTA Ao Root diam: 2.60 cm MITRAL VALVE MV Area (PHT): 4.06 cm     SHUNTS MV Decel Time: 187 msec     Systemic VTI:  0.19 m MV E velocity: 113.00 cm/s  Systemic Diam: 1.95 cm MV A velocity: 44.80 cm/s MV E/A ratio:  2.52 Armida Lander MD Electronically signed by Armida Lander MD Signature Date/Time: 12/17/2023/12:55:56 PM    Final    DG Chest Port 1 View Result Date: 12/16/2023 EXAM: 1 VIEW XRAY OF THE CHEST 12/16/2023 09:39:00 PM COMPARISON: 01/06/2023 CLINICAL HISTORY: Questionable sepsis - evaluate for abnormality. Patient from home BIB [facility] w/ complaints of weakness since Tuesday which he noticed after he started to come down w/ cold symptoms- reports chills, cold sweats, intermittent difficulty breathing, congestion, cough. Denies sick contacts. Usually is able to ambulate at home, but has been unable to. EMS reports patient was hypoxic on RA on arrival at 90% and placed patient on 3 L of O2 w/ improvement. Patient w/ hx of Afib and HR 80-110 for EMS. Patient febrile 103.1. hx of diabetes, hypertension. Former smoker. FINDINGS: LUNGS AND PLEURA: No focal pulmonary opacity. No pulmonary edema. No pleural effusion. No pneumothorax. HEART AND MEDIASTINUM: The heart is top normal in size. No acute abnormality of the mediastinal silhouette. BONES AND SOFT TISSUES: No acute osseous abnormality. IMPRESSION: 1. No acute findings. Electronically signed by: Zadie Herter MD 12/16/2023 09:43 PM EDT RP Workstation: [id number]    Demaris Fillers, DO  [facility]  If 7PM-7AM, please contact night-coverage [email or web address] Password [email or web address] 12/18/2023, 2:21 PM   LOS: 1 day

## 2023-12-18 NOTE — Plan of Care (Signed)

## 2023-12-18 NOTE — Plan of Care (Signed)
   Problem: Education: Goal: Knowledge of General Education information will improve Description Including pain rating scale, medication(s)/side effects and non-pharmacologic comfort measures Outcome: Progressing   Problem: Education: Goal: Knowledge of General Education information will improve Description Including pain rating scale, medication(s)/side effects and non-pharmacologic comfort measures Outcome: Progressing

## 2023-12-18 NOTE — Progress Notes (Signed)
   12/18/23 1711  TOC Brief Assessment  Insurance and Status Reviewed  Patient has primary care physician Yes  Home environment has been reviewed From home  Prior level of function: Independent  Prior/Current Home Services No current home services  Social Drivers of Health Review SDOH reviewed no interventions necessary  Readmission risk has been reviewed Yes  Transition of care needs no transition of care needs at this time   Transition of Care Department [facility]) has reviewed patient and no other TOC needs have been identified at this time. We will continue to monitor patient advancement through interdisciplinary progression rounds. If new patient needs arise, please place a TOC consult.

## 2023-12-19 DIAGNOSIS — R6510 Systemic inflammatory response syndrome (SIRS) of non-infectious origin without acute organ dysfunction: Secondary | ICD-10-CM | POA: Diagnosis not present

## 2023-12-19 DIAGNOSIS — N179 Acute kidney failure, unspecified: Secondary | ICD-10-CM | POA: Diagnosis not present

## 2023-12-19 DIAGNOSIS — J181 Lobar pneumonia, unspecified organism: Secondary | ICD-10-CM

## 2023-12-19 DIAGNOSIS — A419 Sepsis, unspecified organism: Secondary | ICD-10-CM | POA: Diagnosis not present

## 2023-12-19 LAB — BASIC METABOLIC PANEL WITH GFR
Anion gap: 9 (ref 5–15)
BUN: 48 mg/dL — ABNORMAL HIGH (ref 8–23)
CO2: 22 mmol/L (ref 22–32)
Calcium: 8 mg/dL — ABNORMAL LOW (ref 8.9–10.3)
Chloride: 99 mmol/L (ref 98–111)
Creatinine, Ser: 2.44 mg/dL — ABNORMAL HIGH (ref 0.61–1.24)
GFR, Estimated: 28 mL/min — ABNORMAL LOW (ref >60)
Glucose, Bld: 167 mg/dL — ABNORMAL HIGH (ref 70–99)
Potassium: 3.3 mmol/L — ABNORMAL LOW (ref 3.5–5.1)
Sodium: 130 mmol/L — ABNORMAL LOW (ref 135–145)

## 2023-12-19 LAB — GLUCOSE, CAPILLARY
Glucose-Capillary: 173 mg/dL — ABNORMAL HIGH (ref 70–99)
Glucose-Capillary: 201 mg/dL — ABNORMAL HIGH (ref 70–99)
Glucose-Capillary: 221 mg/dL — ABNORMAL HIGH (ref 70–99)
Glucose-Capillary: 208 mg/dL — ABNORMAL HIGH (ref 70–99)

## 2023-12-19 LAB — MAGNESIUM: Magnesium: 1.9 mg/dL (ref 1.7–2.4)

## 2023-12-19 LAB — PROTEIN / CREATININE RATIO, URINE
Creatinine, Urine: 114 mg/dL
Protein Creatinine Ratio: 1.47 mg/mg{creat} — ABNORMAL HIGH (ref 0.00–0.15)
Total Protein, Urine: 168 mg/dL

## 2023-12-19 LAB — MRSA NEXT GEN BY PCR, NASAL: MRSA by PCR Next Gen: NOT DETECTED

## 2023-12-19 LAB — SODIUM, URINE, RANDOM: Sodium, Ur: 14 mmol/L

## 2023-12-19 NOTE — Consult Note (Addendum)
 Reason for Consult: AKI/CKD stage III Referring Physician:  Tat, MD  OHN BOSTIC is an 68 y.o. male has a PMH significant for DM type 2, HTN, GERD, HLD, PUD, morbid obesity, OSA on CPAP, persistent atrial fibrillation, and CKD stage III who presented to [facility] ED via EMS on 12/16/23 with a 1 week history of worsening SOB, cough, congestion.  He also complained of generalized weakness, chills, and cold sweats.  He also noted increased lower extremity edema.  Per EMS, SpO2 was 90% and started on supplemental oxygen.  Temp was 103.1.  In the ED, Temp 99.3, Bp 108/60, HR 107, RR 28, SpO2 96%.  Labs were notable for lactate of 2, Na 133, Co2 21, BUN 42, Cr 1.89, Ca 8.5, alb 3.1, AST 55, Hgb 11, plt 145, INR 1.7.  CXR without active disease.  CT scan of chest/abd/pelvis showed left lower lobe airspace disease highly suspicious for pneumonia.  Mild perinephric edema, hepatosplenomegaly with hepatic steatosis, calcified pancreatic pseudocyst.  He was admitted under sepsis protocol and started on antibiotics.  We were consulted due to the development of AKI/CKD stage III.  The trend in Scr is seen below.  No Scr in the last year prior to admission available for review.    Of note, he had been on metformin and ramipril prior to admission.  He denies any NSAID or COX-II inhibitor use.  No N/V/D.  No family history of CKD.  He denies any personal history of CKD despite elevated Scr in the past.  He has had lower extremity edema for the past month.  He also reports urinary hesitancy, weak stream, and urinary retention.   Trend in Creatinine: Creatinine, Ser  Date/Time Value Ref Range Status  12/19/2023 05:20 AM 2.44 (H) 0.61 - 1.24 mg/dL Final  62/13/0865 78:46 AM 2.28 (H) 0.61 - 1.24 mg/dL Final  96/29/5284 13:24 AM 1.92 (H) 0.61 - 1.24 mg/dL Final  40/04/2724 36:64 PM 1.89 (H) 0.61 - 1.24 mg/dL Final  40/34/7425 95:63 AM 1.22 0.61 - 1.24 mg/dL Final  87/56/4332 95:18 AM 1.11 0.61 - 1.24 mg/dL Final  84/16/6063  01:60 AM 1.39 (H) 0.50 - 1.35 mg/dL Final  10/93/2355 73:22 PM 1.63 (H) 0.4 - 1.5 mg/dL Final    PMH:   Past Medical History:  Diagnosis Date   Allergic rhinitis    Arthritis    Blood transfusion without reported diagnosis    Cataract    Chronic kidney disease    pt. denies   Diabetes (HCC)    type 2   Dyspnea on exertion    GERD (gastroesophageal reflux disease)    History of hiatal hernia    Hyperlipidemia    Hypertension    Hypothyroidism    Iron deficiency anemia    OSA (obstructive sleep apnea)    PUD (peptic ulcer disease)    Sleep apnea    WEARS CPAP    PSH:   Past Surgical History:  Procedure Laterality Date   BACK SURGERY  11/2008   x2   bleeding ulcer surgery     CARDIOVERSION N/A 01/29/2020   Procedure: CARDIOVERSION;  Surgeon: Luana Rumple, MD;  Location: [facility] ENDOSCOPY;  Service: Cardiovascular;  Laterality: N/A;   CHOLECYSTECTOMY  1998   COLONOSCOPY     COLONOSCOPY WITH PROPOFOL  N/A 09/26/2022   Procedure: COLONOSCOPY WITH PROPOFOL ;  Surgeon: Asencion Blacksmith, MD;  Location: [facility];  Service: Gastroenterology;  Laterality: N/A;   ESOPHAGOGASTRODUODENOSCOPY (EGD) WITH PROPOFOL  N/A 12/18/2017   Procedure: ESOPHAGOGASTRODUODENOSCOPY (  EGD) WITH PROPOFOL ;  Surgeon: Asencion Blacksmith, MD;  Location: [facility];  Service: Endoscopy;  Laterality: N/A;   ESOPHAGOGASTRODUODENOSCOPY (EGD) WITH PROPOFOL  N/A 09/26/2022   Procedure: ESOPHAGOGASTRODUODENOSCOPY (EGD) WITH PROPOFOL ;  Surgeon: Asencion Blacksmith, MD;  Location: [facility];  Service: Gastroenterology;  Laterality: N/A;   GASTRIC BYPASS  1983   GASTRIC RESTRICTION SURGERY     POLYPECTOMY  12/18/2017   Procedure: POLYPECTOMY;  Surgeon: Asencion Blacksmith, MD;  Location: [facility]COPY;  Service: Endoscopy;;   POLYPECTOMY  09/26/2022   Procedure: POLYPECTOMY;  Surgeon: Asencion Blacksmith, MD;  Location: [facility];  Service: Gastroenterology;;    Allergies:  Allergies  Allergen Reactions   Penicillins Other  (See Comments)    Can't remember reaction, childhood allergy     Medications:   Prior to Admission medications   Medication Sig Start Date End Date Taking? Authorizing Provider  amLODipine (NORVASC) 5 MG tablet Take 5 mg by mouth daily.   Yes [provider]  apixaban  (ELIQUIS ) 5 MG TABS tablet Take 1 tablet (5 mg total) by mouth 2 (two) times daily. appt req for refill [id number] 01/31/22  Yes Asa Lauth, NP  diclofenac Sodium (VOLTAREN) 1 % GEL Apply 2-4 g topically in the morning, at noon, and at bedtime. 11/18/23  Yes [provider]  ezetimibe (ZETIA) 10 MG tablet Take 10 mg by mouth daily.   Yes [provider]  glimepiride (AMARYL) 4 MG tablet Take 4 mg by mouth daily with breakfast.   Yes [provider]  levothyroxine (SYNTHROID, LEVOTHROID) 150 MCG tablet Take 150-300 mcg by mouth See admin instructions.  Take 300 mg at bedtime on Sunday. All the other days take 150 mg at bedtime   Yes [provider]  metFORMIN (GLUCOPHAGE-XR) 500 MG 24 hr tablet Take 1,000 mg by mouth 2 (two) times daily with a meal. 10/12/17  Yes [provider]  metoprolol  tartrate (LOPRESSOR ) 25 MG tablet Take 25 mg by mouth 2 (two) times daily.   Yes [provider]  omeprazole  (PRILOSEC) 20 MG capsule TAKE 1 CAPSULE (20 MG TOTAL) BY MOUTH 2 (TWO) TIMES DAILY BEFORE A MEAL. 12/25/18  Yes Asencion Blacksmith, MD  ramipril (ALTACE) 10 MG capsule Take 10 mg by mouth 2 (two) times daily. 10/03/19  Yes [provider]  rosuvastatin (CRESTOR) 20 MG tablet Take 20 mg by mouth daily. 07/05/22  Yes [provider]  acetaminophen  (TYLENOL ) 650 MG CR tablet Take 1,950 mg by mouth at bedtime.    [provider]  Alpha-Lipoic Acid 300 MG CAPS Take 300 mg by mouth daily.    [provider]  Cyanocobalamin (B-12) 2500 MCG TABS Take 2,500 mcg by mouth at bedtime.    [provider]  HYDROcodone -acetaminophen  (NORCO) 10-325 MG  tablet Take 1 tablet by mouth every 6 (six) hours as needed for severe pain or moderate pain. 01/07/23   Molpus, John, MD  Multiple Vitamin (MULTIVITAMIN) capsule Take 1 capsule by mouth at bedtime.    [provider]  Omega-3 Fatty Acids (FISH OIL) 1000 MG CAPS Take 1,000 mg by mouth in the morning.    [provider]  triamcinolone (NASACORT ALLERGY 24HR) 55 MCG/ACT AERO nasal inhaler Place 2 sprays into the nose in the morning.    [provider]  zinc gluconate 50 MG tablet Take 50 mg by mouth 2 (two) times a week.    [provider]    Inpatient medications:  apixaban   5 mg Oral BID   cyanocobalamin  2,500 mcg Oral QHS   ezetimibe  10 mg Oral Daily   insulin aspart  0-9 Units Subcutaneous TID WC   levothyroxine  150 mcg Oral Once per day on Monday Tuesday Wednesday Thursday Friday Saturday   levothyroxine  300 mcg Oral Every Sunday   metoprolol  tartrate  25 mg Oral Q6H   pantoprazole  40 mg Oral Daily   potassium chloride  20 mEq Oral Daily   rosuvastatin  20 mg Oral Daily   sodium chloride  flush  3 mL Intravenous Q12H    Discontinued Meds:   Medications Discontinued During This Encounter  Medication Reason   enoxaparin (LOVENOX) injection 40 mg Duplicate   levalbuterol (XOPENEX) nebulizer solution 0.63 mg    apixaban  (ELIQUIS ) tablet 5 mg Duplicate   levothyroxine (SYNTHROID) tablet 150 mcg    levalbuterol (XOPENEX) nebulizer solution 0.63 mg    metoprolol  tartrate (LOPRESSOR ) injection 2.5 mg Duplicate   furosemide (LASIX) injection 40 mg    metoprolol  tartrate (LOPRESSOR ) injection 2.5 mg    potassium chloride SA (KLOR-CON M) CR tablet 20 mEq    ONETOUCH VERIO test strip Inpatient Standard    Social History:  reports that he quit smoking about 43 years ago. His smoking use included cigarettes. He started smoking about 44 years ago. He has a 1 pack-year smoking history. He has never used smokeless tobacco. He reports that he does not drink  alcohol  and does not use drugs.  Family History:   Family History  Problem Relation Age of Onset   Heart disease Maternal Grandmother    Heart disease Mother    Cancer Paternal Grandmother        unsure what kind   Lung cancer Father    Colon cancer Neg Hx    Esophageal cancer Neg Hx    Rectal cancer Neg Hx    Stomach cancer Neg Hx     Pertinent items are noted in HPI. Weight change: -0.5 kg  Intake/Output Summary (Last 24 hours) at 12/19/2023 0821 Last data filed at 12/19/2023 0500 Gross per 24 hour  Intake 914.59 ml  Output 850 ml  Net 64.59 ml   BP (!) 130/90   Pulse (!) 109   Temp 98.1 F (36.7 C) (Oral)   Resp 19   Ht 6' (1.829 m)   Wt (!) 176.2 kg   SpO2 97%   BMI 52.68 kg/m  Vitals:   12/18/23 1915 12/18/23 2037 12/19/23 0423 12/19/23 0500  BP: (!) 130/98  (!) 130/90   Pulse: (!) 105  (!) 109   Resp:   19   Temp: 98.7 F (37.1 C)  98.1 F (36.7 C)   TempSrc: Oral  Oral   SpO2: 98% 96% 97%   Weight:    (!) 176.2 kg  Height:         General appearance: alert, cooperative, no distress, and morbidly obese Head: Normocephalic, without obvious abnormality, atraumatic Resp: diminished breath sounds bibasilar Cardio: irregularly irregular rhythm and no rub GI: soft, non-tender; bowel sounds normal; no masses,  no organomegaly Extremities: edema 1+ pitting edema bilateral lower extremities  Labs: Basic Metabolic Panel: Recent Labs  Lab 12/16/23 2147 12/17/23 0737 12/18/23 0319 12/19/23 0520  NA 133* 133* 133* 130*  K 4.2 3.8 3.4* 3.3*  CL 99 100 103 99  CO2 21* 21* 21* 22  GLUCOSE 228* 173* 199* 167*  BUN 42* 42* 42* 48*  CREATININE 1.89* 1.92* 2.28* 2.44*  ALBUMIN 3.1* 2.9*  --   --   CALCIUM 8.5* 8.0* 8.0* 8.0*  PHOS  --  3.0  --   --    Liver Function Tests: Recent Labs  Lab 12/16/23 2147 12/17/23 0737  AST 55* 56*  ALT 33 34  ALKPHOS 43 39  BILITOT 0.8 0.7  PROT 6.8 6.4*  ALBUMIN 3.1* 2.9*   No results for input(s): "LIPASE",  "AMYLASE" in the last 168 hours. No results for input(s): "AMMONIA" in the last 168 hours. CBC: Recent Labs  Lab 12/16/23 2147 12/17/23 0737  WBC 7.4 7.0  NEUTROABS 6.5  --   HGB 11.0* 9.8*  HCT 34.4* 31.9*  MCV 78.5* 79.0*  PLT 145* 137*   PT/INR: @LABRCNTIP (inr:5) Cardiac Enzymes: )No results for input(s): "CKTOTAL", "CKMB", "CKMBINDEX", "TROPONINI" in the last 168 hours. CBG: Recent Labs  Lab 12/18/23 0743 12/18/23 1129 12/18/23 1619 12/18/23 2121 12/19/23 0725  GLUCAP 186* 231* 185* 185* 173*    Iron Studies:  Recent Labs  Lab 12/18/23 0319  IRON 10*  TIBC 261  FERRITIN 439*    Xrays/Other Studies: CT CHEST ABDOMEN PELVIS WO CONTRAST Result Date: 12/18/2023 CLINICAL DATA:  UTI, persistent fever, concerned about pyelonephritis, abscess Weakness.  Chills. EXAM: CT CHEST, ABDOMEN AND PELVIS WITHOUT CONTRAST TECHNIQUE: Multidetector CT imaging of the chest, abdomen and pelvis was performed following the standard protocol without IV contrast. RADIATION DOSE REDUCTION: This exam was performed according to the departmental dose-optimization program which includes automated exposure control, adjustment of the mA and/or kV according to patient size and/or use of iterative reconstruction technique. COMPARISON:  Chest radiograph 12/16/2023. Chest, abdomen, pelvic CT 01/07/2023 FINDINGS: CT CHEST FINDINGS Cardiovascular: The heart is upper normal in size. No significant pericardial effusion. Coronary artery and mitral annulus calcifications. Aortic atherosclerosis without aneurysm. Mediastinum/Nodes: Multiple small mediastinal lymph nodes, not enlarged by size criteria. Hilar assessment is limited in the absence of IV contrast. Mild distal esophageal wall thickening. No visible thyroid  nodule. Lungs/Pleura: Left lower lobe airspace disease standing from the fissure to the pleural surface with air bronchograms, highly suspicious for pneumonia. Surrounding ground-glass opacity throughout  the left lower lobe. Trace left pleural effusion. No additional focal airspace disease. The trachea and central airways are clear. Musculoskeletal: Multiple remote right rib fractures. Thoracic spondylosis with anterior spurring. There are no acute or suspicious osseous abnormalities. CT ABDOMEN PELVIS FINDINGS Hepatobiliary: The liver is enlarged spanning 23.6 cm cranial caudal. Diffuse hepatic steatosis. No evidence of focal liver abnormality on this unenhanced exam. Clips in the gallbladder fossa postcholecystectomy. No biliary dilatation. Pancreas: No ductal dilatation or inflammation. Elongated peripherally calcified density adjacent to the pancreatic body and tail is unchanged from prior exam. Spleen: Enlarged, 16 cm AP.  No focal abnormality. Adrenals/Urinary Tract: Left adrenal mass with macroscopic fat measuring 5.4 cm consistent with a benign adrenal myelolipoma. Normal right adrenal gland. No hydronephrosis. Mild symmetric bilateral perinephric edema. There is no obvious renal fluid collection on this unenhanced exam. Soft tissue attenuation from habitus limits detailed assessment. Small right lower pole renal cyst. No visible renal calculi. Unremarkable urinary bladder. Stomach/Bowel: Administered enteric contrast reaches the distal small bowel and colon. There is no bowel obstruction. No evidence of bowel inflammation. Postsurgical change in the stomach. The appendix is normal. Vascular/Lymphatic: Normal caliber abdominal aorta with mild atherosclerosis. Left upper quadrant collaterals are unchanged from prior exam. Reactive appearing prominent periportal nodes measuring up to 12 mm, series 2, image 68. Reproductive: Prostate is unremarkable. Other: No ascites or  free air. Moderate-sized supraumbilical ventral abdominal wall hernia contains only fat. There is a diminutive fat containing umbilical hernia. Musculoskeletal: Degenerative and postsurgical change in the lumbar spine. IMPRESSION: 1. Left lower  lobe airspace disease with air bronchograms, highly suspicious for pneumonia. Trace left pleural effusion. 2. Mild symmetric bilateral perinephric edema, nonspecific. No obvious renal fluid collection on this unenhanced exam. 3. Hepatosplenomegaly with hepatic steatosis. 4. Chronic calcified peripancreatic structure likely a chronically calcified pseudocyst. This is unchanged from prior imaging, and needs no specific imaging follow-up. 5. Mild distal esophageal wall thickening, can be seen with reflux. 6. Moderate-sized supraumbilical ventral abdominal wall hernia contains only fat. Aortic Atherosclerosis (ICD10-I70.0). Electronically Signed   By: Chadwick Colonel M.D.   On: 12/18/2023 19:59   US  Venous Img Lower Bilateral (DVT) Result Date: 12/18/2023 CLINICAL DATA:  Bilateral lower extremity edema EXAM: BILATERAL LOWER EXTREMITY VENOUS DOPPLER ULTRASOUND TECHNIQUE: Gray-scale sonography with graded compression, as well as color Doppler and duplex ultrasound were performed to evaluate the lower extremity deep venous systems from the level of the common femoral vein and including the common femoral, femoral, profunda femoral, popliteal and calf veins including the posterior tibial, peroneal and gastrocnemius veins when visible. The superficial great saphenous vein was also interrogated. Spectral Doppler was utilized to evaluate flow at rest and with distal augmentation maneuvers in the common femoral, femoral and popliteal veins. COMPARISON:  None Available. FINDINGS: RIGHT LOWER EXTREMITY Common Femoral Vein: No evidence of thrombus. Normal compressibility, respiratory phasicity and response to augmentation. Saphenofemoral Junction: No evidence of thrombus. Normal compressibility and flow on color Doppler imaging. Profunda Femoral Vein: No evidence of thrombus. Normal compressibility and flow on color Doppler imaging. Femoral Vein: No evidence of thrombus. Normal compressibility, respiratory phasicity and  response to augmentation. Popliteal Vein: No evidence of thrombus. Normal compressibility, respiratory phasicity and response to augmentation. Calf Veins: No evidence of thrombus. Normal compressibility and flow on color Doppler imaging. Superficial Great Saphenous Vein: No evidence of thrombus. Normal compressibility. Venous Reflux:  None. Other Findings:  None. LEFT LOWER EXTREMITY Common Femoral Vein: No evidence of thrombus. Normal compressibility, respiratory phasicity and response to augmentation. Saphenofemoral Junction: No evidence of thrombus. Normal compressibility and flow on color Doppler imaging. Profunda Femoral Vein: No evidence of thrombus. Normal compressibility and flow on color Doppler imaging. Femoral Vein: No evidence of thrombus. Normal compressibility, respiratory phasicity and response to augmentation. Popliteal Vein: No evidence of thrombus. Normal compressibility, respiratory phasicity and response to augmentation. Calf Veins: No evidence of thrombus. Normal compressibility and flow on color Doppler imaging. Superficial Great Saphenous Vein: No evidence of thrombus. Normal compressibility. Venous Reflux:  None. Other Findings:  None. IMPRESSION: No evidence of deep venous thrombosis in either lower extremity. Electronically Signed   By: Fernando Hoyer M.D.   On: 12/18/2023 15:47   ECHOCARDIOGRAM COMPLETE Result Date: 12/17/2023    ECHOCARDIOGRAM REPORT   Patient Name:   Evan Berry Date of Exam: 12/17/2023 Medical Rec #:  [id number]        Height:       72.0 in Accession #:    [id number]       Weight:       389.6 lb Date of Birth:  Sep 25, 1955         BSA:          2.828 m Patient Age:    67 years         BP:  142/82 mmHg Patient Gender: M                HR:           117 bpm. Exam Location:  Outpatient Procedure: 2D Echo, Color Doppler, Cardiac Doppler and Intracardiac            Opacification Agent (Both Spectral and Color Flow Doppler were            utilized during  procedure). Indications:    CHF  History:        Patient has prior history of Echocardiogram examinations, most                 recent 08/17/2022. Arrythmias:Atrial Fibrillation; Risk                 Factors:Hypertension, Diabetes, Dyslipidemia and Former Smoker.  Sonographer:    Gelene Kelly RDCS Referring Phys: [phone] DAVID TAT IMPRESSIONS  1. Left ventricular ejection fraction, by estimation, is 65 to 70%. The left ventricle has normal function. Left ventricular endocardial border not optimally defined to evaluate regional wall motion. There is mild left ventricular hypertrophy. Left ventricular diastolic parameters are indeterminate.  2. RV not well visualized, grossly appears normal in size and function. . Right ventricular systolic function was not well visualized. The right ventricular size is not well visualized. Tricuspid regurgitation signal is inadequate for assessing PA pressure.  3. The mitral valve was not well visualized. Trivial mitral valve regurgitation. No evidence of mitral stenosis.  4. The aortic valve was not well visualized. Aortic valve regurgitation is not visualized. No aortic stenosis is present. FINDINGS  Left Ventricle: Left ventricular ejection fraction, by estimation, is 65 to 70%. The left ventricle has normal function. Left ventricular endocardial border not optimally defined to evaluate regional wall motion. Definity contrast agent was given IV to delineate the left ventricular endocardial borders. The left ventricular internal cavity size was normal in size. There is mild left ventricular hypertrophy. Left ventricular diastolic parameters are indeterminate. Right Ventricle: RV not well visualized, grossly appears normal in size and function. The right ventricular size is not well visualized. Right vetricular wall thickness was not well visualized. Right ventricular systolic function was not well visualized.  Tricuspid regurgitation signal is inadequate for assessing PA pressure.  Left Atrium: Left atrial size was normal in size. Right Atrium: Right atrial size was normal in size. Pericardium: There is no evidence of pericardial effusion. Mitral Valve: The mitral valve was not well visualized. Trivial mitral valve regurgitation. No evidence of mitral valve stenosis. Tricuspid Valve: The tricuspid valve is not well visualized. Tricuspid valve regurgitation is not demonstrated. No evidence of tricuspid stenosis. Aortic Valve: The aortic valve was not well visualized. Aortic valve regurgitation is not visualized. No aortic stenosis is present. Aortic valve mean gradient measures 7.0 mmHg. Aortic valve peak gradient measures 14.0 mmHg. Aortic valve area, by VTI measures 2.06 cm. Pulmonic Valve: The pulmonic valve was not well visualized. Pulmonic valve regurgitation is not visualized. No evidence of pulmonic stenosis. Aorta: The aortic root is normal in size and structure and the ascending aorta was not well visualized. Venous: Not well visualized. IAS/Shunts: The interatrial septum was not well visualized.  LEFT VENTRICLE PLAX 2D LVIDd:         5.80 cm   Diastology LVIDs:         3.10 cm   LV e' medial:    8.38 cm/s LV PW:  1.20 cm   LV E/e' medial:  13.5 LV IVS:        1.20 cm   LV e' lateral:   9.79 cm/s LVOT diam:     1.95 cm   LV E/e' lateral: 11.5 LV SV:         57 LV SV Index:   20 LVOT Area:     2.99 cm  RIGHT VENTRICLE RV Basal diam:  5.30 cm RV Mid diam:    3.80 cm RV S prime:     22.90 cm/s TAPSE (M-mode): 2.5 cm LEFT ATRIUM              Index        RIGHT ATRIUM           Index LA diam:        5.50 cm  1.94 cm/m   RA Area:     23.40 cm LA Vol (A2C):   56.7 ml  20.05 ml/m  RA Volume:   68.90 ml  24.36 ml/m LA Vol (A4C):   104.0 ml 36.77 ml/m LA Biplane Vol: 83.4 ml  29.49 ml/m  AORTIC VALVE AV Area (Vmax):    1.98 cm AV Area (Vmean):   2.11 cm AV Area (VTI):     2.06 cm AV Vmax:           187.11 cm/s AV Vmean:          122.803 cm/s AV VTI:            0.276 m AV Peak  Grad:      14.0 mmHg AV Mean Grad:      7.0 mmHg LVOT Vmax:         124.00 cm/s LVOT Vmean:        86.600 cm/s LVOT VTI:          0.190 m LVOT/AV VTI ratio: 0.69  AORTA Ao Root diam: 2.60 cm MITRAL VALVE MV Area (PHT): 4.06 cm     SHUNTS MV Decel Time: 187 msec     Systemic VTI:  0.19 m MV E velocity: 113.00 cm/s  Systemic Diam: 1.95 cm MV A velocity: 44.80 cm/s MV E/A ratio:  2.52 Armida Lander MD Electronically signed by Armida Lander MD Signature Date/Time: 12/17/2023/12:55:56 PM    Final      Assessment/Plan:  AKI/CKD stage III - presumably ischemic ATN in the setting of sepsis/hypotension, volume depletion, and concomitant ACE inhibition.  No hydronephrosis on CT scan.  Scr climbing, however UOP has improved after IVF's and improvement of Bp.  No indication for dialysis at this time.  Will continue to follow closely.  Continue to hold metformin and ramipril.  Avoid nephrotoxic medications including NSAIDs and iodinated intravenous contrast exposure unless the latter is absolutely indicated.   Preferred narcotic agents for pain control are hydromorphone, fentanyl, and methadone. Morphine should not be used.  Avoid Baclofen and avoid oral sodium phosphate and magnesium citrate based laxatives / bowel preps.  Continue strict Input and Output monitoring. Will monitor the patient closely with you and intervene or adjust therapy as indicated by changes in clinical status/labs  Sepsis - possibly due to pneumonia as seen on CT scan but also possible UTI per primary svc.  Urine culture with multiple organisms.  Continue with ceftriaxone for now. Acute hypoxic respiratory failure - LLL airspace disease on CT scan.  Continue with supplemental oxygen and abx. HTN - bp on low side and holding amlodipine and ramipril for now. Acute  diastolic CHF - reports edema worsening over the last month.  ECHO with preserved EF but unable to evaluate LV diastolic parameters.  Hold off on diuresis for now given AKI.   OSA  continue CPAP at bedtime. Hypervolemic hyponatremia - asymptomatic.  Follow for now. Iron deficiency anemia - TSAT 4%.  Hgb dropped to 9.8 and has history of GIB.  Continue to follow and transfuse for Hgb <7 Atrial fibrillation - persistent.  Rate better controlled at this time.     Drexel Gentles A Noah Lembke 12/19/2023, 8:21 AM

## 2023-12-19 NOTE — Progress Notes (Signed)
 Pt declined CPAP at this time but stated he would call later if he decides to try it. RT will continue to monitor

## 2023-12-19 NOTE — Progress Notes (Signed)
 PROGRESS NOTE  Evan Berry ZOX:[id number] DOB: May 09, 1956 DOA: 12/16/2023 PCP: Aldo Hun, MD  Brief History:  68 year old with a history of persistent atrial fibrillation, hypertension, diabetes mellitus type 2, OSA on CPAP, hepatic steatosis, pancreatic pseudocyst, hypothyroidism, and GERD presenting with generalized weakness and shortness of breath of 1 week duration.  The patient has had some subjective fevers and chills.  He denied any chest pain, coughing, hemoptysis, abdominal pain, nausea, vomiting.  He did have 1 episode of loose stool without any hematochezia or melena.  He has had dysuria for the past 2 weeks.  He has had lower extremity edema for the better part of the last 6 months.  He quit taking his furosemide 6 months ago because he felt like it was not working. In the ED, the patient was febrile up to 103.1 F.  He was tachycardic in 120-130.  He was hemodynamically stable.  Oxygen saturation was 90% on room air.  He was placed on 2 L with saturation up to 95%.  Lactic acid 2.0>> 1.4. The patient was started on ceftriaxone.  He was given 3 L of fluid.   Assessment/Plan:  Sepsis - Secondary to UTI and pneumonia -Initially fluid resuscitated - Presented with tachycardia and fever - Lactic acid 2.0>> 1.4 - Follow blood and urine cultures - Continue ceftriaxone/azithro - Blood cultures remain negative   Acute diastolic CHF - Started IV furosemide>>stop 6/2 due to uptrending serum creatinine - Accurate I's and O's--incomplete - Daily weights 393>> 385 - 12/17/23 Echo--EF 65 to 70%, normal RVF, trivial MR -ReDS = 38>>36   Acute respiratory failure with hypoxia - Presented with tachypnea and oxygen saturation 90% room air - Initially felt to be pulmonary edema and fluid overload, but now more likely multifactorial including pneumonia and OSA/OHS, deconditioning - Stable on 2-3 L - Wean oxygen as tolerated for saturation over 90% - COVID/Flu/RSV--neg - check  viral resp panel--neg - VBG--7.3 6/39/36/22 -6/2 CT chest--LLL airsp disease with air bronchograms, trace L-effusion   AKI on CKD 3b -baseline creatinine 1.1-1.2--June 2024 - likely has progression of underlying CKD - new baseline 1.8-1.9 - serum creatinine up to 2.44 - appreciate nephrology  Lobar Pneumonia -6/2 CT chest--LLL airsp disease with air bronchograms, trace L-effusion -MRSA neg -continue ceftriaxone and azithro   Pyelonephritis - UA >50 WBC - Continue ceftriaxone pending culture data - Culture unrevealing, but was collected after antibiotics had been initiated -Continue empiric ceftriaxone  - 6/2 CT abd/pelvis--mild symmetric perinephric edema without fluid collection   Essential hypertension - Holding amlodipine and ramipril to allow blood pressure margin for diuresis   Persistent atrial fibrillation with RVR - Continue apixaban  - Changed to metoprolol  25 mg every 6 hours - TSH--0.61   Uncontrolled diabetes mellitus type 2 with hyperglycemia - 12/17/2023 Hemoglobin A1c--9.1 - NovoLog sliding scale   OSA - Continue CPAP   Morbid obesity - BMI 53.43 - Modification   Mixed hyperlipidemia - Continue statin                     Family Communication:  spouse on 6/3   Consultants:  cards   Code Status:  FULL    DVT Prophylaxis:  apixaban      Procedures: As Listed in Progress Note Above   Antibiotics: Ceftriaxone 5/31>> Azithro 6/1>>            Subjective: Pt is breathing better.  Denies f/c, cp, n/v/d, abd pain  Objective: Vitals:   12/19/23 0905 12/19/23 1228 12/19/23 1312 12/19/23 1508  BP: 122/82 117/73 121/72   Pulse: 97 (!) 101 87   Resp:      Temp: 98.1 F (36.7 C) 98.9 F (37.2 C) 98.7 F (37.1 C)   TempSrc: Oral Oral Oral   SpO2: 99% 94% 96% 97%  Weight:      Height:        Intake/Output Summary (Last 24 hours) at 12/19/2023 1700 Last data filed at 12/19/2023 1403 Gross per 24 hour  Intake 674.59 ml  Output  750 ml  Net -75.41 ml   Weight change: -0.5 kg Exam:  General:  Pt is alert, follows commands appropriately, not in acute distress HEENT: No icterus, No thrush, No neck mass, [place]/AT Cardiovascular: IRRR, S1/S2, no rubs, no gallops Respiratory: bibasilar rales, L>R Abdomen: Soft/+BS, non tender, non distended, no guarding Extremities: 2 + LE edema, No lymphangitis, No petechiae, No rashes, no synovitis   Data Reviewed: I have personally reviewed following labs and imaging studies Basic Metabolic Panel: Recent Labs  Lab 12/16/23 2147 12/17/23 0737 12/18/23 0319 12/19/23 0520  NA 133* 133* 133* 130*  K 4.2 3.8 3.4* 3.3*  CL 99 100 103 99  CO2 21* 21* 21* 22  GLUCOSE 228* 173* 199* 167*  BUN 42* 42* 42* 48*  CREATININE 1.89* 1.92* 2.28* 2.44*  CALCIUM 8.5* 8.0* 8.0* 8.0*  MG  --  1.8 1.8 1.9  PHOS  --  3.0  --   --    Liver Function Tests: Recent Labs  Lab 12/16/23 2147 12/17/23 0737  AST 55* 56*  ALT 33 34  ALKPHOS 43 39  BILITOT 0.8 0.7  PROT 6.8 6.4*  ALBUMIN 3.1* 2.9*   No results for input(s): "LIPASE", "AMYLASE" in the last 168 hours. No results for input(s): "AMMONIA" in the last 168 hours. Coagulation Profile: Recent Labs  Lab 12/16/23 2147  INR 1.7*   CBC: Recent Labs  Lab 12/16/23 2147 12/17/23 0737  WBC 7.4 7.0  NEUTROABS 6.5  --   HGB 11.0* 9.8*  HCT 34.4* 31.9*  MCV 78.5* 79.0*  PLT 145* 137*   Cardiac Enzymes: No results for input(s): "CKTOTAL", "CKMB", "CKMBINDEX", "TROPONINI" in the last 168 hours. BNP: Invalid input(s): "POCBNP" CBG: Recent Labs  Lab 12/18/23 1619 12/18/23 2121 12/19/23 0725 12/19/23 1140 12/19/23 1615  GLUCAP 185* 185* 173* 201* 221*   HbA1C: Recent Labs    12/17/23 0848  HGBA1C 9.1*   Urine analysis:    Component Value Date/Time   COLORURINE AMBER (A) 12/17/2023 0250   APPEARANCEUR CLOUDY (A) 12/17/2023 0250   LABSPEC 1.021 12/17/2023 0250   PHURINE 5.0 12/17/2023 0250   GLUCOSEU 50 (A)  12/17/2023 0250   HGBUR LARGE (A) 12/17/2023 0250   BILIRUBINUR NEGATIVE 12/17/2023 0250   KETONESUR NEGATIVE 12/17/2023 0250   PROTEINUR 100 (A) 12/17/2023 0250   NITRITE NEGATIVE 12/17/2023 0250   LEUKOCYTESUR TRACE (A) 12/17/2023 0250   Sepsis Labs: @LABRCNTIP (procalcitonin:4,lacticidven:4) ) Recent Results (from the past 240 hours)  Blood Culture (routine x 2)     Status: None (Preliminary result)   Collection Time: 12/16/23  9:45 PM   Specimen: Left Antecubital; Blood  Result Value Ref Range Status   Specimen Description LEFT ANTECUBITAL  Final   Special Requests   Final    BOTTLES DRAWN AEROBIC AND ANAEROBIC Blood Culture adequate volume   Culture   Final    NO GROWTH 3 DAYS Performed at [facility],  [address], [place], [place] [address]    Report Status PENDING  Incomplete  Resp panel by RT-PCR (RSV, Flu A&B, Covid) Anterior Nasal Swab     Status: None   Collection Time: 12/16/23  9:47 PM   Specimen: Anterior Nasal Swab  Result Value Ref Range Status   SARS Coronavirus 2 by RT PCR NEGATIVE NEGATIVE Final    Comment: (NOTE) SARS-CoV-2 target nucleic acids are NOT DETECTED.  The SARS-CoV-2 RNA is generally detectable in upper respiratory specimens during the acute phase of infection. The lowest concentration of SARS-CoV-2 viral copies this assay can detect is 138 copies/mL. A negative result does not preclude SARS-Cov-2 infection and should not be used as the sole basis for treatment or other patient management decisions. A negative result may occur with  improper specimen collection/handling, submission of specimen other than nasopharyngeal swab, presence of viral mutation(s) within the areas targeted by this assay, and inadequate number of viral copies(<138 copies/mL). A negative result must be combined with clinical observations, patient history, and epidemiological information. The expected result is Negative.  Fact Sheet for Patients:   BloggerCourse.com  Fact Sheet for Healthcare Providers:  SeriousBroker.it  This test is no t yet approved or cleared by the [place]  FDA and  has been authorized for detection and/or diagnosis of SARS-CoV-2 by FDA under an Emergency Use Authorization (EUA). This EUA will remain  in effect (meaning this test can be used) for the duration of the COVID-19 declaration under Section 564(b)(1) of the Act, 21 U.S.C.section 360bbb-3(b)(1), unless the authorization is terminated  or revoked sooner.       Influenza A by PCR NEGATIVE NEGATIVE Final   Influenza B by PCR NEGATIVE NEGATIVE Final    Comment: (NOTE) The Xpert Xpress SARS-CoV-2/FLU/RSV plus assay is intended as an aid in the diagnosis of influenza from Nasopharyngeal swab specimens and should not be used as a sole basis for treatment. Nasal washings and aspirates are unacceptable for Xpert Xpress SARS-CoV-2/FLU/RSV testing.  Fact Sheet for Patients: BloggerCourse.com  Fact Sheet for Healthcare Providers: SeriousBroker.it  This test is not yet approved or cleared by the [place]  FDA and has been authorized for detection and/or diagnosis of SARS-CoV-2 by FDA under an Emergency Use Authorization (EUA). This EUA will remain in effect (meaning this test can be used) for the duration of the COVID-19 declaration under Section 564(b)(1) of the Act, 21 U.S.C. section 360bbb-3(b)(1), unless the authorization is terminated or revoked.     Resp Syncytial Virus by PCR NEGATIVE NEGATIVE Final    Comment: (NOTE) Fact Sheet for Patients: BloggerCourse.com  Fact Sheet for Healthcare Providers: SeriousBroker.it  This test is not yet approved or cleared by the [place]  FDA and has been authorized for detection and/or diagnosis of SARS-CoV-2 by FDA under an Emergency Use  Authorization (EUA). This EUA will remain in effect (meaning this test can be used) for the duration of the COVID-19 declaration under Section 564(b)(1) of the Act, 21 U.S.C. section 360bbb-3(b)(1), unless the authorization is terminated or revoked.  Performed at [facility], [address], [place], [place] [address]   Blood Culture (routine x 2)     Status: None (Preliminary result)   Collection Time: 12/16/23 11:28 PM   Specimen: BLOOD LEFT FOREARM  Result Value Ref Range Status   Specimen Description BLOOD LEFT FOREARM  Final   Special Requests   Final    BOTTLES DRAWN AEROBIC AND ANAEROBIC Blood Culture adequate volume   Culture   Final  NO GROWTH 3 DAYS Performed at [facility], [address] [place], [place] [address]    Report Status PENDING  Incomplete  Urine Culture     Status: Abnormal   Collection Time: 12/17/23  2:50 AM   Specimen: Urine, Random  Result Value Ref Range Status   Specimen Description   Final    URINE, RANDOM Performed at [facility], [address], [place], [place] [address]    Special Requests   Final    NONE Reflexed from [id number] Performed at [facility], [address] [place], [place] [address]    Culture MULTIPLE SPECIES PRESENT, SUGGEST RECOLLECTION (A)  Final   Report Status 12/18/2023 FINAL  Final  Respiratory (~20 pathogens) panel by PCR     Status: None   Collection Time: 12/17/23  9:12 AM   Specimen: Nasopharyngeal Swab; Respiratory  Result Value Ref Range Status   Adenovirus NOT DETECTED NOT DETECTED Final   Coronavirus 229E NOT DETECTED NOT DETECTED Final    Comment: (NOTE) The Coronavirus on the Respiratory Panel, DOES NOT test for the novel  Coronavirus (2019 nCoV)    Coronavirus HKU1 NOT DETECTED NOT DETECTED Final   Coronavirus NL63 NOT DETECTED NOT DETECTED Final   Coronavirus OC43 NOT DETECTED NOT DETECTED Final   Metapneumovirus NOT DETECTED NOT DETECTED Final   Rhinovirus / Enterovirus NOT DETECTED NOT DETECTED  Final   Influenza A NOT DETECTED NOT DETECTED Final   Influenza B NOT DETECTED NOT DETECTED Final   Parainfluenza Virus 1 NOT DETECTED NOT DETECTED Final   Parainfluenza Virus 2 NOT DETECTED NOT DETECTED Final   Parainfluenza Virus 3 NOT DETECTED NOT DETECTED Final   Parainfluenza Virus 4 NOT DETECTED NOT DETECTED Final   Respiratory Syncytial Virus NOT DETECTED NOT DETECTED Final   Bordetella pertussis NOT DETECTED NOT DETECTED Final   Bordetella Parapertussis NOT DETECTED NOT DETECTED Final   Chlamydophila pneumoniae NOT DETECTED NOT DETECTED Final   Mycoplasma pneumoniae NOT DETECTED NOT DETECTED Final    Comment: Performed at [facility], [address], [place], [place] [address]  MRSA Next Gen by PCR, Nasal     Status: None   Collection Time: 12/19/23 11:55 AM   Specimen: Nasal Mucosa; Nasal Swab  Result Value Ref Range Status   MRSA by PCR Next Gen NOT DETECTED NOT DETECTED Final    Comment: (NOTE) The GeneXpert MRSA Assay (FDA approved for NASAL specimens only), is one component of a comprehensive MRSA colonization surveillance program. It is not intended to diagnose MRSA infection nor to guide or monitor treatment for MRSA infections. Test performance is not FDA approved in patients less than 51 years old. Performed at [facility], [address], [place], [place] [address]      Scheduled Meds:  apixaban   5 mg Oral BID   cyanocobalamin  2,500 mcg Oral QHS   ezetimibe  10 mg Oral Daily   insulin aspart  0-9 Units Subcutaneous TID WC   levothyroxine  150 mcg Oral Once per day on Monday Tuesday Wednesday Thursday Friday Saturday   levothyroxine  300 mcg Oral Every Sunday   metoprolol  tartrate  25 mg Oral Q6H   pantoprazole  40 mg Oral Daily   potassium chloride  20 mEq Oral Daily   rosuvastatin  20 mg Oral Daily   sodium chloride  flush  3 mL Intravenous Q12H   Continuous Infusions:  azithromycin 500 mg (12/19/23 0435)   cefTRIAXone (ROCEPHIN)  IV 2 g (12/18/23  2112)    Procedures/Studies: CT CHEST ABDOMEN PELVIS WO CONTRAST Result Date: 12/18/2023 CLINICAL DATA:  UTI, persistent fever, concerned about pyelonephritis, abscess Weakness.  Chills. EXAM: CT CHEST, ABDOMEN AND PELVIS WITHOUT CONTRAST TECHNIQUE: Multidetector CT imaging of the chest, abdomen and pelvis was performed following the standard protocol without IV contrast. RADIATION DOSE REDUCTION: This exam was performed according to the departmental dose-optimization program which includes automated exposure control, adjustment of the mA and/or kV according to patient size and/or use of iterative reconstruction technique. COMPARISON:  Chest radiograph 12/16/2023. Chest, abdomen, pelvic CT 01/07/2023 FINDINGS: CT CHEST FINDINGS Cardiovascular: The heart is upper normal in size. No significant pericardial effusion. Coronary artery and mitral annulus calcifications. Aortic atherosclerosis without aneurysm. Mediastinum/Nodes: Multiple small mediastinal lymph nodes, not enlarged by size criteria. Hilar assessment is limited in the absence of IV contrast. Mild distal esophageal wall thickening. No visible thyroid  nodule. Lungs/Pleura: Left lower lobe airspace disease standing from the fissure to the pleural surface with air bronchograms, highly suspicious for pneumonia. Surrounding ground-glass opacity throughout the left lower lobe. Trace left pleural effusion. No additional focal airspace disease. The trachea and central airways are clear. Musculoskeletal: Multiple remote right rib fractures. Thoracic spondylosis with anterior spurring. There are no acute or suspicious osseous abnormalities. CT ABDOMEN PELVIS FINDINGS Hepatobiliary: The liver is enlarged spanning 23.6 cm cranial caudal. Diffuse hepatic steatosis. No evidence of focal liver abnormality on this unenhanced exam. Clips in the gallbladder fossa postcholecystectomy. No biliary dilatation. Pancreas: No ductal dilatation or inflammation. Elongated  peripherally calcified density adjacent to the pancreatic body and tail is unchanged from prior exam. Spleen: Enlarged, 16 cm AP.  No focal abnormality. Adrenals/Urinary Tract: Left adrenal mass with macroscopic fat measuring 5.4 cm consistent with a benign adrenal myelolipoma. Normal right adrenal gland. No hydronephrosis. Mild symmetric bilateral perinephric edema. There is no obvious renal fluid collection on this unenhanced exam. Soft tissue attenuation from habitus limits detailed assessment. Small right lower pole renal cyst. No visible renal calculi. Unremarkable urinary bladder. Stomach/Bowel: Administered enteric contrast reaches the distal small bowel and colon. There is no bowel obstruction. No evidence of bowel inflammation. Postsurgical change in the stomach. The appendix is normal. Vascular/Lymphatic: Normal caliber abdominal aorta with mild atherosclerosis. Left upper quadrant collaterals are unchanged from prior exam. Reactive appearing prominent periportal nodes measuring up to 12 mm, series 2, image 68. Reproductive: Prostate is unremarkable. Other: No ascites or free air. Moderate-sized supraumbilical ventral abdominal wall hernia contains only fat. There is a diminutive fat containing umbilical hernia. Musculoskeletal: Degenerative and postsurgical change in the lumbar spine. IMPRESSION: 1. Left lower lobe airspace disease with air bronchograms, highly suspicious for pneumonia. Trace left pleural effusion. 2. Mild symmetric bilateral perinephric edema, nonspecific. No obvious renal fluid collection on this unenhanced exam. 3. Hepatosplenomegaly with hepatic steatosis. 4. Chronic calcified peripancreatic structure likely a chronically calcified pseudocyst. This is unchanged from prior imaging, and needs no specific imaging follow-up. 5. Mild distal esophageal wall thickening, can be seen with reflux. 6. Moderate-sized supraumbilical ventral abdominal wall hernia contains only fat. Aortic  Atherosclerosis (ICD10-I70.0). Electronically Signed   By: Chadwick Colonel M.D.   On: 12/18/2023 19:59   US  Venous Img Lower Bilateral (DVT) Result Date: 12/18/2023 CLINICAL DATA:  Bilateral lower extremity edema EXAM: BILATERAL LOWER EXTREMITY VENOUS DOPPLER ULTRASOUND TECHNIQUE: Gray-scale sonography with graded compression, as well as color Doppler and duplex ultrasound were performed to evaluate the lower extremity deep venous systems from the level of the common femoral vein  and including the common femoral, femoral, profunda femoral, popliteal and calf veins including the posterior tibial, peroneal and gastrocnemius veins when visible. The superficial great saphenous vein was also interrogated. Spectral Doppler was utilized to evaluate flow at rest and with distal augmentation maneuvers in the common femoral, femoral and popliteal veins. COMPARISON:  None Available. FINDINGS: RIGHT LOWER EXTREMITY Common Femoral Vein: No evidence of thrombus. Normal compressibility, respiratory phasicity and response to augmentation. Saphenofemoral Junction: No evidence of thrombus. Normal compressibility and flow on color Doppler imaging. Profunda Femoral Vein: No evidence of thrombus. Normal compressibility and flow on color Doppler imaging. Femoral Vein: No evidence of thrombus. Normal compressibility, respiratory phasicity and response to augmentation. Popliteal Vein: No evidence of thrombus. Normal compressibility, respiratory phasicity and response to augmentation. Calf Veins: No evidence of thrombus. Normal compressibility and flow on color Doppler imaging. Superficial Great Saphenous Vein: No evidence of thrombus. Normal compressibility. Venous Reflux:  None. Other Findings:  None. LEFT LOWER EXTREMITY Common Femoral Vein: No evidence of thrombus. Normal compressibility, respiratory phasicity and response to augmentation. Saphenofemoral Junction: No evidence of thrombus. Normal compressibility and flow on color  Doppler imaging. Profunda Femoral Vein: No evidence of thrombus. Normal compressibility and flow on color Doppler imaging. Femoral Vein: No evidence of thrombus. Normal compressibility, respiratory phasicity and response to augmentation. Popliteal Vein: No evidence of thrombus. Normal compressibility, respiratory phasicity and response to augmentation. Calf Veins: No evidence of thrombus. Normal compressibility and flow on color Doppler imaging. Superficial Great Saphenous Vein: No evidence of thrombus. Normal compressibility. Venous Reflux:  None. Other Findings:  None. IMPRESSION: No evidence of deep venous thrombosis in either lower extremity. Electronically Signed   By: Fernando Hoyer M.D.   On: 12/18/2023 15:47   ECHOCARDIOGRAM COMPLETE Result Date: 12/17/2023    ECHOCARDIOGRAM REPORT   Patient Name:   HAZEM KENNER Date of Exam: 12/17/2023 Medical Rec #:  [id number]        Height:       72.0 in Accession #:    [id number]       Weight:       389.6 lb Date of Birth:  1955/09/21         BSA:          2.828 m Patient Age:    67 years         BP:           142/82 mmHg Patient Gender: M                HR:           117 bpm. Exam Location:  Outpatient Procedure: 2D Echo, Color Doppler, Cardiac Doppler and Intracardiac            Opacification Agent (Both Spectral and Color Flow Doppler were            utilized during procedure). Indications:    CHF  History:        Patient has prior history of Echocardiogram examinations, most                 recent 08/17/2022. Arrythmias:Atrial Fibrillation; Risk                 Factors:Hypertension, Diabetes, Dyslipidemia and Former Smoker.  Sonographer:    Gelene Kelly RDCS Referring Phys: [phone] Shavy Beachem IMPRESSIONS  1. Left ventricular ejection fraction, by estimation, is 65 to 70%. The left ventricle has normal function. Left ventricular endocardial border not optimally defined to  evaluate regional wall motion. There is mild left ventricular hypertrophy. Left ventricular  diastolic parameters are indeterminate.  2. RV not well visualized, grossly appears normal in size and function. . Right ventricular systolic function was not well visualized. The right ventricular size is not well visualized. Tricuspid regurgitation signal is inadequate for assessing PA pressure.  3. The mitral valve was not well visualized. Trivial mitral valve regurgitation. No evidence of mitral stenosis.  4. The aortic valve was not well visualized. Aortic valve regurgitation is not visualized. No aortic stenosis is present. FINDINGS  Left Ventricle: Left ventricular ejection fraction, by estimation, is 65 to 70%. The left ventricle has normal function. Left ventricular endocardial border not optimally defined to evaluate regional wall motion. Definity contrast agent was given IV to delineate the left ventricular endocardial borders. The left ventricular internal cavity size was normal in size. There is mild left ventricular hypertrophy. Left ventricular diastolic parameters are indeterminate. Right Ventricle: RV not well visualized, grossly appears normal in size and function. The right ventricular size is not well visualized. Right vetricular wall thickness was not well visualized. Right ventricular systolic function was not well visualized.  Tricuspid regurgitation signal is inadequate for assessing PA pressure. Left Atrium: Left atrial size was normal in size. Right Atrium: Right atrial size was normal in size. Pericardium: There is no evidence of pericardial effusion. Mitral Valve: The mitral valve was not well visualized. Trivial mitral valve regurgitation. No evidence of mitral valve stenosis. Tricuspid Valve: The tricuspid valve is not well visualized. Tricuspid valve regurgitation is not demonstrated. No evidence of tricuspid stenosis. Aortic Valve: The aortic valve was not well visualized. Aortic valve regurgitation is not visualized. No aortic stenosis is present. Aortic valve mean gradient measures  7.0 mmHg. Aortic valve peak gradient measures 14.0 mmHg. Aortic valve area, by VTI measures 2.06 cm. Pulmonic Valve: The pulmonic valve was not well visualized. Pulmonic valve regurgitation is not visualized. No evidence of pulmonic stenosis. Aorta: The aortic root is normal in size and structure and the ascending aorta was not well visualized. Venous: Not well visualized. IAS/Shunts: The interatrial septum was not well visualized.  LEFT VENTRICLE PLAX 2D LVIDd:         5.80 cm   Diastology LVIDs:         3.10 cm   LV e' medial:    8.38 cm/s LV PW:         1.20 cm   LV E/e' medial:  13.5 LV IVS:        1.20 cm   LV e' lateral:   9.79 cm/s LVOT diam:     1.95 cm   LV E/e' lateral: 11.5 LV SV:         57 LV SV Index:   20 LVOT Area:     2.99 cm  RIGHT VENTRICLE RV Basal diam:  5.30 cm RV Mid diam:    3.80 cm RV S prime:     22.90 cm/s TAPSE (M-mode): 2.5 cm LEFT ATRIUM              Index        RIGHT ATRIUM           Index LA diam:        5.50 cm  1.94 cm/m   RA Area:     23.40 cm LA Vol (A2C):   56.7 ml  20.05 ml/m  RA Volume:   68.90 ml  24.36 ml/m LA Vol (A4C):   104.0  ml 36.77 ml/m LA Biplane Vol: 83.4 ml  29.49 ml/m  AORTIC VALVE AV Area (Vmax):    1.98 cm AV Area (Vmean):   2.11 cm AV Area (VTI):     2.06 cm AV Vmax:           187.11 cm/s AV Vmean:          122.803 cm/s AV VTI:            0.276 m AV Peak Grad:      14.0 mmHg AV Mean Grad:      7.0 mmHg LVOT Vmax:         124.00 cm/s LVOT Vmean:        86.600 cm/s LVOT VTI:          0.190 m LVOT/AV VTI ratio: 0.69  AORTA Ao Root diam: 2.60 cm MITRAL VALVE MV Area (PHT): 4.06 cm     SHUNTS MV Decel Time: 187 msec     Systemic VTI:  0.19 m MV E velocity: 113.00 cm/s  Systemic Diam: 1.95 cm MV A velocity: 44.80 cm/s MV E/A ratio:  2.52 Armida Lander MD Electronically signed by Armida Lander MD Signature Date/Time: 12/17/2023/12:55:56 PM    Final    DG Chest Port 1 View Result Date: 12/16/2023 EXAM: 1 VIEW XRAY OF THE CHEST 12/16/2023 09:39:00 PM  COMPARISON: 01/06/2023 CLINICAL HISTORY: Questionable sepsis - evaluate for abnormality. Patient from home BIB [facility] w/ complaints of weakness since Tuesday which he noticed after he started to come down w/ cold symptoms- reports chills, cold sweats, intermittent difficulty breathing, congestion, cough. Denies sick contacts. Usually is able to ambulate at home, but has been unable to. EMS reports patient was hypoxic on RA on arrival at 90% and placed patient on 3 L of O2 w/ improvement. Patient w/ hx of Afib and HR 80-110 for EMS. Patient febrile 103.1. hx of diabetes, hypertension. Former smoker. FINDINGS: LUNGS AND PLEURA: No focal pulmonary opacity. No pulmonary edema. No pleural effusion. No pneumothorax. HEART AND MEDIASTINUM: The heart is top normal in size. No acute abnormality of the mediastinal silhouette. BONES AND SOFT TISSUES: No acute osseous abnormality. IMPRESSION: 1. No acute findings. Electronically signed by: Zadie Herter MD 12/16/2023 09:43 PM EDT RP Workstation: [id number]    Demaris Fillers, DO  [facility]  If 7PM-7AM, please contact night-coverage [email or web address] Password [email or web address] 12/19/2023, 5:00 PM   LOS: 2 days

## 2023-12-19 NOTE — Plan of Care (Signed)
   Problem: Education: Goal: Knowledge of General Education information will improve Description Including pain rating scale, medication(s)/side effects and non-pharmacologic comfort measures Outcome: Progressing   Problem: Education: Goal: Knowledge of General Education information will improve Description Including pain rating scale, medication(s)/side effects and non-pharmacologic comfort measures Outcome: Progressing

## 2023-12-19 NOTE — Inpatient Diabetes Management (Signed)
 Inpatient Diabetes Program Recommendations  AACE/ADA: New Consensus Statement on Inpatient Glycemic Control (2015)  Target Ranges:  Prepandial:   less than 140 mg/dL      Peak postprandial:   less than 180 mg/dL (1-2 hours)      Critically ill patients:  140 - 180 mg/dL   Lab Results  Component Value Date   GLUCAP 201 (H) 12/19/2023   HGBA1C 9.1 (H) 12/17/2023    Review of Glycemic Control  Latest Reference Range & Units 12/18/23 07:43 12/18/23 11:29 12/18/23 16:19 12/18/23 21:21 12/19/23 07:25 12/19/23 11:40  Glucose-Capillary 70 - 99 mg/dL 161 (H)  Novolog 2 units 231 (H)  Novolog 3 units 185 (H)  Novolog 2 units 185 (H) 173 (H)  Novolog 2 units 201 (H)  Novolog 3 units   Diabetes history: DM 2 Outpatient Diabetes medications: Amaryl 4 mg Daily, Metformin 1000 mg bid Current orders for Inpatient glycemic control:  Novolog 0-9 units tid  A1c 9.1% on 6/1  Inpatient Diabetes Program Recommendations:    -   Consider increasing Novolog Correction scale to 0-15 units tid + hs  Spoke with pt over the phone regarding current A1c level of 9.1% and glucose control at home. Pt reports taking his Amaryl and metformin everyday and sees his PCP every 3 months to check on his Diabetes. Pt reports his last A1c was around a 8% and no changes were made at that time. Pt does not check his glucose trends at home. Pt reports diet could be improved. Reviewed glucose and A1c goals. Pt has a follow up appointment with his PCP in 1-2 weeks.  Thanks,  Eloise Hake RN, MSN, BC-ADM Inpatient Diabetes Coordinator Team Pager [phone] (8a-5p)

## 2023-12-19 NOTE — Progress Notes (Signed)
   12/19/23 0800  ReDS Vest / Clip  Station Marker D  Ruler Value 46  ReDS Value Range 36 - 40  ReDS Actual Value 36  Anatomical Comments sitting on side of bed

## 2023-12-19 NOTE — Progress Notes (Signed)
 Nurse at bedside,patient alert and oriented times four.Patient out of bed to chair this am.No c/o pain or discomfort noted at this time.Oxygen at 3 liters via nasal canula.Plan of care on going.

## 2023-12-19 NOTE — Progress Notes (Signed)
 Mobility Specialist Progress Note:    12/19/23 1028  Mobility  Activity Ambulated with assistance in hallway  Level of Assistance Standby assist, set-up cues, supervision of patient - no hands on  Assistive Device Front wheel walker  Distance Ambulated (ft) 50 ft  Range of Motion/Exercises Active;All extremities  Activity Response Tolerated well  Mobility Referral Yes  Mobility visit 1 Mobility  Mobility Specialist Start Time (ACUTE ONLY) 1000  Mobility Specialist Stop Time (ACUTE ONLY) 1028  Mobility Specialist Time Calculation (min) (ACUTE ONLY) 28 min   Pt received in chair, sister in room. Agreeable to mobility, required SBA to stand and ambulate with RW. Tolerated well, SpO2 94% on RA throughout. C/o SOB and fatigue at EOS. Left pt sitting EOB, nurse in room. All needs met.   Glinda Lapping Mobility Specialist Please contact via Special educational needs teacher or  Rehab office at [phone]

## 2023-12-20 DIAGNOSIS — R6510 Systemic inflammatory response syndrome (SIRS) of non-infectious origin without acute organ dysfunction: Secondary | ICD-10-CM

## 2023-12-20 DIAGNOSIS — I4819 Other persistent atrial fibrillation: Secondary | ICD-10-CM

## 2023-12-20 DIAGNOSIS — I5032 Chronic diastolic (congestive) heart failure: Secondary | ICD-10-CM

## 2023-12-20 LAB — CBC
HCT: 30.7 % — ABNORMAL LOW (ref 39.0–52.0)
Hemoglobin: 9.6 g/dL — ABNORMAL LOW (ref 13.0–17.0)
MCH: 24.6 pg — ABNORMAL LOW (ref 26.0–34.0)
MCHC: 31.3 g/dL (ref 30.0–36.0)
MCV: 78.5 fL — ABNORMAL LOW (ref 80.0–100.0)
Platelets: 134 10*3/uL — ABNORMAL LOW (ref 150–400)
RBC: 3.91 MIL/uL — ABNORMAL LOW (ref 4.22–5.81)
RDW: 17.1 % — ABNORMAL HIGH (ref 11.5–15.5)
WBC: 4.6 10*3/uL (ref 4.0–10.5)
nRBC: 0 % (ref 0.0–0.2)

## 2023-12-20 LAB — RENAL FUNCTION PANEL
Albumin: 2.4 g/dL — ABNORMAL LOW (ref 3.5–5.0)
Anion gap: 7 (ref 5–15)
BUN: 46 mg/dL — ABNORMAL HIGH (ref 8–23)
CO2: 22 mmol/L (ref 22–32)
Calcium: 8.1 mg/dL — ABNORMAL LOW (ref 8.9–10.3)
Chloride: 103 mmol/L (ref 98–111)
Creatinine, Ser: 2.14 mg/dL — ABNORMAL HIGH (ref 0.61–1.24)
GFR, Estimated: 33 mL/min — ABNORMAL LOW (ref >60)
Glucose, Bld: 209 mg/dL — ABNORMAL HIGH (ref 70–99)
Phosphorus: 3.6 mg/dL (ref 2.5–4.6)
Potassium: 3.6 mmol/L (ref 3.5–5.1)
Sodium: 132 mmol/L — ABNORMAL LOW (ref 135–145)

## 2023-12-20 LAB — GLUCOSE, CAPILLARY
Glucose-Capillary: 204 mg/dL — ABNORMAL HIGH (ref 70–99)
Glucose-Capillary: 259 mg/dL — ABNORMAL HIGH (ref 70–99)
Glucose-Capillary: 272 mg/dL — ABNORMAL HIGH (ref 70–99)
Glucose-Capillary: 256 mg/dL — ABNORMAL HIGH (ref 70–99)

## 2023-12-20 MED ORDER — INSULIN GLARGINE-YFGN 100 UNIT/ML ~~LOC~~ SOLN
10.0000 [IU] | Freq: Every day | SUBCUTANEOUS | Status: DC
  Administered 2023-12-20: 10 [IU] via SUBCUTANEOUS
  Filled 2023-12-20 (×3): qty 0.1

## 2023-12-20 MED ORDER — METOPROLOL TARTRATE 25 MG PO TABS
37.5000 mg | ORAL_TABLET | Freq: Four times a day (QID) | ORAL | Status: DC
  Administered 2023-12-20 – 2023-12-21 (×4): 37.5 mg via ORAL
  Filled 2023-12-20 (×4): qty 2

## 2023-12-20 NOTE — Evaluation (Signed)
 Physical Therapy Evaluation Patient Details Name: Evan Berry MRN: [id number] DOB: 1955-12-07 Today's Date: 12/20/2023  History of Present Illness  Evan Berry is a 68 year old with a history of persistent atrial fibrillation, hypertension, diabetes mellitus type 2, OSA on CPAP, hepatic steatosis, pancreatic pseudocyst, hypothyroidism, and GERD presenting with generalized weakness and shortness of breath of 1 week duration.  The patient has had some subjective fevers and chills.  He denied any chest pain, coughing, hemoptysis, abdominal pain, nausea, vomiting.  He did have 1 episode of loose stool without any hematochezia or melena.  He has had dysuria for the past 2 weeks.  He has had lower extremity edema for the better part of the last 6 months.  He quit taking his furosemide 6 months ago because he felt like it was not working.   Clinical Impression  Patient functioning near baseline for functional mobility and gait with good return for bed mobility, transfers and ambulating in room, hallway without loss of balance other than one episode of staggering and able to self recover without loss of balance. Patient encouraged to ambulate with nursing staff, mobility techs daily as tolerated for length of stay. Plan:  Patient discharged from physical therapy to care of nursing for ambulation daily as tolerated for length of stay.          If plan is discharge home, recommend the following: A little help with walking and/or transfers;Help with stairs or ramp for entrance;A little help with bathing/dressing/bathroom;Assistance with cooking/housework   Can travel by private vehicle        Equipment Recommendations None recommended by PT  Recommendations for Other Services       Functional Status Assessment Patient has had a recent decline in their functional status and demonstrates the ability to make significant improvements in function in a reasonable and predictable amount of time.      Precautions / Restrictions Precautions Precautions: Fall Recall of Precautions/Restrictions: Intact Restrictions Weight Bearing Restrictions Per Provider Order: No      Mobility  Bed Mobility Overal bed mobility: Independent                  Transfers Overall transfer level: Independent                      Ambulation/Gait Ambulation/Gait assistance: Modified independent (Device/Increase time), Supervision Gait Distance (Feet): 60 Feet Assistive device: None Gait Pattern/deviations: Decreased step length - right, Decreased step length - left, Decreased stride length Gait velocity: decreased     General Gait Details: slightly labored movement with occasional staggering left/right and able to self correct without loss of balance, on room air with SpO2 dropping from 95% to 86%  Stairs            Wheelchair Mobility     Tilt Bed    Modified Rankin (Stroke Patients Only)       Balance Overall balance assessment: Mild deficits observed, not formally tested                                           Pertinent Vitals/Pain Pain Assessment Pain Assessment: No/denies pain    Home Living Family/patient expects to be discharged to:: Private residence Living Arrangements: Spouse/significant other Available Help at Discharge: Family;Available 24 hours/day Type of Home: Mobile home Home Access: Stairs to enter Entrance Stairs-Rails: Right;Left;Can reach both  Entrance Stairs-Number of Steps: 5   Home Layout: One level Home Equipment: Agricultural consultant (2 wheels);Cane - single point      Prior Function Prior Level of Function : Independent/Modified Independent;Driving             Mobility Comments: household and short distanced community ambulation without AD ADLs Comments: Independent     Extremity/Trunk Assessment   Upper Extremity Assessment Upper Extremity Assessment: Overall WFL for tasks assessed    Lower Extremity  Assessment Lower Extremity Assessment: Overall WFL for tasks assessed    Cervical / Trunk Assessment Cervical / Trunk Assessment: Normal  Communication   Communication Communication: No apparent difficulties    Cognition Arousal: Alert Behavior During Therapy: WFL for tasks assessed/performed   PT - Cognitive impairments: No apparent impairments                         Following commands: Intact       Cueing Cueing Techniques: Verbal cues     General Comments      Exercises     Assessment/Plan    PT Assessment Patient does not need any further PT services  PT Problem List         PT Treatment Interventions      PT Goals (Current goals can be found in the Care Plan section)  Acute Rehab PT Goals Patient Stated Goal: return home with family to assist PT Goal Formulation: With patient Time For Goal Achievement: 12/20/23 Potential to Achieve Goals: Good    Frequency       Co-evaluation               AM-PAC PT "6 Clicks" Mobility  Outcome Measure Help needed turning from your back to your side while in a flat bed without using bedrails?: None Help needed moving from lying on your back to sitting on the side of a flat bed without using bedrails?: None Help needed moving to and from a bed to a chair (including a wheelchair)?: None Help needed standing up from a chair using your arms (e.g., wheelchair or bedside chair)?: None Help needed to walk in hospital room?: A Little Help needed climbing 3-5 steps with a railing? : A Little 6 Click Score: 22    End of Session   Activity Tolerance: Patient tolerated treatment well;Patient limited by fatigue Patient left: in chair;with call bell/phone within reach Nurse Communication: Mobility status PT Visit Diagnosis: Unsteadiness on feet (R26.81);Other abnormalities of gait and mobility (R26.89);Muscle weakness (generalized) (M62.81)    Time: 1610-9604 PT Time Calculation (min) (ACUTE ONLY): 23  min   Charges:   PT Evaluation $PT Eval Moderate Complexity: 1 Mod PT Treatments $Therapeutic Activity: 23-37 mins PT General Charges $$ ACUTE PT VISIT: 1 Visit         1:44 PM, 12/20/23 Walton Guppy, MPT Physical Therapist with [facility] [phone] office [phone] mobile phone

## 2023-12-20 NOTE — Progress Notes (Signed)
 Patient ID: Evan Berry, male   DOB: 11/19/1955, 68 y.o.   MRN: [id number] S: Feeling better O:BP 139/84 (BP Location: Right Arm)   Pulse 84   Temp 98 F (36.7 C) (Oral)   Resp 20   Ht 6' (1.829 m)   Wt (!) 175.4 kg   SpO2 97%   BMI 52.44 kg/m   Intake/Output Summary (Last 24 hours) at 12/20/2023 1030 Last data filed at 12/20/2023 0605 Gross per 24 hour  Intake 720 ml  Output 2550 ml  Net -1830 ml   Intake/Output: I/O last 3 completed shifts: In: 1154.6 [P.O.:960; IV Piggyback:194.6] Out: 2900 [Urine:2900]  Intake/Output this shift:  No intake/output data recorded. Weight change: -0.8 kg Gen: NAD CVS: IRR IRR Resp: CTA Abd: +BS, obese, soft, NT Ext: 1+ pretibial edema  Recent Labs  Lab 12/16/23 2147 12/17/23 0737 12/18/23 0319 12/19/23 0520 12/20/23 0446  NA 133* 133* 133* 130* 132*  K 4.2 3.8 3.4* 3.3* 3.6  CL 99 100 103 99 103  CO2 21* 21* 21* 22 22  GLUCOSE 228* 173* 199* 167* 209*  BUN 42* 42* 42* 48* 46*  CREATININE 1.89* 1.92* 2.28* 2.44* 2.14*  ALBUMIN 3.1* 2.9*  --   --  2.4*  CALCIUM 8.5* 8.0* 8.0* 8.0* 8.1*  PHOS  --  3.0  --   --  3.6  AST 55* 56*  --   --   --   ALT 33 34  --   --   --    Liver Function Tests: Recent Labs  Lab 12/16/23 2147 12/17/23 0737 12/20/23 0446  AST 55* 56*  --   ALT 33 34  --   ALKPHOS 43 39  --   BILITOT 0.8 0.7  --   PROT 6.8 6.4*  --   ALBUMIN 3.1* 2.9* 2.4*   No results for input(s): "LIPASE", "AMYLASE" in the last 168 hours. No results for input(s): "AMMONIA" in the last 168 hours. CBC: Recent Labs  Lab 12/16/23 2147 12/17/23 0737 12/20/23 0446  WBC 7.4 7.0 4.6  NEUTROABS 6.5  --   --   HGB 11.0* 9.8* 9.6*  HCT 34.4* 31.9* 30.7*  MCV 78.5* 79.0* 78.5*  PLT 145* 137* 134*   Cardiac Enzymes: No results for input(s): "CKTOTAL", "CKMB", "CKMBINDEX", "TROPONINI" in the last 168 hours. CBG: Recent Labs  Lab 12/19/23 0725 12/19/23 1140 12/19/23 1615 12/19/23 1957 12/20/23 0723  GLUCAP 173* 201*  221* 208* 204*    Iron Studies:  Recent Labs    12/18/23 0319  IRON 10*  TIBC 261  FERRITIN 439*   Studies/Results: CT CHEST ABDOMEN PELVIS WO CONTRAST Result Date: 12/18/2023 CLINICAL DATA:  UTI, persistent fever, concerned about pyelonephritis, abscess Weakness.  Chills. EXAM: CT CHEST, ABDOMEN AND PELVIS WITHOUT CONTRAST TECHNIQUE: Multidetector CT imaging of the chest, abdomen and pelvis was performed following the standard protocol without IV contrast. RADIATION DOSE REDUCTION: This exam was performed according to the departmental dose-optimization program which includes automated exposure control, adjustment of the mA and/or kV according to patient size and/or use of iterative reconstruction technique. COMPARISON:  Chest radiograph 12/16/2023. Chest, abdomen, pelvic CT 01/07/2023 FINDINGS: CT CHEST FINDINGS Cardiovascular: The heart is upper normal in size. No significant pericardial effusion. Coronary artery and mitral annulus calcifications. Aortic atherosclerosis without aneurysm. Mediastinum/Nodes: Multiple small mediastinal lymph nodes, not enlarged by size criteria. Hilar assessment is limited in the absence of IV contrast. Mild distal esophageal wall thickening. No visible thyroid  nodule. Lungs/Pleura: Left lower  lobe airspace disease standing from the fissure to the pleural surface with air bronchograms, highly suspicious for pneumonia. Surrounding ground-glass opacity throughout the left lower lobe. Trace left pleural effusion. No additional focal airspace disease. The trachea and central airways are clear. Musculoskeletal: Multiple remote right rib fractures. Thoracic spondylosis with anterior spurring. There are no acute or suspicious osseous abnormalities. CT ABDOMEN PELVIS FINDINGS Hepatobiliary: The liver is enlarged spanning 23.6 cm cranial caudal. Diffuse hepatic steatosis. No evidence of focal liver abnormality on this unenhanced exam. Clips in the gallbladder fossa  postcholecystectomy. No biliary dilatation. Pancreas: No ductal dilatation or inflammation. Elongated peripherally calcified density adjacent to the pancreatic body and tail is unchanged from prior exam. Spleen: Enlarged, 16 cm AP.  No focal abnormality. Adrenals/Urinary Tract: Left adrenal mass with macroscopic fat measuring 5.4 cm consistent with a benign adrenal myelolipoma. Normal right adrenal gland. No hydronephrosis. Mild symmetric bilateral perinephric edema. There is no obvious renal fluid collection on this unenhanced exam. Soft tissue attenuation from habitus limits detailed assessment. Small right lower pole renal cyst. No visible renal calculi. Unremarkable urinary bladder. Stomach/Bowel: Administered enteric contrast reaches the distal small bowel and colon. There is no bowel obstruction. No evidence of bowel inflammation. Postsurgical change in the stomach. The appendix is normal. Vascular/Lymphatic: Normal caliber abdominal aorta with mild atherosclerosis. Left upper quadrant collaterals are unchanged from prior exam. Reactive appearing prominent periportal nodes measuring up to 12 mm, series 2, image 68. Reproductive: Prostate is unremarkable. Other: No ascites or free air. Moderate-sized supraumbilical ventral abdominal wall hernia contains only fat. There is a diminutive fat containing umbilical hernia. Musculoskeletal: Degenerative and postsurgical change in the lumbar spine. IMPRESSION: 1. Left lower lobe airspace disease with air bronchograms, highly suspicious for pneumonia. Trace left pleural effusion. 2. Mild symmetric bilateral perinephric edema, nonspecific. No obvious renal fluid collection on this unenhanced exam. 3. Hepatosplenomegaly with hepatic steatosis. 4. Chronic calcified peripancreatic structure likely a chronically calcified pseudocyst. This is unchanged from prior imaging, and needs no specific imaging follow-up. 5. Mild distal esophageal wall thickening, can be seen with  reflux. 6. Moderate-sized supraumbilical ventral abdominal wall hernia contains only fat. Aortic Atherosclerosis (ICD10-I70.0). Electronically Signed   By: Chadwick Colonel M.D.   On: 12/18/2023 19:59   US  Venous Img Lower Bilateral (DVT) Result Date: 12/18/2023 CLINICAL DATA:  Bilateral lower extremity edema EXAM: BILATERAL LOWER EXTREMITY VENOUS DOPPLER ULTRASOUND TECHNIQUE: Gray-scale sonography with graded compression, as well as color Doppler and duplex ultrasound were performed to evaluate the lower extremity deep venous systems from the level of the common femoral vein and including the common femoral, femoral, profunda femoral, popliteal and calf veins including the posterior tibial, peroneal and gastrocnemius veins when visible. The superficial great saphenous vein was also interrogated. Spectral Doppler was utilized to evaluate flow at rest and with distal augmentation maneuvers in the common femoral, femoral and popliteal veins. COMPARISON:  None Available. FINDINGS: RIGHT LOWER EXTREMITY Common Femoral Vein: No evidence of thrombus. Normal compressibility, respiratory phasicity and response to augmentation. Saphenofemoral Junction: No evidence of thrombus. Normal compressibility and flow on color Doppler imaging. Profunda Femoral Vein: No evidence of thrombus. Normal compressibility and flow on color Doppler imaging. Femoral Vein: No evidence of thrombus. Normal compressibility, respiratory phasicity and response to augmentation. Popliteal Vein: No evidence of thrombus. Normal compressibility, respiratory phasicity and response to augmentation. Calf Veins: No evidence of thrombus. Normal compressibility and flow on color Doppler imaging. Superficial Great Saphenous Vein: No evidence of thrombus. Normal compressibility. Venous  Reflux:  None. Other Findings:  None. LEFT LOWER EXTREMITY Common Femoral Vein: No evidence of thrombus. Normal compressibility, respiratory phasicity and response to  augmentation. Saphenofemoral Junction: No evidence of thrombus. Normal compressibility and flow on color Doppler imaging. Profunda Femoral Vein: No evidence of thrombus. Normal compressibility and flow on color Doppler imaging. Femoral Vein: No evidence of thrombus. Normal compressibility, respiratory phasicity and response to augmentation. Popliteal Vein: No evidence of thrombus. Normal compressibility, respiratory phasicity and response to augmentation. Calf Veins: No evidence of thrombus. Normal compressibility and flow on color Doppler imaging. Superficial Great Saphenous Vein: No evidence of thrombus. Normal compressibility. Venous Reflux:  None. Other Findings:  None. IMPRESSION: No evidence of deep venous thrombosis in either lower extremity. Electronically Signed   By: Fernando Hoyer M.D.   On: 12/18/2023 15:47    apixaban   5 mg Oral BID   cyanocobalamin  2,500 mcg Oral QHS   ezetimibe  10 mg Oral Daily   insulin aspart  0-9 Units Subcutaneous TID WC   levothyroxine  150 mcg Oral Once per day on Monday Tuesday Wednesday Thursday Friday Saturday   levothyroxine  300 mcg Oral Every Sunday   metoprolol  tartrate  37.5 mg Oral Q6H   pantoprazole  40 mg Oral Daily   potassium chloride  20 mEq Oral Daily   rosuvastatin  20 mg Oral Daily   sodium chloride  flush  3 mL Intravenous Q12H    BMET    Component Value Date/Time   NA 132 (L) 12/20/2023 0446   K 3.6 12/20/2023 0446   CL 103 12/20/2023 0446   CO2 22 12/20/2023 0446   GLUCOSE 209 (H) 12/20/2023 0446   BUN 46 (H) 12/20/2023 0446   CREATININE 2.14 (H) 12/20/2023 0446   CALCIUM 8.1 (L) 12/20/2023 0446   GFRNONAA 33 (L) 12/20/2023 0446   GFRAA >60 01/21/2020 0859   CBC    Component Value Date/Time   WBC 4.6 12/20/2023 0446   RBC 3.91 (L) 12/20/2023 0446   HGB 9.6 (L) 12/20/2023 0446   HCT 30.7 (L) 12/20/2023 0446   PLT 134 (L) 12/20/2023 0446   MCV 78.5 (L) 12/20/2023 0446   MCH 24.6 (L) 12/20/2023 0446   MCHC 31.3  12/20/2023 0446   RDW 17.1 (H) 12/20/2023 0446   LYMPHSABS 0.3 (L) 12/16/2023 2147   MONOABS 0.6 12/16/2023 2147   EOSABS 0.0 12/16/2023 2147   BASOSABS 0.0 12/16/2023 2147      Assessment/Plan:  AKI/CKD stage III - presumably ischemic ATN in the setting of sepsis/hypotension, volume depletion, and concomitant ACE inhibition.  No hydronephrosis on CT scan.  Scr and UOP has improved after IVF's and improvement of Bp.  No indication for dialysis at this time.  Will continue to follow closely.  Continue to hold metformin and ramipril.    Avoid nephrotoxic medications including NSAIDs and iodinated intravenous contrast exposure unless the latter is absolutely indicated.   Preferred narcotic agents for pain control are hydromorphone, fentanyl, and methadone. Morphine should not be used.  Avoid Baclofen and avoid oral sodium phosphate and magnesium citrate based laxatives / bowel preps.  Continue strict Input and Output monitoring. Will monitor the patient closely with you and intervene or adjust therapy as indicated by changes in clinical status/labs  Sepsis - possibly due to pneumonia as seen on CT scan but also possible UTI per primary svc.  Urine culture with multiple organisms.  Continue with ceftriaxone for now. Acute hypoxic respiratory failure - LLL airspace disease on  CT scan.  Continue with supplemental oxygen and abx. HTN - bp on low side and holding amlodipine and ramipril for now. Acute diastolic CHF - reports edema worsening over the last month.  ECHO with preserved EF but unable to evaluate LV diastolic parameters.  Hold off on diuresis for now given AKI.  Possible component of proteinuria as well.  Will check UPC, SPEP, and UPEP. OSA continue CPAP at bedtime. Hypervolemic hyponatremia - asymptomatic.  Follow for now. Iron deficiency anemia - TSAT 4%.  Hgb dropped to 9.8 and has history of GIB.  Continue to follow and transfuse for Hgb <7 Atrial fibrillation - persistent.  Rate better  controlled at this time. Cardiology following.   Benjamin Brands, MD [facility]

## 2023-12-20 NOTE — Inpatient Diabetes Management (Signed)
 Inpatient Diabetes Program Recommendations  AACE/ADA: New Consensus Statement on Inpatient Glycemic Control (2015)  Target Ranges:  Prepandial:   less than 140 mg/dL      Peak postprandial:   less than 180 mg/dL (1-2 hours)      Critically ill patients:  140 - 180 mg/dL   Lab Results  Component Value Date   GLUCAP 204 (H) 12/20/2023   HGBA1C 9.1 (H) 12/17/2023    Review of Glycemic Control  Latest Reference Range & Units 12/18/23 07:43 12/18/23 11:29 12/18/23 16:19 12/18/23 21:21 12/19/23 07:25 12/19/23 11:40  Glucose-Capillary 70 - 99 mg/dL 409 (H)  Novolog 2 units 231 (H)  Novolog 3 units 185 (H)  Novolog 2 units 185 (H) 173 (H)  Novolog 2 units 201 (H)  Novolog 3 units    Latest Reference Range & Units 12/19/23 07:25 12/19/23 11:40 12/19/23 16:15 12/19/23 19:57 12/20/23 07:23  Glucose-Capillary 70 - 99 mg/dL 811 (H) 914 (H) 782 (H) 208 (H) 204 (H)   Diabetes history: DM 2 Outpatient Diabetes medications: Amaryl 4 mg Daily, Metformin 1000 mg bid Current orders for Inpatient glycemic control:  Novolog 0-9 units tid  A1c 9.1% on 6/1  Inpatient Diabetes Program Recommendations:    -   Consider increasing Novolog Correction scale to 0-15 units tid + hs  Spoke with pt yesterday 6/3.  Thanks,  Eloise Hake RN, MSN, BC-ADM Inpatient Diabetes Coordinator Team Pager [phone] (8a-5p)

## 2023-12-20 NOTE — Progress Notes (Signed)
 PROGRESS NOTE  Evan Berry:[id number] DOB: 11/20/1955 DOA: 12/16/2023 PCP: Aldo Hun, MD  Brief History:  68 year old with a history of persistent atrial fibrillation, hypertension, diabetes mellitus type 2, OSA on CPAP, hepatic steatosis, pancreatic pseudocyst, hypothyroidism, and GERD presenting with generalized weakness and shortness of breath of 1 week duration.  The patient has had some subjective fevers and chills.  He denied any chest pain, coughing, hemoptysis, abdominal pain, nausea, vomiting.  He did have 1 episode of loose stool without any hematochezia or melena.  He has had dysuria for the past 2 weeks.  He has had lower extremity edema for the better part of the last 6 months.  He quit taking his furosemide 6 months ago because he felt like it was not working. In the ED, the patient was febrile up to 103.1 F.  He was tachycardic in 120-130.  He was hemodynamically stable.  Oxygen saturation was 90% on room air.  He was placed on 2 L with saturation up to 95%.  Lactic acid 2.0>> 1.4. The patient was started on ceftriaxone.  He was given 3 L of fluid.    Subjective: The patient was seen and examined this morning, stable looking distress. Reporting improved shortness of breath denies any chest pain     Assessment/Plan:  Sepsis - Secondary to UTI and pneumonia -Improved sepsis physiology, S/P IV fluids, discontinued - Presented with tachycardia and fever - Lactic acid 2.0>> 1.4 - Blood cultures no growth to date -Urine culture multiple species present -Respiratory panel negative - Continue ceftriaxone/azithro    Acute diastolic CHF - Started IV furosemide>>stop 6/2 due to uptrending serum creatinine - Accurate I's and O's--incomplete  Intake/Output Summary (Last 24 hours) at 12/20/2023 1204 Last data filed at 12/20/2023 0605 Gross per 24 hour  Intake 720 ml  Output 2550 ml  Net -1830 ml       - Daily weights 393>> 385 - 12/17/23 Echo--EF 65 to  70%, normal RVF, trivial MR -ReDS = 38>>36   Acute respiratory failure with hypoxia - Improved on 2 L of oxygen satting 97%  - Initially felt to be pulmonary edema and fluid overload, but now more likely multifactorial including pneumonia and OSA/OHS, deconditioning - Stable on 2-3 L - Wean oxygen as tolerated for saturation over 90% - COVID/Flu/RSV--neg - check viral resp panel--neg - VBG--7.3 6/39/36/22 -6/2 CT chest--LLL airsp disease with air bronchograms, trace L-effusion   AKI on CKD 3b -baseline creatinine 1.1-1.2--June 2024 - likely has progression of underlying CKD - new baseline 1.8-1.9 - serum creatinine up to 2.44>>.  2.14 Holding diuretics Lasix - appreciate nephrology  Lobar Pneumonia -6/2 CT chest--LLL airsp disease with air bronchograms, trace L-effusion -MRSA neg -continue ceftriaxone and azithro   Pyelonephritis - UA >50 WBC - Continue ceftriaxone pending culture data - Culture unrevealing, but was collected after antibiotics had been initiated -Continue empiric ceftriaxone  - 6/2 CT abd/pelvis--mild symmetric perinephric edema without fluid collection   Essential hypertension - Blood pressures running soft -Holding amlodipine and ramipril    Persistent atrial fibrillation with RVR - Continue apixaban  - Changed to metoprolol  25 mg every 6 hours - TSH--0.61   Uncontrolled diabetes mellitus type 2 with hyperglycemia - 12/17/2023 Hemoglobin A1c--9.1 Long-acting insulin Semglee 10 units  - NovoLog sliding scale   OSA - Continue CPAP   Morbid obesity - BMI 53.43 - Modification   Mixed hyperlipidemia - Continue statin  Family Communication:  spouse on 6/3   Consultants:  cards   Code Status:  FULL    DVT Prophylaxis:  apixaban      Procedures: As Listed in Progress Note Above   Antibiotics: Ceftriaxone 5/31>> Azithro 6/1>>        Objective: Vitals:   12/19/23 1950 12/20/23 0111 12/20/23 0518 12/20/23  0805  BP: (!) 146/85 (!) 158/99 131/81 139/84  Pulse: 90 86 83 84  Resp: 20  20   Temp: 98 F (36.7 C)  98.6 F (37 C) 98 F (36.7 C)  TempSrc: Oral  Oral Oral  SpO2: 99% 99% 99% 97%  Weight:   (!) 175.4 kg   Height:        Intake/Output Summary (Last 24 hours) at 12/20/2023 1201 Last data filed at 12/20/2023 1191 Gross per 24 hour  Intake 720 ml  Output 2550 ml  Net -1830 ml   Weight change: -0.8 kg Exam:       General:  AAO x 3,  cooperative, no distress;   HEENT:  Normocephalic, PERRL, otherwise with in Normal limits   Neuro:  CNII-XII intact. , normal motor and sensation, reflexes intact   Lungs:   Clear to auscultation BL, Respirations unlabored,  No wheezes / crackles  Cardio:    S1/S2, RRR, No murmure, No Rubs or Gallops   Abdomen:  Soft, non-tender, bowel sounds active all four quadrants, no guarding or peritoneal signs.  Muscular  skeletal:  Limited exam -global generalized weaknesses - in bed, able to move all 4 extremities,   2+ pulses,  symmetric, ++2  pitting edema  Skin:  Dry, warm to touch, negative for any Rashes,  Wounds: Please see nursing documentation          Rewed: I have personally reviewed following labs and imaging studies Basic Metabolic Panel: Recent Labs  Lab 12/16/23 2147 12/17/23 0737 12/18/23 0319 12/19/23 0520 12/20/23 0446  NA 133* 133* 133* 130* 132*  K 4.2 3.8 3.4* 3.3* 3.6  CL 99 100 103 99 103  CO2 21* 21* 21* 22 22  GLUCOSE 228* 173* 199* 167* 209*  BUN 42* 42* 42* 48* 46*  CREATININE 1.89* 1.92* 2.28* 2.44* 2.14*  CALCIUM 8.5* 8.0* 8.0* 8.0* 8.1*  MG  --  1.8 1.8 1.9  --   PHOS  --  3.0  --   --  3.6   Liver Function Tests: Recent Labs  Lab 12/16/23 2147 12/17/23 0737 12/20/23 0446  AST 55* 56*  --   ALT 33 34  --   ALKPHOS 43 39  --   BILITOT 0.8 0.7  --   PROT 6.8 6.4*  --   ALBUMIN 3.1* 2.9* 2.4*   No results for input(s): "LIPASE", "AMYLASE" in the last 168 hours. No results for input(s):  "AMMONIA" in the last 168 hours. Coagulation Profile: Recent Labs  Lab 12/16/23 2147  INR 1.7*   CBC: Recent Labs  Lab 12/16/23 2147 12/17/23 0737 12/20/23 0446  WBC 7.4 7.0 4.6  NEUTROABS 6.5  --   --   HGB 11.0* 9.8* 9.6*  HCT 34.4* 31.9* 30.7*  MCV 78.5* 79.0* 78.5*  PLT 145* 137* 134*   Cardiac Enzymes: No results for input(s): "CKTOTAL", "CKMB", "CKMBINDEX", "TROPONINI" in the last 168 hours. BNP: Invalid input(s): "POCBNP" CBG: Recent Labs  Lab 12/19/23 1140 12/19/23 1615 12/19/23 1957 12/20/23 0723 12/20/23 1059  GLUCAP 201* 221* 208* 204* 259*   HbA1C: No results for input(s): "HGBA1C" in the  last 72 hours.  Urine analysis:    Component Value Date/Time   COLORURINE AMBER (A) 12/17/2023 0250   APPEARANCEUR CLOUDY (A) 12/17/2023 0250   LABSPEC 1.021 12/17/2023 0250   PHURINE 5.0 12/17/2023 0250   GLUCOSEU 50 (A) 12/17/2023 0250   HGBUR LARGE (A) 12/17/2023 0250   BILIRUBINUR NEGATIVE 12/17/2023 0250   KETONESUR NEGATIVE 12/17/2023 0250   PROTEINUR 100 (A) 12/17/2023 0250   NITRITE NEGATIVE 12/17/2023 0250   LEUKOCYTESUR TRACE (A) 12/17/2023 0250   Sepsis Labs: @LABRCNTIP (procalcitonin:4,lacticidven:4) ) Recent Results (from the past 240 hours)  Blood Culture (routine x 2)     Status: None (Preliminary result)   Collection Time: 12/16/23  9:45 PM   Specimen: Left Antecubital; Blood  Result Value Ref Range Status   Specimen Description LEFT ANTECUBITAL  Final   Special Requests   Final    BOTTLES DRAWN AEROBIC AND ANAEROBIC Blood Culture adequate volume   Culture   Final    NO GROWTH 4 DAYS Performed at [facility], [address] [place], [place] [address]    Report Status PENDING  Incomplete  Resp panel by RT-PCR (RSV, Flu A&B, Covid) Anterior Nasal Swab     Status: None   Collection Time: 12/16/23  9:47 PM   Specimen: Anterior Nasal Swab  Result Value Ref Range Status   SARS Coronavirus 2 by RT PCR NEGATIVE NEGATIVE Final    Comment:  (NOTE) SARS-CoV-2 target nucleic acids are NOT DETECTED.  The SARS-CoV-2 RNA is generally detectable in upper respiratory specimens during the acute phase of infection. The lowest concentration of SARS-CoV-2 viral copies this assay can detect is 138 copies/mL. A negative result does not preclude SARS-Cov-2 infection and should not be used as the sole basis for treatment or other patient management decisions. A negative result may occur with  improper specimen collection/handling, submission of specimen other than nasopharyngeal swab, presence of viral mutation(s) within the areas targeted by this assay, and inadequate number of viral copies(<138 copies/mL). A negative result must be combined with clinical observations, patient history, and epidemiological information. The expected result is Negative.  Fact Sheet for Patients:  BloggerCourse.com  Fact Sheet for Healthcare Providers:  SeriousBroker.it  This test is no t yet approved or cleared by the [place]  FDA and  has been authorized for detection and/or diagnosis of SARS-CoV-2 by FDA under an Emergency Use Authorization (EUA). This EUA will remain  in effect (meaning this test can be used) for the duration of the COVID-19 declaration under Section 564(b)(1) of the Act, 21 U.S.C.section 360bbb-3(b)(1), unless the authorization is terminated  or revoked sooner.       Influenza A by PCR NEGATIVE NEGATIVE Final   Influenza B by PCR NEGATIVE NEGATIVE Final    Comment: (NOTE) The Xpert Xpress SARS-CoV-2/FLU/RSV plus assay is intended as an aid in the diagnosis of influenza from Nasopharyngeal swab specimens and should not be used as a sole basis for treatment. Nasal washings and aspirates are unacceptable for Xpert Xpress SARS-CoV-2/FLU/RSV testing.  Fact Sheet for Patients: BloggerCourse.com  Fact Sheet for Healthcare  Providers: SeriousBroker.it  This test is not yet approved or cleared by the [place]  FDA and has been authorized for detection and/or diagnosis of SARS-CoV-2 by FDA under an Emergency Use Authorization (EUA). This EUA will remain in effect (meaning this test can be used) for the duration of the COVID-19 declaration under Section 564(b)(1) of the Act, 21 U.S.C. section 360bbb-3(b)(1), unless the authorization is terminated or revoked.  Resp Syncytial Virus by PCR NEGATIVE NEGATIVE Final    Comment: (NOTE) Fact Sheet for Patients: BloggerCourse.com  Fact Sheet for Healthcare Providers: SeriousBroker.it  This test is not yet approved or cleared by the [place]  FDA and has been authorized for detection and/or diagnosis of SARS-CoV-2 by FDA under an Emergency Use Authorization (EUA). This EUA will remain in effect (meaning this test can be used) for the duration of the COVID-19 declaration under Section 564(b)(1) of the Act, 21 U.S.C. section 360bbb-3(b)(1), unless the authorization is terminated or revoked.  Performed at [facility], [address], [place], [place] [address]   Blood Culture (routine x 2)     Status: None (Preliminary result)   Collection Time: 12/16/23 11:28 PM   Specimen: BLOOD LEFT FOREARM  Result Value Ref Range Status   Specimen Description BLOOD LEFT FOREARM  Final   Special Requests   Final    BOTTLES DRAWN AEROBIC AND ANAEROBIC Blood Culture adequate volume   Culture   Final    NO GROWTH 4 DAYS Performed at [facility], [address], [place], [place] [address]    Report Status PENDING  Incomplete  Urine Culture     Status: Abnormal   Collection Time: 12/17/23  2:50 AM   Specimen: Urine, Random  Result Value Ref Range Status   Specimen Description   Final    URINE, RANDOM Performed at [facility], [address] [place], [place] [address]    Special Requests    Final    NONE Reflexed from [id number] Performed at [facility] [address] [place], [place] [address]    Culture MULTIPLE SPECIES PRESENT, SUGGEST RECOLLECTION (A)  Final   Report Status 12/18/2023 FINAL  Final  Respiratory (~20 pathogens) panel by PCR     Status: None   Collection Time: 12/17/23  9:12 AM   Specimen: Nasopharyngeal Swab; Respiratory  Result Value Ref Range Status   Adenovirus NOT DETECTED NOT DETECTED Final   Coronavirus 229E NOT DETECTED NOT DETECTED Final    Comment: (NOTE) The Coronavirus on the Respiratory Panel, DOES NOT test for the novel  Coronavirus (2019 nCoV)    Coronavirus HKU1 NOT DETECTED NOT DETECTED Final   Coronavirus NL63 NOT DETECTED NOT DETECTED Final   Coronavirus OC43 NOT DETECTED NOT DETECTED Final   Metapneumovirus NOT DETECTED NOT DETECTED Final   Rhinovirus / Enterovirus NOT DETECTED NOT DETECTED Final   Influenza A NOT DETECTED NOT DETECTED Final   Influenza B NOT DETECTED NOT DETECTED Final   Parainfluenza Virus 1 NOT DETECTED NOT DETECTED Final   Parainfluenza Virus 2 NOT DETECTED NOT DETECTED Final   Parainfluenza Virus 3 NOT DETECTED NOT DETECTED Final   Parainfluenza Virus 4 NOT DETECTED NOT DETECTED Final   Respiratory Syncytial Virus NOT DETECTED NOT DETECTED Final   Bordetella pertussis NOT DETECTED NOT DETECTED Final   Bordetella Parapertussis NOT DETECTED NOT DETECTED Final   Chlamydophila pneumoniae NOT DETECTED NOT DETECTED Final   Mycoplasma pneumoniae NOT DETECTED NOT DETECTED Final    Comment: Performed at [facility], [address] [place], [place] [address]  MRSA Next Gen by PCR, Nasal     Status: None   Collection Time: 12/19/23 11:55 AM   Specimen: Nasal Mucosa; Nasal Swab  Result Value Ref Range Status   MRSA by PCR Next Gen NOT DETECTED NOT DETECTED Final    Comment: (NOTE) The GeneXpert MRSA Assay (FDA approved for NASAL specimens only), is one component of a  comprehensive MRSA colonization  surveillance program. It is not intended to diagnose MRSA infection nor to guide or monitor treatment for MRSA infections. Test performance is not FDA approved in patients less than 70 years old. Performed at [facility], [address] [place], [place] [address]      Scheduled Meds:  apixaban   5 mg Oral BID   cyanocobalamin  2,500 mcg Oral QHS   ezetimibe  10 mg Oral Daily   insulin aspart  0-9 Units Subcutaneous TID WC   insulin glargine-yfgn  10 Units Subcutaneous Daily   levothyroxine  150 mcg Oral Once per day on Monday Tuesday Wednesday Thursday Friday Saturday   levothyroxine  300 mcg Oral Every Sunday   metoprolol  tartrate  37.5 mg Oral Q6H   pantoprazole  40 mg Oral Daily   potassium chloride  20 mEq Oral Daily   rosuvastatin  20 mg Oral Daily   sodium chloride  flush  3 mL Intravenous Q12H   Continuous Infusions:  azithromycin 500 mg (12/20/23 0558)   cefTRIAXone (ROCEPHIN)  IV 2 g (12/19/23 2019)    Procedures/Studies: CT CHEST ABDOMEN PELVIS WO CONTRAST Result Date: 12/18/2023 CLINICAL DATA:  UTI, persistent fever, concerned about pyelonephritis, abscess Weakness.  Chills. EXAM: CT CHEST, ABDOMEN AND PELVIS WITHOUT CONTRAST TECHNIQUE: Multidetector CT imaging of the chest, abdomen and pelvis was performed following the standard protocol without IV contrast. RADIATION DOSE REDUCTION: This exam was performed according to the departmental dose-optimization program which includes automated exposure control, adjustment of the mA and/or kV according to patient size and/or use of iterative reconstruction technique. COMPARISON:  Chest radiograph 12/16/2023. Chest, abdomen, pelvic CT 01/07/2023 FINDINGS: CT CHEST FINDINGS Cardiovascular: The heart is upper normal in size. No significant pericardial effusion. Coronary artery and mitral annulus calcifications. Aortic atherosclerosis without aneurysm. Mediastinum/Nodes: Multiple small mediastinal lymph nodes, not enlarged by size  criteria. Hilar assessment is limited in the absence of IV contrast. Mild distal esophageal wall thickening. No visible thyroid  nodule. Lungs/Pleura: Left lower lobe airspace disease standing from the fissure to the pleural surface with air bronchograms, highly suspicious for pneumonia. Surrounding ground-glass opacity throughout the left lower lobe. Trace left pleural effusion. No additional focal airspace disease. The trachea and central airways are clear. Musculoskeletal: Multiple remote right rib fractures. Thoracic spondylosis with anterior spurring. There are no acute or suspicious osseous abnormalities. CT ABDOMEN PELVIS FINDINGS Hepatobiliary: The liver is enlarged spanning 23.6 cm cranial caudal. Diffuse hepatic steatosis. No evidence of focal liver abnormality on this unenhanced exam. Clips in the gallbladder fossa postcholecystectomy. No biliary dilatation. Pancreas: No ductal dilatation or inflammation. Elongated peripherally calcified density adjacent to the pancreatic body and tail is unchanged from prior exam. Spleen: Enlarged, 16 cm AP.  No focal abnormality. Adrenals/Urinary Tract: Left adrenal mass with macroscopic fat measuring 5.4 cm consistent with a benign adrenal myelolipoma. Normal right adrenal gland. No hydronephrosis. Mild symmetric bilateral perinephric edema. There is no obvious renal fluid collection on this unenhanced exam. Soft tissue attenuation from habitus limits detailed assessment. Small right lower pole renal cyst. No visible renal calculi. Unremarkable urinary bladder. Stomach/Bowel: Administered enteric contrast reaches the distal small bowel and colon. There is no bowel obstruction. No evidence of bowel inflammation. Postsurgical change in the stomach. The appendix is normal. Vascular/Lymphatic: Normal caliber abdominal aorta with mild atherosclerosis. Left upper quadrant collaterals are unchanged from prior exam. Reactive appearing prominent periportal nodes measuring up to  12 mm, series 2, image 68. Reproductive: Prostate is unremarkable. Other: No ascites  or free air. Moderate-sized supraumbilical ventral abdominal wall hernia contains only fat. There is a diminutive fat containing umbilical hernia. Musculoskeletal: Degenerative and postsurgical change in the lumbar spine. IMPRESSION: 1. Left lower lobe airspace disease with air bronchograms, highly suspicious for pneumonia. Trace left pleural effusion. 2. Mild symmetric bilateral perinephric edema, nonspecific. No obvious renal fluid collection on this unenhanced exam. 3. Hepatosplenomegaly with hepatic steatosis. 4. Chronic calcified peripancreatic structure likely a chronically calcified pseudocyst. This is unchanged from prior imaging, and needs no specific imaging follow-up. 5. Mild distal esophageal wall thickening, can be seen with reflux. 6. Moderate-sized supraumbilical ventral abdominal wall hernia contains only fat. Aortic Atherosclerosis (ICD10-I70.0). Electronically Signed   By: Chadwick Colonel M.D.   On: 12/18/2023 19:59   US  Venous Img Lower Bilateral (DVT) Result Date: 12/18/2023 CLINICAL DATA:  Bilateral lower extremity edema EXAM: BILATERAL LOWER EXTREMITY VENOUS DOPPLER ULTRASOUND TECHNIQUE: Gray-scale sonography with graded compression, as well as color Doppler and duplex ultrasound were performed to evaluate the lower extremity deep venous systems from the level of the common femoral vein and including the common femoral, femoral, profunda femoral, popliteal and calf veins including the posterior tibial, peroneal and gastrocnemius veins when visible. The superficial great saphenous vein was also interrogated. Spectral Doppler was utilized to evaluate flow at rest and with distal augmentation maneuvers in the common femoral, femoral and popliteal veins. COMPARISON:  None Available. FINDINGS: RIGHT LOWER EXTREMITY Common Femoral Vein: No evidence of thrombus. Normal compressibility, respiratory phasicity and  response to augmentation. Saphenofemoral Junction: No evidence of thrombus. Normal compressibility and flow on color Doppler imaging. Profunda Femoral Vein: No evidence of thrombus. Normal compressibility and flow on color Doppler imaging. Femoral Vein: No evidence of thrombus. Normal compressibility, respiratory phasicity and response to augmentation. Popliteal Vein: No evidence of thrombus. Normal compressibility, respiratory phasicity and response to augmentation. Calf Veins: No evidence of thrombus. Normal compressibility and flow on color Doppler imaging. Superficial Great Saphenous Vein: No evidence of thrombus. Normal compressibility. Venous Reflux:  None. Other Findings:  None. LEFT LOWER EXTREMITY Common Femoral Vein: No evidence of thrombus. Normal compressibility, respiratory phasicity and response to augmentation. Saphenofemoral Junction: No evidence of thrombus. Normal compressibility and flow on color Doppler imaging. Profunda Femoral Vein: No evidence of thrombus. Normal compressibility and flow on color Doppler imaging. Femoral Vein: No evidence of thrombus. Normal compressibility, respiratory phasicity and response to augmentation. Popliteal Vein: No evidence of thrombus. Normal compressibility, respiratory phasicity and response to augmentation. Calf Veins: No evidence of thrombus. Normal compressibility and flow on color Doppler imaging. Superficial Great Saphenous Vein: No evidence of thrombus. Normal compressibility. Venous Reflux:  None. Other Findings:  None. IMPRESSION: No evidence of deep venous thrombosis in either lower extremity. Electronically Signed   By: Fernando Hoyer M.D.   On: 12/18/2023 15:47   ECHOCARDIOGRAM COMPLETE Result Date: 12/17/2023    ECHOCARDIOGRAM REPORT   Patient Name:   Evan Berry Date of Exam: 12/17/2023 Medical Rec #:  [id number]        Height:       72.0 in Accession #:    [id number]       Weight:       389.6 lb Date of Birth:  Feb 26, 1956         BSA:           2.828 m Patient Age:    67 years         BP:  142/82 mmHg Patient Gender: M                HR:           117 bpm. Exam Location:  Outpatient Procedure: 2D Echo, Color Doppler, Cardiac Doppler and Intracardiac            Opacification Agent (Both Spectral and Color Flow Doppler were            utilized during procedure). Indications:    CHF  History:        Patient has prior history of Echocardiogram examinations, most                 recent 08/17/2022. Arrythmias:Atrial Fibrillation; Risk                 Factors:Hypertension, Diabetes, Dyslipidemia and Former Smoker.  Sonographer:    Gelene Kelly RDCS Referring Phys: [phone] DAVID TAT IMPRESSIONS  1. Left ventricular ejection fraction, by estimation, is 65 to 70%. The left ventricle has normal function. Left ventricular endocardial border not optimally defined to evaluate regional wall motion. There is mild left ventricular hypertrophy. Left ventricular diastolic parameters are indeterminate.  2. RV not well visualized, grossly appears normal in size and function. . Right ventricular systolic function was not well visualized. The right ventricular size is not well visualized. Tricuspid regurgitation signal is inadequate for assessing PA pressure.  3. The mitral valve was not well visualized. Trivial mitral valve regurgitation. No evidence of mitral stenosis.  4. The aortic valve was not well visualized. Aortic valve regurgitation is not visualized. No aortic stenosis is present. FINDINGS  Left Ventricle: Left ventricular ejection fraction, by estimation, is 65 to 70%. The left ventricle has normal function. Left ventricular endocardial border not optimally defined to evaluate regional wall motion. Definity contrast agent was given IV to delineate the left ventricular endocardial borders. The left ventricular internal cavity size was normal in size. There is mild left ventricular hypertrophy. Left ventricular diastolic parameters are indeterminate. Right  Ventricle: RV not well visualized, grossly appears normal in size and function. The right ventricular size is not well visualized. Right vetricular wall thickness was not well visualized. Right ventricular systolic function was not well visualized.  Tricuspid regurgitation signal is inadequate for assessing PA pressure. Left Atrium: Left atrial size was normal in size. Right Atrium: Right atrial size was normal in size. Pericardium: There is no evidence of pericardial effusion. Mitral Valve: The mitral valve was not well visualized. Trivial mitral valve regurgitation. No evidence of mitral valve stenosis. Tricuspid Valve: The tricuspid valve is not well visualized. Tricuspid valve regurgitation is not demonstrated. No evidence of tricuspid stenosis. Aortic Valve: The aortic valve was not well visualized. Aortic valve regurgitation is not visualized. No aortic stenosis is present. Aortic valve mean gradient measures 7.0 mmHg. Aortic valve peak gradient measures 14.0 mmHg. Aortic valve area, by VTI measures 2.06 cm. Pulmonic Valve: The pulmonic valve was not well visualized. Pulmonic valve regurgitation is not visualized. No evidence of pulmonic stenosis. Aorta: The aortic root is normal in size and structure and the ascending aorta was not well visualized. Venous: Not well visualized. IAS/Shunts: The interatrial septum was not well visualized.  LEFT VENTRICLE PLAX 2D LVIDd:         5.80 cm   Diastology LVIDs:         3.10 cm   LV e' medial:    8.38 cm/s LV PW:  1.20 cm   LV E/e' medial:  13.5 LV IVS:        1.20 cm   LV e' lateral:   9.79 cm/s LVOT diam:     1.95 cm   LV E/e' lateral: 11.5 LV SV:         57 LV SV Index:   20 LVOT Area:     2.99 cm  RIGHT VENTRICLE RV Basal diam:  5.30 cm RV Mid diam:    3.80 cm RV S prime:     22.90 cm/s TAPSE (M-mode): 2.5 cm LEFT ATRIUM              Index        RIGHT ATRIUM           Index LA diam:        5.50 cm  1.94 cm/m   RA Area:     23.40 cm LA Vol (A2C):   56.7  ml  20.05 ml/m  RA Volume:   68.90 ml  24.36 ml/m LA Vol (A4C):   104.0 ml 36.77 ml/m LA Biplane Vol: 83.4 ml  29.49 ml/m  AORTIC VALVE AV Area (Vmax):    1.98 cm AV Area (Vmean):   2.11 cm AV Area (VTI):     2.06 cm AV Vmax:           187.11 cm/s AV Vmean:          122.803 cm/s AV VTI:            0.276 m AV Peak Grad:      14.0 mmHg AV Mean Grad:      7.0 mmHg LVOT Vmax:         124.00 cm/s LVOT Vmean:        86.600 cm/s LVOT VTI:          0.190 m LVOT/AV VTI ratio: 0.69  AORTA Ao Root diam: 2.60 cm MITRAL VALVE MV Area (PHT): 4.06 cm     SHUNTS MV Decel Time: 187 msec     Systemic VTI:  0.19 m MV E velocity: 113.00 cm/s  Systemic Diam: 1.95 cm MV A velocity: 44.80 cm/s MV E/A ratio:  2.52 Armida Lander MD Electronically signed by Armida Lander MD Signature Date/Time: 12/17/2023/12:55:56 PM    Final    DG Chest Port 1 View Result Date: 12/16/2023 EXAM: 1 VIEW XRAY OF THE CHEST 12/16/2023 09:39:00 PM COMPARISON: 01/06/2023 CLINICAL HISTORY: Questionable sepsis - evaluate for abnormality. Patient from home BIB [facility] w/ complaints of weakness since Tuesday which he noticed after he started to come down w/ cold symptoms- reports chills, cold sweats, intermittent difficulty breathing, congestion, cough. Denies sick contacts. Usually is able to ambulate at home, but has been unable to. EMS reports patient was hypoxic on RA on arrival at 90% and placed patient on 3 L of O2 w/ improvement. Patient w/ hx of Afib and HR 80-110 for EMS. Patient febrile 103.1. hx of diabetes, hypertension. Former smoker. FINDINGS: LUNGS AND PLEURA: No focal pulmonary opacity. No pulmonary edema. No pleural effusion. No pneumothorax. HEART AND MEDIASTINUM: The heart is top normal in size. No acute abnormality of the mediastinal silhouette. BONES AND SOFT TISSUES: No acute osseous abnormality. IMPRESSION: 1. No acute findings. Electronically signed by: Zadie Herter MD 12/16/2023 09:43 PM EDT RP Workstation: [id number]     Bobbetta Burnet, MD  [facility] Time spent 55 minutes -seeing evaluate patient, reviewing medical records, labs, meds, drawn plan of care  If 7PM-7AM, please contact night-coverage [email or web address] Password [email or web address] 12/20/2023, 12:01 PM   LOS: 3 days

## 2023-12-20 NOTE — Plan of Care (Signed)

## 2023-12-20 NOTE — Consult Note (Signed)
 Cardiology Consultation   Patient ID: [name] MRN: [id number]; DOB: Oct 10, 1955  Admit date: 12/16/2023 Date of Consult: 12/20/2023  PCP:  Aldo Hun, MD   [facility] Providers Cardiologist:  Nahser     Patient Profile: Evan Berry is a 68 y.o. male with a hx of OSA, longstanding persistent afib, HTN, CKD, DM who is being seen 12/20/2023 for the evaluation of afib at the request of Dr Tat.  History of Present Illness: Evan Berry 68 yo male history of OSA, afib with prior DCCV 2021 but recurrent afib, DM, HTN, CKD, admited 12/17/23 with weakness and SOB. Subjective fevers and chills. +LE edema, had stopped his home lasix. Febrile in ER, admitted to primary team with sepsis secondary to pyelo, pneumonia. Admission complicated by AKI on CKD, afib with elevated HRs. Cardiology consulted to help manage afib.     From afib clinic notes new diagnosis of afib around 2021. Had DCCV but early recurrence of afib. DId not feel any different in SR, had been rate controlled.    Admit labs K 4.2 Cr 1.89 BUN 42 WBC 7.4 Hgb 11 Plt 145 Procal 8.6 BNP 75 Lactic acid 2-->1.4 EKG afib 130s   12/2023 echo: LVE 65-70%, indet diastolic function, RV not well visualized groslly appears normal in size and function.  CXR no acute process CT C/A/P: LLL airspace disease, probable pneumonia.  Past Medical History:  Diagnosis Date   Allergic rhinitis    Arthritis    Blood transfusion without reported diagnosis    Cataract    Chronic kidney disease    pt. denies   Diabetes (HCC)    type 2   Dyspnea on exertion    GERD (gastroesophageal reflux disease)    History of hiatal hernia    Hyperlipidemia    Hypertension    Hypothyroidism    Iron deficiency anemia    OSA (obstructive sleep apnea)    PUD (peptic ulcer disease)    Sleep apnea    WEARS CPAP    Past Surgical History:  Procedure Laterality Date   BACK SURGERY  11/2008   x2   bleeding ulcer surgery      CARDIOVERSION N/A 01/29/2020   Procedure: CARDIOVERSION;  Surgeon: Luana Rumple, MD;  Location: [facility];  Service: Cardiovascular;  Laterality: N/A;   CHOLECYSTECTOMY  1998   COLONOSCOPY     COLONOSCOPY WITH PROPOFOL  N/A 09/26/2022   Procedure: COLONOSCOPY WITH PROPOFOL ;  Surgeon: Asencion Blacksmith, MD;  Location: [facility];  Service: Gastroenterology;  Laterality: N/A;   ESOPHAGOGASTRODUODENOSCOPY (EGD) WITH PROPOFOL  N/A 12/18/2017   Procedure: ESOPHAGOGASTRODUODENOSCOPY (EGD) WITH PROPOFOL ;  Surgeon: Asencion Blacksmith, MD;  Location: [facility];  Service: Endoscopy;  Laterality: N/A;   ESOPHAGOGASTRODUODENOSCOPY (EGD) WITH PROPOFOL  N/A 09/26/2022   Procedure: ESOPHAGOGASTRODUODENOSCOPY (EGD) WITH PROPOFOL ;  Surgeon: Asencion Blacksmith, MD;  Location: [facility];  Service: Gastroenterology;  Laterality: N/A;   GASTRIC BYPASS  1983   GASTRIC RESTRICTION SURGERY     POLYPECTOMY  12/18/2017   Procedure: POLYPECTOMY;  Surgeon: Asencion Blacksmith, MD;  Location: [facility];  Service: Endoscopy;;   POLYPECTOMY  09/26/2022   Procedure: POLYPECTOMY;  Surgeon: Asencion Blacksmith, MD;  Location: [facility];  Service: Gastroenterology;;     Scheduled Meds:  apixaban   5 mg Oral BID   cyanocobalamin  2,500 mcg Oral QHS   ezetimibe  10 mg Oral Daily   insulin aspart  0-9 Units Subcutaneous TID WC   levothyroxine  150 mcg  Oral Once per day on Monday Tuesday Wednesday Thursday Friday Saturday   levothyroxine  300 mcg Oral Every Sunday   metoprolol  tartrate  25 mg Oral Q6H   pantoprazole  40 mg Oral Daily   potassium chloride  20 mEq Oral Daily   rosuvastatin  20 mg Oral Daily   sodium chloride  flush  3 mL Intravenous Q12H   Continuous Infusions:  azithromycin 500 mg (12/20/23 0558)   cefTRIAXone (ROCEPHIN)  IV 2 g (12/19/23 2019)   PRN Meds: acetaminophen  **OR** acetaminophen , acetaminophen , levalbuterol, ondansetron  **OR** ondansetron  (ZOFRAN ) IV, sodium chloride  flush  Allergies:     Allergies  Allergen Reactions   Penicillins Other (See Comments)    Can't remember reaction, childhood allergy     Social History:   Social History   Socioeconomic History   Marital status: Married    Spouse name: Tanis Fan   Number of children: 2   Years of education: Not on file   Highest education level: Not on file  Occupational History   Occupation: MAINTENANCE    Employer: [employer]  Tobacco Use   Smoking status: Former    Current packs/day: 0.00    Average packs/day: 1 pack/day for 1 year (1.0 ttl pk-yrs)    Types: Cigarettes    Start date: 07/19/1979    Quit date: 07/18/1980    Years since quitting: 43.4   Smokeless tobacco: Never  Vaping Use   Vaping status: Never Used  Substance and Sexual Activity   Alcohol  use: No    Alcohol /week: 0.0 standard drinks of alcohol    Drug use: No   Sexual activity: Yes  Other Topics Concern   Not on file  Social History Narrative   Not on file   Social Drivers of Health   Financial Resource Strain: Not on file  Food Insecurity: Not on file  Transportation Needs: Not on file  Physical Activity: Not on file  Stress: Not on file  Social Connections: Not on file  Intimate Partner Violence: Not on file    Family History:   Family History  Problem Relation Age of Onset   Heart disease Maternal Grandmother    Heart disease Mother    Cancer Paternal Grandmother        unsure what kind   Lung cancer Father    Colon cancer Neg Hx    Esophageal cancer Neg Hx    Rectal cancer Neg Hx    Stomach cancer Neg Hx      ROS:  Please see the history of present illness.   All other ROS reviewed and negative.     Physical Exam/Data: Vitals:   12/19/23 1950 12/20/23 0111 12/20/23 0518 12/20/23 0805  BP: (!) 146/85 (!) 158/99 131/81 139/84  Pulse: 90 86 83 84  Resp: 20  20   Temp: 98 F (36.7 C)  98.6 F (37 C) 98 F (36.7 C)  TempSrc: Oral  Oral Oral  SpO2: 99% 99% 99% 97%  Weight:   (!) 175.4 kg   Height:         Intake/Output Summary (Last 24 hours) at 12/20/2023 0857 Last data filed at 12/20/2023 1610 Gross per 24 hour  Intake 720 ml  Output 2550 ml  Net -1830 ml      12/20/2023    5:18 AM 12/19/2023    5:00 AM 12/18/2023    3:49 AM  Last 3 Weights  Weight (lbs) 386 lb 11 oz 388 lb 7.2 oz 385 lb 12.9 oz  Weight (kg) 175.4 kg 176.2 kg 175 kg     Body mass index is 52.44 kg/m.  General:  Well nourished, well developed, in no acute distress HEENT: normal Neck: no JVD Vascular: No carotid bruits; Distal pulses 2+ bilaterally Cardiac:  irreg Lungs:  clear to auscultation bilaterally, no wheezing, rhonchi or rales  Abd: soft, nontender, no hepatomegaly  Ext:1+ bilateral LE edema Musculoskeletal:  No deformities, BUE and BLE strength normal and equal Skin: warm and dry  Neuro:  CNs 2-12 intact, no focal abnormalities noted Psych:  Normal affect     Laboratory Data: High Sensitivity Troponin:  No results for input(s): "TROPONINIHS" in the last 720 hours.   Chemistry Recent Labs  Lab 12/17/23 0737 12/18/23 0319 12/19/23 0520 12/20/23 0446  NA 133* 133* 130* 132*  K 3.8 3.4* 3.3* 3.6  CL 100 103 99 103  CO2 21* 21* 22 22  GLUCOSE 173* 199* 167* 209*  BUN 42* 42* 48* 46*  CREATININE 1.92* 2.28* 2.44* 2.14*  CALCIUM 8.0* 8.0* 8.0* 8.1*  MG 1.8 1.8 1.9  --   GFRNONAA 37* 30* 28* 33*  ANIONGAP 12 9 9 7     Recent Labs  Lab 12/16/23 2147 12/17/23 0737 12/20/23 0446  PROT 6.8 6.4*  --   ALBUMIN 3.1* 2.9* 2.4*  AST 55* 56*  --   ALT 33 34  --   ALKPHOS 43 39  --   BILITOT 0.8 0.7  --    Lipids No results for input(s): "CHOL", "TRIG", "HDL", "LABVLDL", "LDLCALC", "CHOLHDL" in the last 168 hours.  Hematology Recent Labs  Lab 12/16/23 2147 12/17/23 0737 12/20/23 0446  WBC 7.4 7.0 4.6  RBC 4.38 4.04* 3.91*  HGB 11.0* 9.8* 9.6*  HCT 34.4* 31.9* 30.7*  MCV 78.5* 79.0* 78.5*  MCH 25.1* 24.3* 24.6*  MCHC 32.0 30.7 31.3  RDW 16.4* 16.5* 17.1*  PLT 145* 137* 134*   Thyroid    Recent Labs  Lab 12/18/23 0319  TSH 0.861    BNP Recent Labs  Lab 12/17/23 0848  BNP 75.0    DDimer No results for input(s): "DDIMER" in the last 168 hours.  Radiology/Studies:  CT CHEST ABDOMEN PELVIS WO CONTRAST Result Date: 12/18/2023 CLINICAL DATA:  UTI, persistent fever, concerned about pyelonephritis, abscess Weakness.  Chills. EXAM: CT CHEST, ABDOMEN AND PELVIS WITHOUT CONTRAST TECHNIQUE: Multidetector CT imaging of the chest, abdomen and pelvis was performed following the standard protocol without IV contrast. RADIATION DOSE REDUCTION: This exam was performed according to the departmental dose-optimization program which includes automated exposure control, adjustment of the mA and/or kV according to patient size and/or use of iterative reconstruction technique. COMPARISON:  Chest radiograph 12/16/2023. Chest, abdomen, pelvic CT 01/07/2023 FINDINGS: CT CHEST FINDINGS Cardiovascular: The heart is upper normal in size. No significant pericardial effusion. Coronary artery and mitral annulus calcifications. Aortic atherosclerosis without aneurysm. Mediastinum/Nodes: Multiple small mediastinal lymph nodes, not enlarged by size criteria. Hilar assessment is limited in the absence of IV contrast. Mild distal esophageal wall thickening. No visible thyroid  nodule. Lungs/Pleura: Left lower lobe airspace disease standing from the fissure to the pleural surface with air bronchograms, highly suspicious for pneumonia. Surrounding ground-glass opacity throughout the left lower lobe. Trace left pleural effusion. No additional focal airspace disease. The trachea and central airways are clear. Musculoskeletal: Multiple remote right rib fractures. Thoracic spondylosis with anterior spurring. There are no acute or suspicious osseous abnormalities. CT ABDOMEN PELVIS FINDINGS Hepatobiliary: The liver is enlarged spanning 23.6 cm cranial caudal. Diffuse hepatic  steatosis. No evidence of focal liver abnormality on  this unenhanced exam. Clips in the gallbladder fossa postcholecystectomy. No biliary dilatation. Pancreas: No ductal dilatation or inflammation. Elongated peripherally calcified density adjacent to the pancreatic body and tail is unchanged from prior exam. Spleen: Enlarged, 16 cm AP.  No focal abnormality. Adrenals/Urinary Tract: Left adrenal mass with macroscopic fat measuring 5.4 cm consistent with a benign adrenal myelolipoma. Normal right adrenal gland. No hydronephrosis. Mild symmetric bilateral perinephric edema. There is no obvious renal fluid collection on this unenhanced exam. Soft tissue attenuation from habitus limits detailed assessment. Small right lower pole renal cyst. No visible renal calculi. Unremarkable urinary bladder. Stomach/Bowel: Administered enteric contrast reaches the distal small bowel and colon. There is no bowel obstruction. No evidence of bowel inflammation. Postsurgical change in the stomach. The appendix is normal. Vascular/Lymphatic: Normal caliber abdominal aorta with mild atherosclerosis. Left upper quadrant collaterals are unchanged from prior exam. Reactive appearing prominent periportal nodes measuring up to 12 mm, series 2, image 68. Reproductive: Prostate is unremarkable. Other: No ascites or free air. Moderate-sized supraumbilical ventral abdominal wall hernia contains only fat. There is a diminutive fat containing umbilical hernia. Musculoskeletal: Degenerative and postsurgical change in the lumbar spine. IMPRESSION: 1. Left lower lobe airspace disease with air bronchograms, highly suspicious for pneumonia. Trace left pleural effusion. 2. Mild symmetric bilateral perinephric edema, nonspecific. No obvious renal fluid collection on this unenhanced exam. 3. Hepatosplenomegaly with hepatic steatosis. 4. Chronic calcified peripancreatic structure likely a chronically calcified pseudocyst. This is unchanged from prior imaging, and needs no specific imaging follow-up. 5. Mild  distal esophageal wall thickening, can be seen with reflux. 6. Moderate-sized supraumbilical ventral abdominal wall hernia contains only fat. Aortic Atherosclerosis (ICD10-I70.0). Electronically Signed   By: Chadwick Colonel M.D.   On: 12/18/2023 19:59   US  Venous Img Lower Bilateral (DVT) Result Date: 12/18/2023 CLINICAL DATA:  Bilateral lower extremity edema EXAM: BILATERAL LOWER EXTREMITY VENOUS DOPPLER ULTRASOUND TECHNIQUE: Gray-scale sonography with graded compression, as well as color Doppler and duplex ultrasound were performed to evaluate the lower extremity deep venous systems from the level of the common femoral vein and including the common femoral, femoral, profunda femoral, popliteal and calf veins including the posterior tibial, peroneal and gastrocnemius veins when visible. The superficial great saphenous vein was also interrogated. Spectral Doppler was utilized to evaluate flow at rest and with distal augmentation maneuvers in the common femoral, femoral and popliteal veins. COMPARISON:  None Available. FINDINGS: RIGHT LOWER EXTREMITY Common Femoral Vein: No evidence of thrombus. Normal compressibility, respiratory phasicity and response to augmentation. Saphenofemoral Junction: No evidence of thrombus. Normal compressibility and flow on color Doppler imaging. Profunda Femoral Vein: No evidence of thrombus. Normal compressibility and flow on color Doppler imaging. Femoral Vein: No evidence of thrombus. Normal compressibility, respiratory phasicity and response to augmentation. Popliteal Vein: No evidence of thrombus. Normal compressibility, respiratory phasicity and response to augmentation. Calf Veins: No evidence of thrombus. Normal compressibility and flow on color Doppler imaging. Superficial Great Saphenous Vein: No evidence of thrombus. Normal compressibility. Venous Reflux:  None. Other Findings:  None. LEFT LOWER EXTREMITY Common Femoral Vein: No evidence of thrombus. Normal  compressibility, respiratory phasicity and response to augmentation. Saphenofemoral Junction: No evidence of thrombus. Normal compressibility and flow on color Doppler imaging. Profunda Femoral Vein: No evidence of thrombus. Normal compressibility and flow on color Doppler imaging. Femoral Vein: No evidence of thrombus. Normal compressibility, respiratory phasicity and response to augmentation. Popliteal Vein: No evidence of thrombus. Normal compressibility,  respiratory phasicity and response to augmentation. Calf Veins: No evidence of thrombus. Normal compressibility and flow on color Doppler imaging. Superficial Great Saphenous Vein: No evidence of thrombus. Normal compressibility. Venous Reflux:  None. Other Findings:  None. IMPRESSION: No evidence of deep venous thrombosis in either lower extremity. Electronically Signed   By: Fernando Hoyer M.D.   On: 12/18/2023 15:47   ECHOCARDIOGRAM COMPLETE Result Date: 12/17/2023    ECHOCARDIOGRAM REPORT   Patient Name:   Evan Berry Date of Exam: 12/17/2023 Medical Rec #:  [id number]        Height:       72.0 in Accession #:    [id number]       Weight:       389.6 lb Date of Birth:  Apr 28, 1956         BSA:          2.828 m Patient Age:    67 years         BP:           142/82 mmHg Patient Gender: M                HR:           117 bpm. Exam Location:  Outpatient Procedure: 2D Echo, Color Doppler, Cardiac Doppler and Intracardiac            Opacification Agent (Both Spectral and Color Flow Doppler were            utilized during procedure). Indications:    CHF  History:        Patient has prior history of Echocardiogram examinations, most                 recent 08/17/2022. Arrythmias:Atrial Fibrillation; Risk                 Factors:Hypertension, Diabetes, Dyslipidemia and Former Smoker.  Sonographer:    Gelene Kelly RDCS Referring Phys: [phone] DAVID TAT IMPRESSIONS  1. Left ventricular ejection fraction, by estimation, is 65 to 70%. The left ventricle has normal  function. Left ventricular endocardial border not optimally defined to evaluate regional wall motion. There is mild left ventricular hypertrophy. Left ventricular diastolic parameters are indeterminate.  2. RV not well visualized, grossly appears normal in size and function. . Right ventricular systolic function was not well visualized. The right ventricular size is not well visualized. Tricuspid regurgitation signal is inadequate for assessing PA pressure.  3. The mitral valve was not well visualized. Trivial mitral valve regurgitation. No evidence of mitral stenosis.  4. The aortic valve was not well visualized. Aortic valve regurgitation is not visualized. No aortic stenosis is present. FINDINGS  Left Ventricle: Left ventricular ejection fraction, by estimation, is 65 to 70%. The left ventricle has normal function. Left ventricular endocardial border not optimally defined to evaluate regional wall motion. Definity contrast agent was given IV to delineate the left ventricular endocardial borders. The left ventricular internal cavity size was normal in size. There is mild left ventricular hypertrophy. Left ventricular diastolic parameters are indeterminate. Right Ventricle: RV not well visualized, grossly appears normal in size and function. The right ventricular size is not well visualized. Right vetricular wall thickness was not well visualized. Right ventricular systolic function was not well visualized.  Tricuspid regurgitation signal is inadequate for assessing PA pressure. Left Atrium: Left atrial size was normal in size. Right Atrium: Right atrial size was normal in size. Pericardium: There is no evidence of pericardial effusion.  Mitral Valve: The mitral valve was not well visualized. Trivial mitral valve regurgitation. No evidence of mitral valve stenosis. Tricuspid Valve: The tricuspid valve is not well visualized. Tricuspid valve regurgitation is not demonstrated. No evidence of tricuspid stenosis. Aortic  Valve: The aortic valve was not well visualized. Aortic valve regurgitation is not visualized. No aortic stenosis is present. Aortic valve mean gradient measures 7.0 mmHg. Aortic valve peak gradient measures 14.0 mmHg. Aortic valve area, by VTI measures 2.06 cm. Pulmonic Valve: The pulmonic valve was not well visualized. Pulmonic valve regurgitation is not visualized. No evidence of pulmonic stenosis. Aorta: The aortic root is normal in size and structure and the ascending aorta was not well visualized. Venous: Not well visualized. IAS/Shunts: The interatrial septum was not well visualized.  LEFT VENTRICLE PLAX 2D LVIDd:         5.80 cm   Diastology LVIDs:         3.10 cm   LV e' medial:    8.38 cm/s LV PW:         1.20 cm   LV E/e' medial:  13.5 LV IVS:        1.20 cm   LV e' lateral:   9.79 cm/s LVOT diam:     1.95 cm   LV E/e' lateral: 11.5 LV SV:         57 LV SV Index:   20 LVOT Area:     2.99 cm  RIGHT VENTRICLE RV Basal diam:  5.30 cm RV Mid diam:    3.80 cm RV S prime:     22.90 cm/s TAPSE (M-mode): 2.5 cm LEFT ATRIUM              Index        RIGHT ATRIUM           Index LA diam:        5.50 cm  1.94 cm/m   RA Area:     23.40 cm LA Vol (A2C):   56.7 ml  20.05 ml/m  RA Volume:   68.90 ml  24.36 ml/m LA Vol (A4C):   104.0 ml 36.77 ml/m LA Biplane Vol: 83.4 ml  29.49 ml/m  AORTIC VALVE AV Area (Vmax):    1.98 cm AV Area (Vmean):   2.11 cm AV Area (VTI):     2.06 cm AV Vmax:           187.11 cm/s AV Vmean:          122.803 cm/s AV VTI:            0.276 m AV Peak Grad:      14.0 mmHg AV Mean Grad:      7.0 mmHg LVOT Vmax:         124.00 cm/s LVOT Vmean:        86.600 cm/s LVOT VTI:          0.190 m LVOT/AV VTI ratio: 0.69  AORTA Ao Root diam: 2.60 cm MITRAL VALVE MV Area (PHT): 4.06 cm     SHUNTS MV Decel Time: 187 msec     Systemic VTI:  0.19 m MV E velocity: 113.00 cm/s  Systemic Diam: 1.95 cm MV A velocity: 44.80 cm/s MV E/A ratio:  2.52 Armida Lander MD Electronically signed by Armida Lander  MD Signature Date/Time: 12/17/2023/12:55:56 PM    Final    DG Chest Port 1 View Result Date: 12/16/2023 EXAM: 1 VIEW XRAY OF THE CHEST 12/16/2023 09:39:00 PM COMPARISON: 01/06/2023  CLINICAL HISTORY: Questionable sepsis - evaluate for abnormality. Patient from home BIB [facility] w/ complaints of weakness since Tuesday which he noticed after he started to come down w/ cold symptoms- reports chills, cold sweats, intermittent difficulty breathing, congestion, cough. Denies sick contacts. Usually is able to ambulate at home, but has been unable to. EMS reports patient was hypoxic on RA on arrival at 90% and placed patient on 3 L of O2 w/ improvement. Patient w/ hx of Afib and HR 80-110 for EMS. Patient febrile 103.1. hx of diabetes, hypertension. Former smoker. FINDINGS: LUNGS AND PLEURA: No focal pulmonary opacity. No pulmonary edema. No pleural effusion. No pneumothorax. HEART AND MEDIASTINUM: The heart is top normal in size. No acute abnormality of the mediastinal silhouette. BONES AND SOFT TISSUES: No acute osseous abnormality. IMPRESSION: 1. No acute findings. Electronically signed by: Zadie Herter MD 12/16/2023 09:43 PM EDT RP Workstation: [id number]     Assessment and Plan: Chronic HFpEF - 12/2023 echo: LVE 65-70%, indet diastolic function, RV not well visualized groslly appears normal in size and function.  CXR no acute process, BNP 75 CT C/A/P: LLL airspace disease, probable pneumonia.  - received IV lasix, uptrend in Cr and discontinued.  - reds vest 36%.  - edema may be more obesity related, venous insufficiency. BMI 52. Uptrend in Cr with attempt diuresis. CT withut significant edema, BNP normal. Echo some limitation in visualization but normal LVEF. Cannot determine diastolic function in setting of afib but normal LA size indicates no severe longstanding dysfunction. No JVD, lung without significant crackles. Sats 97% on RA - no additional diuresis at this time.    2. Longstanding persistent  afib - prior DCCV with recurrent afib shortly after. DId not feel any different in SR, has been managed with rate control as outpatient - home regimen lopressor  25mg  bid, eliquis  5mg  bid - afib with elevated rates in setting of pneumonia, hypoxia, pyelo, sepsis   - on metoprolol  25mg  every 6 hrs. BP's tolerating, tele shows afib variable rates. Increase to lopressor  37.5mg  q6 hrs - rates should improve as systemic illness resolves  3.UTI/pneumonia/sepsis - per primary team   4.AKI on CKD - admit Cr 1.89, baseline 1.1-1.3 - further increase in Cr to 2.4 - followed by nephrology  5.Hypoxia - primarily related to pneumonia -CT C/A/P: LLL airspace disease, probable pneumonia. -off O2, RA this morning sats 97%       For questions or updates, please contact [facility] Please consult [email or web address] for contact info under    Signed, Armida Lander, MD  12/20/2023 8:57 AM

## 2023-12-21 DIAGNOSIS — R6510 Systemic inflammatory response syndrome (SIRS) of non-infectious origin without acute organ dysfunction: Secondary | ICD-10-CM | POA: Diagnosis not present

## 2023-12-21 LAB — BASIC METABOLIC PANEL WITH GFR
Anion gap: 14 (ref 5–15)
BUN: 39 mg/dL — ABNORMAL HIGH (ref 8–23)
CO2: 21 mmol/L — ABNORMAL LOW (ref 22–32)
Calcium: 8.5 mg/dL — ABNORMAL LOW (ref 8.9–10.3)
Chloride: 102 mmol/L (ref 98–111)
Creatinine, Ser: 1.83 mg/dL — ABNORMAL HIGH (ref 0.61–1.24)
GFR, Estimated: 39 mL/min — ABNORMAL LOW (ref >60)
Glucose, Bld: 233 mg/dL — ABNORMAL HIGH (ref 70–99)
Potassium: 3.7 mmol/L (ref 3.5–5.1)
Sodium: 137 mmol/L (ref 135–145)

## 2023-12-21 LAB — PROTEIN / CREATININE RATIO, URINE
Creatinine, Urine: 47 mg/dL
Protein Creatinine Ratio: 0.62 mg/mg{creat} — ABNORMAL HIGH (ref 0.00–0.15)
Total Protein, Urine: 29 mg/dL

## 2023-12-21 LAB — CBC
HCT: 32 % — ABNORMAL LOW (ref 39.0–52.0)
Hemoglobin: 9.7 g/dL — ABNORMAL LOW (ref 13.0–17.0)
MCH: 23.3 pg — ABNORMAL LOW (ref 26.0–34.0)
MCHC: 30.3 g/dL (ref 30.0–36.0)
MCV: 76.9 fL — ABNORMAL LOW (ref 80.0–100.0)
Platelets: 188 10*3/uL (ref 150–400)
RBC: 4.16 MIL/uL — ABNORMAL LOW (ref 4.22–5.81)
RDW: 17.2 % — ABNORMAL HIGH (ref 11.5–15.5)
WBC: 5.9 10*3/uL (ref 4.0–10.5)
nRBC: 0 % (ref 0.0–0.2)

## 2023-12-21 LAB — CULTURE, BLOOD (ROUTINE X 2)
Special Requests: ADEQUATE
Special Requests: ADEQUATE

## 2023-12-21 LAB — GLUCOSE, CAPILLARY
Glucose-Capillary: 241 mg/dL — ABNORMAL HIGH (ref 70–99)
Glucose-Capillary: 315 mg/dL — ABNORMAL HIGH (ref 70–99)
Glucose-Capillary: 319 mg/dL — ABNORMAL HIGH (ref 70–99)
Glucose-Capillary: 284 mg/dL — ABNORMAL HIGH (ref 70–99)

## 2023-12-21 LAB — KAPPA/LAMBDA LIGHT CHAINS
Kappa free light chain: 20.1 mg/L — ABNORMAL HIGH (ref 3.3–19.4)
Kappa, lambda light chain ratio: 1.1 (ref 0.26–1.65)
Lambda free light chains: 18.2 mg/L (ref 5.7–26.3)

## 2023-12-21 LAB — MAGNESIUM: Magnesium: 2 mg/dL (ref 1.7–2.4)

## 2023-12-21 MED ORDER — AZITHROMYCIN 250 MG PO TABS
500.0000 mg | ORAL_TABLET | Freq: Every day | ORAL | Status: DC
  Administered 2023-12-21 – 2023-12-22 (×2): 500 mg via ORAL
  Filled 2023-12-21 (×2): qty 2

## 2023-12-21 MED ORDER — INSULIN ASPART 100 UNIT/ML IJ SOLN: 0.0000 [IU] | Freq: Three times a day (TID) | INTRAMUSCULAR | Status: DC

## 2023-12-21 MED ORDER — INSULIN ASPART 100 UNIT/ML IJ SOLN
0.0000 [IU] | Freq: Every day | INTRAMUSCULAR | Status: DC
  Administered 2023-12-21: 3 [IU] via SUBCUTANEOUS

## 2023-12-21 MED ORDER — METOPROLOL TARTRATE 50 MG PO TABS
50.0000 mg | ORAL_TABLET | Freq: Four times a day (QID) | ORAL | Status: DC
  Administered 2023-12-21 – 2023-12-22 (×4): 50 mg via ORAL
  Filled 2023-12-21 (×4): qty 1

## 2023-12-21 MED ORDER — INSULIN GLARGINE-YFGN 100 UNIT/ML ~~LOC~~ SOLN
12.0000 [IU] | Freq: Every day | SUBCUTANEOUS | Status: DC
  Administered 2023-12-21 – 2023-12-22 (×2): 12 [IU] via SUBCUTANEOUS
  Filled 2023-12-21 (×3): qty 0.12

## 2023-12-21 NOTE — Progress Notes (Signed)
 Rounding Note   Patient Name: Evan Berry Date of Encounter: 12/21/2023  [facility] Cardiologist: Nahser  Subjective Some ongoing cough  Scheduled Meds:  apixaban   5 mg Oral BID   azithromycin  500 mg Oral Daily   cyanocobalamin  2,500 mcg Oral QHS   ezetimibe  10 mg Oral Daily   insulin aspart  0-9 Units Subcutaneous TID WC   insulin glargine-yfgn  12 Units Subcutaneous Daily   levothyroxine  150 mcg Oral Once per day on Monday Tuesday Wednesday Thursday Friday Saturday   levothyroxine  300 mcg Oral Every Sunday   metoprolol  tartrate  50 mg Oral Q6H   pantoprazole  40 mg Oral Daily   potassium chloride  20 mEq Oral Daily   rosuvastatin  20 mg Oral Daily   sodium chloride  flush  3 mL Intravenous Q12H   Continuous Infusions:  cefTRIAXone (ROCEPHIN)  IV Stopped (12/20/23 2300)   PRN Meds: acetaminophen  **OR** acetaminophen , acetaminophen , levalbuterol, ondansetron  **OR** ondansetron  (ZOFRAN ) IV, sodium chloride  flush   Vital Signs  Vitals:   12/20/23 2040 12/21/23 0016 12/21/23 0414 12/21/23 0548  BP: (!) 145/84 (!) 165/106 (!) 156/81 (!) 154/97  Pulse: 78 98 85 83  Resp: 17  16   Temp: (!) 97.4 F (36.3 C)  (!) 97.5 F (36.4 C)   TempSrc: Oral  Oral   SpO2: 97%  97%   Weight:   (!) 170 kg   Height:        Intake/Output Summary (Last 24 hours) at 12/21/2023 0844 Last data filed at 12/21/2023 0300 Gross per 24 hour  Intake 960 ml  Output 2600 ml  Net -1640 ml      12/21/2023    4:14 AM 12/20/2023    5:18 AM 12/19/2023    5:00 AM  Last 3 Weights  Weight (lbs) 374 lb 12.5 oz 386 lb 11 oz 388 lb 7.2 oz  Weight (kg) 170 kg 175.4 kg 176.2 kg      Telemetry Afib variable rtes - Personally Reviewed  ECG  N/a - Personally Reviewed  Physical Exam  GEN: No acute distress.   Neck: No JVD Cardiac: irreg  Respiratory: Clear to auscultation bilaterally. GI: Soft, nontender, non-distended  MS: 1+ bilateral LE edema Neuro:  Nonfocal  Psych: Normal  affect   Labs High Sensitivity Troponin:  No results for input(s): "TROPONINIHS" in the last 720 hours.   Chemistry Recent Labs  Lab 12/16/23 2147 12/17/23 0737 12/18/23 0319 12/19/23 0520 12/20/23 0446 12/21/23 0412  NA 133* 133* 133* 130* 132* 137  K 4.2 3.8 3.4* 3.3* 3.6 3.7  CL 99 100 103 99 103 102  CO2 21* 21* 21* 22 22 21*  GLUCOSE 228* 173* 199* 167* 209* 233*  BUN 42* 42* 42* 48* 46* 39*  CREATININE 1.89* 1.92* 2.28* 2.44* 2.14* 1.83*  CALCIUM 8.5* 8.0* 8.0* 8.0* 8.1* 8.5*  MG  --  1.8 1.8 1.9  --   --   PROT 6.8 6.4*  --   --   --   --   ALBUMIN 3.1* 2.9*  --   --  2.4*  --   AST 55* 56*  --   --   --   --   ALT 33 34  --   --   --   --   ALKPHOS 43 39  --   --   --   --   BILITOT 0.8 0.7  --   --   --   --  GFRNONAA 38* 37* 30* 28* 33* 39*  ANIONGAP 13 12 9 9 7 14     Lipids No results for input(s): "CHOL", "TRIG", "HDL", "LABVLDL", "LDLCALC", "CHOLHDL" in the last 168 hours.  Hematology Recent Labs  Lab 12/17/23 0737 12/20/23 0446 12/21/23 0412  WBC 7.0 4.6 5.9  RBC 4.04* 3.91* 4.16*  HGB 9.8* 9.6* 9.7*  HCT 31.9* 30.7* 32.0*  MCV 79.0* 78.5* 76.9*  MCH 24.3* 24.6* 23.3*  MCHC 30.7 31.3 30.3  RDW 16.5* 17.1* 17.2*  PLT 137* 134* 188   Thyroid   Recent Labs  Lab 12/18/23 0319  TSH 0.861    BNP Recent Labs  Lab 12/17/23 0848  BNP 75.0    DDimer No results for input(s): "DDIMER" in the last 168 hours.   Radiology  No results found.  Cardiac Studies   Patient Profile   JODEN BONSALL is a 68 y.o. male with a hx of OSA, longstanding persistent afib, HTN, CKD, DM who is being seen 12/20/2023 for the evaluation of afib at the request of Dr Tat.     Assessment & Plan  Chronic HFpEF - 12/2023 echo: LVE 65-70%, indet diastolic function, RV not well visualized groslly appears normal in size and function.  CXR no acute process, BNP 75 CT C/A/P: LLL airspace disease, probable pneumonia.  - received IV lasix, uptrend in Cr and discontinued.  AKI is improving - reds vest 36%.  - standing weights documented as down 14 lbs over 2 days, unclear accuracy.  - did get 3L of NS/LR on admission in setting of sepsis. From neprhology thought to have had ischemic ATN, perhaps  self diuresing to some degree as ATN resolves  - he reports chronic edema, current level similar to chronic he reports. edema may be more obesity related, venous insufficiency. BMI 52. Uptrend in Cr with attempt diuresis. CT withut significant edema, BNP normal. Echo some limitation in visualization but normal LVEF. Cannot determine diastolic function in setting of afib but normal LA size indicates no severe longstanding dysfunction. No JVD, lungs without significant crackles. Sats 97% on RA - continue to hold on any additional diuresis at this time.      2. Longstanding persistent afib - prior DCCV with recurrent afib shortly after. DId not feel any different in SR, has been managed with rate control as outpatient - home regimen lopressor  25mg  bid, eliquis  5mg  bid - afib with elevated rates in setting of pneumonia, hypoxia, pyelo, sepsis    - on lopressor  37.5mg  qid, primary team increased to 50mg  qid. Follow rates.  - rates should improve as systemic illness resolves   3.UTI/pneumonia/sepsis - per primary team     4.AKI on CKD - admit Cr 1.89, baseline 1.1-1.3 - further increase in Cr to 2.4 - followed by nephrology   5.Hypoxia - primarily related to pneumonia -CT C/A/P: LLL airspace disease, probable pneumonia. -off O2, RA this morning sats 97%  6.HTN - follow with recent increase in lopressor .   For questions or updates, please contact [facility] Please consult [email or web address] for contact info under     Signed, Armida Lander, MD  12/21/2023, 8:44 AM

## 2023-12-21 NOTE — Plan of Care (Signed)

## 2023-12-21 NOTE — Progress Notes (Signed)
 Mobility Specialist Progress Note:    12/21/23 1404  Mobility  Activity Ambulated with assistance in room  Level of Assistance Standby assist, set-up cues, supervision of patient - no hands on  Assistive Device None  Distance Ambulated (ft) 65 ft  Range of Motion/Exercises Active;All extremities  Activity Response Tolerated well  Mobility Referral Yes  Mobility visit 1 Mobility  Mobility Specialist Start Time (ACUTE ONLY) 1340  Mobility Specialist Stop Time (ACUTE ONLY) 1400  Mobility Specialist Time Calculation (min) (ACUTE ONLY) 20 min   Pt received in chair, agreeable to mobility. Required supervision to stand and ambulate with no AD. Tolerated well, SpO2 95% on RA throughout session. Left pt sitting EOB, all needs met.   Glinda Lapping Mobility Specialist Please contact via Special educational needs teacher or  Rehab office at [phone]

## 2023-12-21 NOTE — Progress Notes (Signed)
 Patient ID: Evan Berry, male   DOB: 1955/09/21, 68 y.o.   MRN: [id number] S: Feeling better but still having episodes of SOB. O:BP (!) 154/97   Pulse 83   Temp (!) 97.5 F (36.4 C) (Oral)   Resp 16   Ht 6' (1.829 m)   Wt (!) 170 kg   SpO2 97%   BMI 50.83 kg/m   Intake/Output Summary (Last 24 hours) at 12/21/2023 1005 Last data filed at 12/21/2023 0300 Gross per 24 hour  Intake 720 ml  Output 2600 ml  Net -1880 ml   Intake/Output: I/O last 3 completed shifts: In: 1680 [P.O.:1680] Out: 4750 [Urine:4750]  Intake/Output this shift:  No intake/output data recorded. Weight change: -5.4 kg Gen: NAD CVS: RRR Resp:Crackles at LLL field Abd: obese, +BS, soft, NT/ND Ext: 1+ pretibial edema bilaterally  Recent Labs  Lab 12/16/23 2147 12/17/23 0737 12/18/23 0319 12/19/23 0520 12/20/23 0446 12/21/23 0412  NA 133* 133* 133* 130* 132* 137  K 4.2 3.8 3.4* 3.3* 3.6 3.7  CL 99 100 103 99 103 102  CO2 21* 21* 21* 22 22 21*  GLUCOSE 228* 173* 199* 167* 209* 233*  BUN 42* 42* 42* 48* 46* 39*  CREATININE 1.89* 1.92* 2.28* 2.44* 2.14* 1.83*  ALBUMIN 3.1* 2.9*  --   --  2.4*  --   CALCIUM 8.5* 8.0* 8.0* 8.0* 8.1* 8.5*  PHOS  --  3.0  --   --  3.6  --   AST 55* 56*  --   --   --   --   ALT 33 34  --   --   --   --    Liver Function Tests: Recent Labs  Lab 12/16/23 2147 12/17/23 0737 12/20/23 0446  AST 55* 56*  --   ALT 33 34  --   ALKPHOS 43 39  --   BILITOT 0.8 0.7  --   PROT 6.8 6.4*  --   ALBUMIN 3.1* 2.9* 2.4*   No results for input(s): "LIPASE", "AMYLASE" in the last 168 hours. No results for input(s): "AMMONIA" in the last 168 hours. CBC: Recent Labs  Lab 12/16/23 2147 12/17/23 0737 12/20/23 0446 12/21/23 0412  WBC 7.4 7.0 4.6 5.9  NEUTROABS 6.5  --   --   --   HGB 11.0* 9.8* 9.6* 9.7*  HCT 34.4* 31.9* 30.7* 32.0*  MCV 78.5* 79.0* 78.5* 76.9*  PLT 145* 137* 134* 188   Cardiac Enzymes: No results for input(s): "CKTOTAL", "CKMB", "CKMBINDEX", "TROPONINI"  in the last 168 hours. CBG: Recent Labs  Lab 12/20/23 0723 12/20/23 1059 12/20/23 1606 12/20/23 2042 12/21/23 0705  GLUCAP 204* 259* 272* 256* 241*    Iron Studies: No results for input(s): "IRON", "TIBC", "TRANSFERRIN", "FERRITIN" in the last 72 hours. Studies/Results: No results found.  apixaban   5 mg Oral BID   azithromycin  500 mg Oral Daily   cyanocobalamin  2,500 mcg Oral QHS   ezetimibe  10 mg Oral Daily   insulin aspart  0-9 Units Subcutaneous TID WC   insulin glargine-yfgn  12 Units Subcutaneous Daily   levothyroxine  150 mcg Oral Once per day on Monday Tuesday Wednesday Thursday Friday Saturday   levothyroxine  300 mcg Oral Every Sunday   metoprolol  tartrate  50 mg Oral Q6H   pantoprazole  40 mg Oral Daily   potassium chloride  20 mEq Oral Daily   rosuvastatin  20 mg Oral Daily   sodium chloride  flush  3 mL Intravenous  Q12H    BMET    Component Value Date/Time   NA 137 12/21/2023 0412   K 3.7 12/21/2023 0412   CL 102 12/21/2023 0412   CO2 21 (L) 12/21/2023 0412   GLUCOSE 233 (H) 12/21/2023 0412   BUN 39 (H) 12/21/2023 0412   CREATININE 1.83 (H) 12/21/2023 0412   CALCIUM 8.5 (L) 12/21/2023 0412   GFRNONAA 39 (L) 12/21/2023 0412   GFRAA >60 01/21/2020 0859   CBC    Component Value Date/Time   WBC 5.9 12/21/2023 0412   RBC 4.16 (L) 12/21/2023 0412   HGB 9.7 (L) 12/21/2023 0412   HCT 32.0 (L) 12/21/2023 0412   PLT 188 12/21/2023 0412   MCV 76.9 (L) 12/21/2023 0412   MCH 23.3 (L) 12/21/2023 0412   MCHC 30.3 12/21/2023 0412   RDW 17.2 (H) 12/21/2023 0412   LYMPHSABS 0.3 (L) 12/16/2023 2147   MONOABS 0.6 12/16/2023 2147   EOSABS 0.0 12/16/2023 2147   BASOSABS 0.0 12/16/2023 2147    Assessment/Plan:  AKI/CKD stage III - presumably ischemic ATN in the setting of sepsis/hypotension, volume depletion, and concomitant ACE inhibition.  No hydronephrosis on CT scan.  Scr and UOP have continued to improve after IVF's  No indication for dialysis at this time.   Will continue to follow closely while he remains an inpatient.  Continue to hold metformin and ramipril.  He will need to follow up with our practice in 3-4 weeks after discharge.     Avoid nephrotoxic medications including NSAIDs and iodinated intravenous contrast exposure unless the latter is absolutely indicated.   Preferred narcotic agents for pain control are hydromorphone, fentanyl, and methadone. Morphine should not be used.  Avoid Baclofen and avoid oral sodium phosphate and magnesium citrate based laxatives / bowel preps.  Continue strict Input and Output monitoring. Will monitor the patient closely with you and intervene or adjust therapy as indicated by changes in clinical status/labs  Sepsis - possibly due to pneumonia as seen on CT scan but also possible UTI per primary svc.  Urine culture with multiple organisms.  Continue with ceftriaxone for now. Acute hypoxic respiratory failure - LLL airspace disease on CT scan.  Continue with supplemental oxygen and abx. HTN - bp on low side and holding amlodipine and ramipril for now. Acute diastolic CHF - reports edema worsening over the last month.  ECHO with preserved EF but unable to evaluate LV diastolic parameters.  Hold off on diuresis for now given AKI.  Thankfully his UOP has significantly improved with renal recovery.  Possible component of proteinuria as well.  UPC 1400 mg/g, SPEP, and UPEP are pending. OSA continue CPAP at bedtime. Hypervolemic hyponatremia - asymptomatic.  Follow for now. Iron deficiency anemia - TSAT 4%.  Hgb dropped to 9.8 and has history of GIB.  Continue to follow and transfuse for Hgb <7 Atrial fibrillation - persistent.  Rate better controlled at this time. Cardiology following.   Benjamin Brands, MD [facility]

## 2023-12-21 NOTE — Progress Notes (Signed)
 PROGRESS NOTE  Evan Berry UJW:[id number] DOB: 02-02-1956 DOA: 12/16/2023 PCP: Aldo Hun, MD  Brief History:  68 year old with a history of persistent atrial fibrillation, hypertension, diabetes mellitus type 2, OSA on CPAP, hepatic steatosis, pancreatic pseudocyst, hypothyroidism, and GERD presenting with generalized weakness and shortness of breath of 1 week duration.  The patient has had some subjective fevers and chills.  He denied any chest pain, coughing, hemoptysis, abdominal pain, nausea, vomiting.  He did have 1 episode of loose stool without any hematochezia or melena.  He has had dysuria for the past 2 weeks.  He has had lower extremity edema for the better part of the last 6 months.  He quit taking his furosemide 6 months ago because he felt like it was not working. In the ED, the patient was febrile up to 103.1 F.  He was tachycardic in 120-130.  He was hemodynamically stable.  Oxygen saturation was 90% on room air.  He was placed on 2 L with saturation up to 95%.  Lactic acid 2.0>> 1.4. The patient was started on ceftriaxone.  He was given 3 L of fluid.    Subjective: The patient was seen and examined this morning, stable no acute distress, off O2 supplements satting 97% Hemodynamically stable in no acute distress    Assessment/Plan:  Sepsis - Resolved sepsis physiology -Secondary to UTI and pneumonia -Improved sepsis physiology, S/P IV fluids, discontinued - Presented with tachycardia and fever - Lactic acid 2.0>> 1.4 - Blood cultures no growth to date -Urine culture multiple species present -Respiratory panel negative - Continue ceftriaxone/azithro>>     Acute diastolic CHF - Started IV furosemide>>stop 6/2 due to uptrending serum creatinine - Accurate I's and O's--incomplete  Intake/Output Summary (Last 24 hours) at 12/21/2023 1157 Last data filed at 12/21/2023 0300 Gross per 24 hour  Intake 720 ml  Output 2600 ml  Net -1880 ml    - Daily  weights 393>> 385 - 12/17/23 Echo--EF 65 to 70%, normal RVF, trivial MR -ReDS = 38>>36   UPC 1400 mg/g, SPEP, and UPEP are pending.    Acute respiratory failure with hypoxia - Much improved, weaned off oxygen, currently on room air satting 97%  - Initially felt to be pulmonary edema and fluid overload, but now more likely multifactorial including pneumonia and OSA/OHS, deconditioning   - COVID/Flu/RSV--neg - check viral resp panel--neg - VBG--7.3 6/39/36/22 -6/2 CT chest--LLL airsp disease with air bronchograms, trace L-effusion   AKI on CKD 3b -Likely ATN per nephrology -  -baseline creatinine 1.1-1.2--June 2024 - likely has progression of underlying CKD - new baseline 1.8-1.9 - serum creatinine up to 2.44>>.  2.14 >> 1.8  Holding diuretics Lasix, amlodipine and ramipril  - appreciate nephrology  Lobar Pneumonia -6/2 CT chest--LLL airsp disease with air bronchograms, trace L-effusion -MRSA neg -continue ceftriaxone and azithro   Pyelonephritis -Asymptomatic much improved - UA >50 WBC - Continue ceftriaxone pending culture data (multiple species present) - Culture unrevealing, but was collected after antibiotics had been initiated -Continue empiric ceftriaxone  - 6/2 CT abd/pelvis--mild symmetric perinephric edema without fluid collection   Essential hypertension - Blood pressures running soft -Holding amlodipine and ramipril    Persistent atrial fibrillation with RVR - Continue apixaban  - Changed to metoprolol  25 mg every 6 hours - TSH--0.61   Uncontrolled diabetes mellitus type 2 with hyperglycemia - 12/17/2023 Hemoglobin A1c--9.1 Long-acting insulin Semglee 10 units  - NovoLog sliding scale  OSA - Continue CPAP   Morbid obesity - BMI 53.43 - Modification   Mixed hyperlipidemia - Continue statin                     Family Communication:  spouse on 6/3   Consultants:  cards   Code Status:  FULL    DVT Prophylaxis:  apixaban       Procedures: As Listed in Progress Note Above   Antibiotics: Ceftriaxone 5/31>> Azithro 6/1>>   Disposition-likely home in a.m.     Objective: Vitals:   12/20/23 2040 12/21/23 0016 12/21/23 0414 12/21/23 0548  BP: (!) 145/84 (!) 165/106 (!) 156/81 (!) 154/97  Pulse: 78 98 85 83  Resp: 17  16   Temp: (!) 97.4 F (36.3 C)  (!) 97.5 F (36.4 C)   TempSrc: Oral  Oral   SpO2: 97%  97%   Weight:   (!) 170 kg   Height:        Intake/Output Summary (Last 24 hours) at 12/21/2023 1157 Last data filed at 12/21/2023 0300 Gross per 24 hour  Intake 720 ml  Output 2600 ml  Net -1880 ml   Weight change: -5.4 kg Exam:         General:  AAO x 3,  cooperative, no distress;   HEENT:  Normocephalic, PERRL, otherwise with in Normal limits   Neuro:  CNII-XII intact. , normal motor and sensation, reflexes intact   Lungs:   Clear to auscultation BL, Respirations unlabored,  No wheezes / crackles  Cardio:    S1/S2, RRR, No murmure, No Rubs or Gallops   Abdomen:  Soft, non-tender, bowel sounds active all four quadrants, no guarding or peritoneal signs.  Muscular  skeletal:  Limited exam -global generalized weaknesses - in bed, able to move all 4 extremities,   2+ pulses,  symmetric, +1  pitting edema  Skin:  Dry, warm to touch, negative for any Rashes,  Wounds: Please see nursing documentation              Rewed: I have personally reviewed following labs and imaging studies Basic Metabolic Panel: Recent Labs  Lab 12/17/23 0737 12/18/23 0319 12/19/23 0520 12/20/23 0446 12/21/23 0412  NA 133* 133* 130* 132* 137  K 3.8 3.4* 3.3* 3.6 3.7  CL 100 103 99 103 102  CO2 21* 21* 22 22 21*  GLUCOSE 173* 199* 167* 209* 233*  BUN 42* 42* 48* 46* 39*  CREATININE 1.92* 2.28* 2.44* 2.14* 1.83*  CALCIUM 8.0* 8.0* 8.0* 8.1* 8.5*  MG 1.8 1.8 1.9  --   --   PHOS 3.0  --   --  3.6  --    Liver Function Tests: Recent Labs  Lab 12/16/23 2147 12/17/23 0737 12/20/23 0446  AST  55* 56*  --   ALT 33 34  --   ALKPHOS 43 39  --   BILITOT 0.8 0.7  --   PROT 6.8 6.4*  --   ALBUMIN 3.1* 2.9* 2.4*   No results for input(s): "LIPASE", "AMYLASE" in the last 168 hours. No results for input(s): "AMMONIA" in the last 168 hours. Coagulation Profile: Recent Labs  Lab 12/16/23 2147  INR 1.7*   CBC: Recent Labs  Lab 12/16/23 2147 12/17/23 0737 12/20/23 0446 12/21/23 0412  WBC 7.4 7.0 4.6 5.9  NEUTROABS 6.5  --   --   --   HGB 11.0* 9.8* 9.6* 9.7*  HCT 34.4* 31.9* 30.7* 32.0*  MCV 78.5* 79.0* 78.5*  76.9*  PLT 145* 137* 134* 188   Cardiac Enzymes: No results for input(s): "CKTOTAL", "CKMB", "CKMBINDEX", "TROPONINI" in the last 168 hours. BNP: Invalid input(s): "POCBNP" CBG: Recent Labs  Lab 12/20/23 1059 12/20/23 1606 12/20/23 2042 12/21/23 0705 12/21/23 1020  GLUCAP 259* 272* 256* 241* 315*   HbA1C: No results for input(s): "HGBA1C" in the last 72 hours.  Urine analysis:    Component Value Date/Time   COLORURINE AMBER (A) 12/17/2023 0250   APPEARANCEUR CLOUDY (A) 12/17/2023 0250   LABSPEC 1.021 12/17/2023 0250   PHURINE 5.0 12/17/2023 0250   GLUCOSEU 50 (A) 12/17/2023 0250   HGBUR LARGE (A) 12/17/2023 0250   BILIRUBINUR NEGATIVE 12/17/2023 0250   KETONESUR NEGATIVE 12/17/2023 0250   PROTEINUR 100 (A) 12/17/2023 0250   NITRITE NEGATIVE 12/17/2023 0250   LEUKOCYTESUR TRACE (A) 12/17/2023 0250   Sepsis Labs: @LABRCNTIP (procalcitonin:4,lacticidven:4) ) Recent Results (from the past 240 hours)  Blood Culture (routine x 2)     Status: None   Collection Time: 12/16/23  9:45 PM   Specimen: Left Antecubital; Blood  Result Value Ref Range Status   Specimen Description LEFT ANTECUBITAL  Final   Special Requests   Final    BOTTLES DRAWN AEROBIC AND ANAEROBIC Blood Culture adequate volume   Culture   Final    NO GROWTH 5 DAYS Performed at [facility], [address], [place], [place] [address]    Report Status 12/21/2023 FINAL  Final  Resp  panel by RT-PCR (RSV, Flu A&B, Covid) Anterior Nasal Swab     Status: None   Collection Time: 12/16/23  9:47 PM   Specimen: Anterior Nasal Swab  Result Value Ref Range Status   SARS Coronavirus 2 by RT PCR NEGATIVE NEGATIVE Final    Comment: (NOTE) SARS-CoV-2 target nucleic acids are NOT DETECTED.  The SARS-CoV-2 RNA is generally detectable in upper respiratory specimens during the acute phase of infection. The lowest concentration of SARS-CoV-2 viral copies this assay can detect is 138 copies/mL. A negative result does not preclude SARS-Cov-2 infection and should not be used as the sole basis for treatment or other patient management decisions. A negative result may occur with  improper specimen collection/handling, submission of specimen other than nasopharyngeal swab, presence of viral mutation(s) within the areas targeted by this assay, and inadequate number of viral copies(<138 copies/mL). A negative result must be combined with clinical observations, patient history, and epidemiological information. The expected result is Negative.  Fact Sheet for Patients:  BloggerCourse.com  Fact Sheet for Healthcare Providers:  SeriousBroker.it  This test is no t yet approved or cleared by the [place]  FDA and  has been authorized for detection and/or diagnosis of SARS-CoV-2 by FDA under an Emergency Use Authorization (EUA). This EUA will remain  in effect (meaning this test can be used) for the duration of the COVID-19 declaration under Section 564(b)(1) of the Act, 21 U.S.C.section 360bbb-3(b)(1), unless the authorization is terminated  or revoked sooner.       Influenza A by PCR NEGATIVE NEGATIVE Final   Influenza B by PCR NEGATIVE NEGATIVE Final    Comment: (NOTE) The Xpert Xpress SARS-CoV-2/FLU/RSV plus assay is intended as an aid in the diagnosis of influenza from Nasopharyngeal swab specimens and should not be used as a  sole basis for treatment. Nasal washings and aspirates are unacceptable for Xpert Xpress SARS-CoV-2/FLU/RSV testing.  Fact Sheet for Patients: BloggerCourse.com  Fact Sheet for Healthcare Providers: SeriousBroker.it  This test is not yet approved or cleared by  the [place]  FDA and has been authorized for detection and/or diagnosis of SARS-CoV-2 by FDA under an Emergency Use Authorization (EUA). This EUA will remain in effect (meaning this test can be used) for the duration of the COVID-19 declaration under Section 564(b)(1) of the Act, 21 U.S.C. section 360bbb-3(b)(1), unless the authorization is terminated or revoked.     Resp Syncytial Virus by PCR NEGATIVE NEGATIVE Final    Comment: (NOTE) Fact Sheet for Patients: BloggerCourse.com  Fact Sheet for Healthcare Providers: SeriousBroker.it  This test is not yet approved or cleared by the [place]  FDA and has been authorized for detection and/or diagnosis of SARS-CoV-2 by FDA under an Emergency Use Authorization (EUA). This EUA will remain in effect (meaning this test can be used) for the duration of the COVID-19 declaration under Section 564(b)(1) of the Act, 21 U.S.C. section 360bbb-3(b)(1), unless the authorization is terminated or revoked.  Performed at [facility], [address], [place], [place] [address]   Blood Culture (routine x 2)     Status: None   Collection Time: 12/16/23 11:28 PM   Specimen: BLOOD LEFT FOREARM  Result Value Ref Range Status   Specimen Description BLOOD LEFT FOREARM  Final   Special Requests   Final    BOTTLES DRAWN AEROBIC AND ANAEROBIC Blood Culture adequate volume   Culture   Final    NO GROWTH 5 DAYS Performed at [facility], [address] [place], [place] [address]    Report Status 12/21/2023 FINAL  Final  Urine Culture     Status: Abnormal   Collection Time: 12/17/23  2:50  AM   Specimen: Urine, Random  Result Value Ref Range Status   Specimen Description   Final    URINE, RANDOM Performed at [facility], [address], [place], [place] [address]    Special Requests   Final    NONE Reflexed from [id number] Performed at [facility], [address], [place], [place] [address]    Culture MULTIPLE SPECIES PRESENT, SUGGEST RECOLLECTION (A)  Final   Report Status 12/18/2023 FINAL  Final  Respiratory (~20 pathogens) panel by PCR     Status: None   Collection Time: 12/17/23  9:12 AM   Specimen: Nasopharyngeal Swab; Respiratory  Result Value Ref Range Status   Adenovirus NOT DETECTED NOT DETECTED Final   Coronavirus 229E NOT DETECTED NOT DETECTED Final    Comment: (NOTE) The Coronavirus on the Respiratory Panel, DOES NOT test for the novel  Coronavirus (2019 nCoV)    Coronavirus HKU1 NOT DETECTED NOT DETECTED Final   Coronavirus NL63 NOT DETECTED NOT DETECTED Final   Coronavirus OC43 NOT DETECTED NOT DETECTED Final   Metapneumovirus NOT DETECTED NOT DETECTED Final   Rhinovirus / Enterovirus NOT DETECTED NOT DETECTED Final   Influenza A NOT DETECTED NOT DETECTED Final   Influenza B NOT DETECTED NOT DETECTED Final   Parainfluenza Virus 1 NOT DETECTED NOT DETECTED Final   Parainfluenza Virus 2 NOT DETECTED NOT DETECTED Final   Parainfluenza Virus 3 NOT DETECTED NOT DETECTED Final   Parainfluenza Virus 4 NOT DETECTED NOT DETECTED Final   Respiratory Syncytial Virus NOT DETECTED NOT DETECTED Final   Bordetella pertussis NOT DETECTED NOT DETECTED Final   Bordetella Parapertussis NOT DETECTED NOT DETECTED Final   Chlamydophila pneumoniae NOT DETECTED NOT DETECTED Final   Mycoplasma pneumoniae NOT DETECTED NOT DETECTED Final    Comment: Performed at [facility], [address], [place], [place] [address]  MRSA Next Gen  by PCR, Nasal     Status: None   Collection Time: 12/19/23 11:55 AM   Specimen: Nasal Mucosa; Nasal Swab  Result Value Ref Range Status    MRSA by PCR Next Gen NOT DETECTED NOT DETECTED Final    Comment: (NOTE) The GeneXpert MRSA Assay (FDA approved for NASAL specimens only), is one component of a comprehensive MRSA colonization surveillance program. It is not intended to diagnose MRSA infection nor to guide or monitor treatment for MRSA infections. Test performance is not FDA approved in patients less than 64 years old. Performed at [facility], [address] [place], [place] [address]      Scheduled Meds:  apixaban   5 mg Oral BID   azithromycin  500 mg Oral Daily   cyanocobalamin  2,500 mcg Oral QHS   ezetimibe  10 mg Oral Daily   insulin aspart  0-9 Units Subcutaneous TID WC   insulin glargine-yfgn  12 Units Subcutaneous Daily   levothyroxine  150 mcg Oral Once per day on Monday Tuesday Wednesday Thursday Friday Saturday   levothyroxine  300 mcg Oral Every Sunday   metoprolol  tartrate  50 mg Oral Q6H   pantoprazole  40 mg Oral Daily   potassium chloride  20 mEq Oral Daily   rosuvastatin  20 mg Oral Daily   sodium chloride  flush  3 mL Intravenous Q12H   Continuous Infusions:  cefTRIAXone (ROCEPHIN)  IV Stopped (12/20/23 2300)    Procedures/Studies: CT CHEST ABDOMEN PELVIS WO CONTRAST Result Date: 12/18/2023 CLINICAL DATA:  UTI, persistent fever, concerned about pyelonephritis, abscess Weakness.  Chills. EXAM: CT CHEST, ABDOMEN AND PELVIS WITHOUT CONTRAST TECHNIQUE: Multidetector CT imaging of the chest, abdomen and pelvis was performed following the standard protocol without IV contrast. RADIATION DOSE REDUCTION: This exam was performed according to the departmental dose-optimization program which includes automated exposure control, adjustment of the mA and/or kV according to patient size and/or use of iterative reconstruction technique. COMPARISON:  Chest radiograph 12/16/2023. Chest, abdomen, pelvic CT 01/07/2023 FINDINGS: CT CHEST FINDINGS Cardiovascular: The heart is upper normal in size. No significant  pericardial effusion. Coronary artery and mitral annulus calcifications. Aortic atherosclerosis without aneurysm. Mediastinum/Nodes: Multiple small mediastinal lymph nodes, not enlarged by size criteria. Hilar assessment is limited in the absence of IV contrast. Mild distal esophageal wall thickening. No visible thyroid  nodule. Lungs/Pleura: Left lower lobe airspace disease standing from the fissure to the pleural surface with air bronchograms, highly suspicious for pneumonia. Surrounding ground-glass opacity throughout the left lower lobe. Trace left pleural effusion. No additional focal airspace disease. The trachea and central airways are clear. Musculoskeletal: Multiple remote right rib fractures. Thoracic spondylosis with anterior spurring. There are no acute or suspicious osseous abnormalities. CT ABDOMEN PELVIS FINDINGS Hepatobiliary: The liver is enlarged spanning 23.6 cm cranial caudal. Diffuse hepatic steatosis. No evidence of focal liver abnormality on this unenhanced exam. Clips in the gallbladder fossa postcholecystectomy. No biliary dilatation. Pancreas: No ductal dilatation or inflammation. Elongated peripherally calcified density adjacent to the pancreatic body and tail is unchanged from prior exam. Spleen: Enlarged, 16 cm AP.  No focal abnormality. Adrenals/Urinary Tract: Left adrenal mass with macroscopic fat measuring 5.4 cm consistent with a benign adrenal myelolipoma. Normal right adrenal gland. No hydronephrosis. Mild symmetric bilateral perinephric edema. There is no obvious renal fluid collection on this unenhanced exam. Soft tissue attenuation from habitus limits detailed assessment. Small right lower pole renal cyst. No visible renal calculi. Unremarkable urinary bladder. Stomach/Bowel: Administered enteric contrast reaches the distal small  bowel and colon. There is no bowel obstruction. No evidence of bowel inflammation. Postsurgical change in the stomach. The appendix is normal.  Vascular/Lymphatic: Normal caliber abdominal aorta with mild atherosclerosis. Left upper quadrant collaterals are unchanged from prior exam. Reactive appearing prominent periportal nodes measuring up to 12 mm, series 2, image 68. Reproductive: Prostate is unremarkable. Other: No ascites or free air. Moderate-sized supraumbilical ventral abdominal wall hernia contains only fat. There is a diminutive fat containing umbilical hernia. Musculoskeletal: Degenerative and postsurgical change in the lumbar spine. IMPRESSION: 1. Left lower lobe airspace disease with air bronchograms, highly suspicious for pneumonia. Trace left pleural effusion. 2. Mild symmetric bilateral perinephric edema, nonspecific. No obvious renal fluid collection on this unenhanced exam. 3. Hepatosplenomegaly with hepatic steatosis. 4. Chronic calcified peripancreatic structure likely a chronically calcified pseudocyst. This is unchanged from prior imaging, and needs no specific imaging follow-up. 5. Mild distal esophageal wall thickening, can be seen with reflux. 6. Moderate-sized supraumbilical ventral abdominal wall hernia contains only fat. Aortic Atherosclerosis (ICD10-I70.0). Electronically Signed   By: Chadwick Colonel M.D.   On: 12/18/2023 19:59   US  Venous Img Lower Bilateral (DVT) Result Date: 12/18/2023 CLINICAL DATA:  Bilateral lower extremity edema EXAM: BILATERAL LOWER EXTREMITY VENOUS DOPPLER ULTRASOUND TECHNIQUE: Gray-scale sonography with graded compression, as well as color Doppler and duplex ultrasound were performed to evaluate the lower extremity deep venous systems from the level of the common femoral vein and including the common femoral, femoral, profunda femoral, popliteal and calf veins including the posterior tibial, peroneal and gastrocnemius veins when visible. The superficial great saphenous vein was also interrogated. Spectral Doppler was utilized to evaluate flow at rest and with distal augmentation maneuvers in the  common femoral, femoral and popliteal veins. COMPARISON:  None Available. FINDINGS: RIGHT LOWER EXTREMITY Common Femoral Vein: No evidence of thrombus. Normal compressibility, respiratory phasicity and response to augmentation. Saphenofemoral Junction: No evidence of thrombus. Normal compressibility and flow on color Doppler imaging. Profunda Femoral Vein: No evidence of thrombus. Normal compressibility and flow on color Doppler imaging. Femoral Vein: No evidence of thrombus. Normal compressibility, respiratory phasicity and response to augmentation. Popliteal Vein: No evidence of thrombus. Normal compressibility, respiratory phasicity and response to augmentation. Calf Veins: No evidence of thrombus. Normal compressibility and flow on color Doppler imaging. Superficial Great Saphenous Vein: No evidence of thrombus. Normal compressibility. Venous Reflux:  None. Other Findings:  None. LEFT LOWER EXTREMITY Common Femoral Vein: No evidence of thrombus. Normal compressibility, respiratory phasicity and response to augmentation. Saphenofemoral Junction: No evidence of thrombus. Normal compressibility and flow on color Doppler imaging. Profunda Femoral Vein: No evidence of thrombus. Normal compressibility and flow on color Doppler imaging. Femoral Vein: No evidence of thrombus. Normal compressibility, respiratory phasicity and response to augmentation. Popliteal Vein: No evidence of thrombus. Normal compressibility, respiratory phasicity and response to augmentation. Calf Veins: No evidence of thrombus. Normal compressibility and flow on color Doppler imaging. Superficial Great Saphenous Vein: No evidence of thrombus. Normal compressibility. Venous Reflux:  None. Other Findings:  None. IMPRESSION: No evidence of deep venous thrombosis in either lower extremity. Electronically Signed   By: Fernando Hoyer M.D.   On: 12/18/2023 15:47   ECHOCARDIOGRAM COMPLETE Result Date: 12/17/2023    ECHOCARDIOGRAM REPORT   Patient  Name:   CARA AGUINO Date of Exam: 12/17/2023 Medical Rec #:  [id number]        Height:       72.0 in Accession #:    [id number]  Weight:       389.6 lb Date of Birth:  06-15-1956         BSA:          2.828 m Patient Age:    76 years         BP:           142/82 mmHg Patient Gender: M                HR:           117 bpm. Exam Location:  Outpatient Procedure: 2D Echo, Color Doppler, Cardiac Doppler and Intracardiac            Opacification Agent (Both Spectral and Color Flow Doppler were            utilized during procedure). Indications:    CHF  History:        Patient has prior history of Echocardiogram examinations, most                 recent 08/17/2022. Arrythmias:Atrial Fibrillation; Risk                 Factors:Hypertension, Diabetes, Dyslipidemia and Former Smoker.  Sonographer:    Gelene Kelly RDCS Referring Phys: [phone] DAVID TAT IMPRESSIONS  1. Left ventricular ejection fraction, by estimation, is 65 to 70%. The left ventricle has normal function. Left ventricular endocardial border not optimally defined to evaluate regional wall motion. There is mild left ventricular hypertrophy. Left ventricular diastolic parameters are indeterminate.  2. RV not well visualized, grossly appears normal in size and function. . Right ventricular systolic function was not well visualized. The right ventricular size is not well visualized. Tricuspid regurgitation signal is inadequate for assessing PA pressure.  3. The mitral valve was not well visualized. Trivial mitral valve regurgitation. No evidence of mitral stenosis.  4. The aortic valve was not well visualized. Aortic valve regurgitation is not visualized. No aortic stenosis is present. FINDINGS  Left Ventricle: Left ventricular ejection fraction, by estimation, is 65 to 70%. The left ventricle has normal function. Left ventricular endocardial border not optimally defined to evaluate regional wall motion. Definity contrast agent was given IV to delineate the left  ventricular endocardial borders. The left ventricular internal cavity size was normal in size. There is mild left ventricular hypertrophy. Left ventricular diastolic parameters are indeterminate. Right Ventricle: RV not well visualized, grossly appears normal in size and function. The right ventricular size is not well visualized. Right vetricular wall thickness was not well visualized. Right ventricular systolic function was not well visualized.  Tricuspid regurgitation signal is inadequate for assessing PA pressure. Left Atrium: Left atrial size was normal in size. Right Atrium: Right atrial size was normal in size. Pericardium: There is no evidence of pericardial effusion. Mitral Valve: The mitral valve was not well visualized. Trivial mitral valve regurgitation. No evidence of mitral valve stenosis. Tricuspid Valve: The tricuspid valve is not well visualized. Tricuspid valve regurgitation is not demonstrated. No evidence of tricuspid stenosis. Aortic Valve: The aortic valve was not well visualized. Aortic valve regurgitation is not visualized. No aortic stenosis is present. Aortic valve mean gradient measures 7.0 mmHg. Aortic valve peak gradient measures 14.0 mmHg. Aortic valve area, by VTI measures 2.06 cm. Pulmonic Valve: The pulmonic valve was not well visualized. Pulmonic valve regurgitation is not visualized. No evidence of pulmonic stenosis. Aorta: The aortic root is normal in size and structure and the ascending aorta was not well visualized. Venous: Not  well visualized. IAS/Shunts: The interatrial septum was not well visualized.  LEFT VENTRICLE PLAX 2D LVIDd:         5.80 cm   Diastology LVIDs:         3.10 cm   LV e' medial:    8.38 cm/s LV PW:         1.20 cm   LV E/e' medial:  13.5 LV IVS:        1.20 cm   LV e' lateral:   9.79 cm/s LVOT diam:     1.95 cm   LV E/e' lateral: 11.5 LV SV:         57 LV SV Index:   20 LVOT Area:     2.99 cm  RIGHT VENTRICLE RV Basal diam:  5.30 cm RV Mid diam:    3.80  cm RV S prime:     22.90 cm/s TAPSE (M-mode): 2.5 cm LEFT ATRIUM              Index        RIGHT ATRIUM           Index LA diam:        5.50 cm  1.94 cm/m   RA Area:     23.40 cm LA Vol (A2C):   56.7 ml  20.05 ml/m  RA Volume:   68.90 ml  24.36 ml/m LA Vol (A4C):   104.0 ml 36.77 ml/m LA Biplane Vol: 83.4 ml  29.49 ml/m  AORTIC VALVE AV Area (Vmax):    1.98 cm AV Area (Vmean):   2.11 cm AV Area (VTI):     2.06 cm AV Vmax:           187.11 cm/s AV Vmean:          122.803 cm/s AV VTI:            0.276 m AV Peak Grad:      14.0 mmHg AV Mean Grad:      7.0 mmHg LVOT Vmax:         124.00 cm/s LVOT Vmean:        86.600 cm/s LVOT VTI:          0.190 m LVOT/AV VTI ratio: 0.69  AORTA Ao Root diam: 2.60 cm MITRAL VALVE MV Area (PHT): 4.06 cm     SHUNTS MV Decel Time: 187 msec     Systemic VTI:  0.19 m MV E velocity: 113.00 cm/s  Systemic Diam: 1.95 cm MV A velocity: 44.80 cm/s MV E/A ratio:  2.52 Armida Lander MD Electronically signed by Armida Lander MD Signature Date/Time: 12/17/2023/12:55:56 PM    Final    DG Chest Port 1 View Result Date: 12/16/2023 EXAM: 1 VIEW XRAY OF THE CHEST 12/16/2023 09:39:00 PM COMPARISON: 01/06/2023 CLINICAL HISTORY: Questionable sepsis - evaluate for abnormality. Patient from home BIB [facility] w/ complaints of weakness since Tuesday which he noticed after he started to come down w/ cold symptoms- reports chills, cold sweats, intermittent difficulty breathing, congestion, cough. Denies sick contacts. Usually is able to ambulate at home, but has been unable to. EMS reports patient was hypoxic on RA on arrival at 90% and placed patient on 3 L of O2 w/ improvement. Patient w/ hx of Afib and HR 80-110 for EMS. Patient febrile 103.1. hx of diabetes, hypertension. Former smoker. FINDINGS: LUNGS AND PLEURA: No focal pulmonary opacity. No pulmonary edema. No pleural effusion. No pneumothorax. HEART AND MEDIASTINUM: The heart is top normal in size. No  acute abnormality of the mediastinal  silhouette. BONES AND SOFT TISSUES: No acute osseous abnormality. IMPRESSION: 1. No acute findings. Electronically signed by: Zadie Herter MD 12/16/2023 09:43 PM EDT RP Workstation: [id number]    Bobbetta Burnet, MD  [facility] Time spent 55 minutes -seeing evaluate patient, reviewing medical records, labs, meds, drawn plan of care   If 7PM-7AM, please contact night-coverage [email or web address] Password [email or web address] 12/21/2023, 11:57 AM   LOS: 4 days

## 2023-12-21 NOTE — Plan of Care (Signed)

## 2023-12-22 DIAGNOSIS — R6510 Systemic inflammatory response syndrome (SIRS) of non-infectious origin without acute organ dysfunction: Secondary | ICD-10-CM | POA: Diagnosis not present

## 2023-12-22 LAB — CBC
HCT: 30.9 % — ABNORMAL LOW (ref 39.0–52.0)
Hemoglobin: 9.6 g/dL — ABNORMAL LOW (ref 13.0–17.0)
MCH: 24.7 pg — ABNORMAL LOW (ref 26.0–34.0)
MCHC: 31.1 g/dL (ref 30.0–36.0)
MCV: 79.4 fL — ABNORMAL LOW (ref 80.0–100.0)
Platelets: 219 10*3/uL (ref 150–400)
RBC: 3.89 MIL/uL — ABNORMAL LOW (ref 4.22–5.81)
RDW: 17.4 % — ABNORMAL HIGH (ref 11.5–15.5)
WBC: 6.9 10*3/uL (ref 4.0–10.5)
nRBC: 0 % (ref 0.0–0.2)

## 2023-12-22 LAB — MULTIPLE MYELOMA PANEL, SERUM
Albumin SerPl Elph-Mcnc: 2.5 g/dL — ABNORMAL LOW (ref 2.9–4.4)
Albumin/Glob SerPl: 0.8 (ref 0.7–1.7)
Alpha 1: 0.3 g/dL (ref 0.0–0.4)
Alpha2 Glob SerPl Elph-Mcnc: 1.4 g/dL — ABNORMAL HIGH (ref 0.4–1.0)
B-Globulin SerPl Elph-Mcnc: 0.8 g/dL (ref 0.7–1.3)
Gamma Glob SerPl Elph-Mcnc: 0.7 g/dL (ref 0.4–1.8)
Globulin, Total: 3.2 g/dL (ref 2.2–3.9)
IgA: <5 mg/dL — ABNORMAL LOW (ref 61–437)
IgG (Immunoglobin G), Serum: 753 mg/dL (ref 603–1613)
IgM (Immunoglobulin M), Srm: 8 mg/dL — ABNORMAL LOW (ref 20–172)
Total Protein ELP: 5.7 g/dL — ABNORMAL LOW (ref 6.0–8.5)

## 2023-12-22 LAB — BASIC METABOLIC PANEL WITH GFR
Anion gap: 11 (ref 5–15)
BUN: 33 mg/dL — ABNORMAL HIGH (ref 8–23)
CO2: 25 mmol/L (ref 22–32)
Calcium: 8.4 mg/dL — ABNORMAL LOW (ref 8.9–10.3)
Chloride: 104 mmol/L (ref 98–111)
Creatinine, Ser: 1.48 mg/dL — ABNORMAL HIGH (ref 0.61–1.24)
GFR, Estimated: 51 mL/min — ABNORMAL LOW (ref >60)
Glucose, Bld: 239 mg/dL — ABNORMAL HIGH (ref 70–99)
Potassium: 3.7 mmol/L (ref 3.5–5.1)
Sodium: 140 mmol/L (ref 135–145)

## 2023-12-22 LAB — GLUCOSE, CAPILLARY
Glucose-Capillary: 265 mg/dL — ABNORMAL HIGH (ref 70–99)
Glucose-Capillary: 274 mg/dL — ABNORMAL HIGH (ref 70–99)

## 2023-12-22 MED ORDER — METOPROLOL SUCCINATE ER 25 MG PO TB24: 100.0000 mg | ORAL_TABLET | Freq: Two times a day (BID) | ORAL | Status: DC

## 2023-12-22 MED ORDER — AMLODIPINE BESYLATE 5 MG PO TABS
5.0000 mg | ORAL_TABLET | Freq: Every day | ORAL | Status: DC
  Administered 2023-12-22: 5 mg via ORAL
  Filled 2023-12-22: qty 1

## 2023-12-22 MED ORDER — LANCETS MISC: 1.0000 | Freq: Three times a day (TID) | 0 refills | Status: AC

## 2023-12-22 MED ORDER — BLOOD GLUCOSE TEST VI STRP: 1.0000 | ORAL_STRIP | Freq: Three times a day (TID) | 0 refills | Status: AC

## 2023-12-22 MED ORDER — METOPROLOL SUCCINATE ER 100 MG PO TB24: 100.0000 mg | ORAL_TABLET | Freq: Two times a day (BID) | ORAL | 2 refills | Status: AC

## 2023-12-22 MED ORDER — LANCET DEVICE MISC: 1.0000 | Freq: Three times a day (TID) | 0 refills | Status: AC

## 2023-12-22 MED ORDER — INSULIN GLARGINE-YFGN 100 UNIT/ML ~~LOC~~ SOLN: 10.0000 [IU] | Freq: Every day | SUBCUTANEOUS | 11 refills | Status: AC

## 2023-12-22 MED ORDER — BLOOD GLUCOSE MONITORING SUPPL DEVI: 1.0000 | Freq: Three times a day (TID) | 0 refills | Status: AC

## 2023-12-22 NOTE — Progress Notes (Signed)
 Rounding Note   Patient Name: Evan Berry Date of Encounter: 12/22/2023  [facility] Cardiologist: Nahser  Subjective No complaints  Scheduled Meds:  amLODipine  5 mg Oral Daily   apixaban   5 mg Oral BID   azithromycin  500 mg Oral Daily   cyanocobalamin  2,500 mcg Oral QHS   ezetimibe  10 mg Oral Daily   insulin aspart  0-5 Units Subcutaneous QHS   insulin aspart  0-9 Units Subcutaneous TID WC   insulin glargine-yfgn  12 Units Subcutaneous Daily   levothyroxine  150 mcg Oral Once per day on Monday Tuesday Wednesday Thursday Friday Saturday   levothyroxine  300 mcg Oral Every Sunday   metoprolol  tartrate  50 mg Oral Q6H   pantoprazole  40 mg Oral Daily   potassium chloride  20 mEq Oral Daily   rosuvastatin  20 mg Oral Daily   sodium chloride  flush  3 mL Intravenous Q12H   Continuous Infusions:  cefTRIAXone (ROCEPHIN)  IV 2 g (12/21/23 2113)   PRN Meds: acetaminophen  **OR** acetaminophen , acetaminophen , levalbuterol, ondansetron  **OR** ondansetron  (ZOFRAN ) IV, sodium chloride  flush   Vital Signs  Vitals:   12/21/23 2030 12/21/23 2242 12/22/23 0414 12/22/23 0500  BP: (!) 164/100 (!) 163/103 (!) 157/90   Pulse: 82 91 75   Resp: 18 17 18    Temp: 97.7 F (36.5 C) 97.8 F (36.6 C) 97.8 F (36.6 C)   TempSrc: Oral Oral    SpO2: 96% 95% 96%   Weight:    (!) 171.1 kg  Height:        Intake/Output Summary (Last 24 hours) at 12/22/2023 0947 Last data filed at 12/22/2023 0500 Gross per 24 hour  Intake 460 ml  Output 1550 ml  Net -1090 ml      12/22/2023    5:00 AM 12/21/2023    4:14 AM 12/20/2023    5:18 AM  Last 3 Weights  Weight (lbs) 377 lb 3.3 oz 374 lb 12.5 oz 386 lb 11 oz  Weight (kg) 171.1 kg 170 kg 175.4 kg      Telemetry Rate contorlled afib - Personally Reviewed  ECG  N/a - Personally Reviewed  Physical Exam  GEN: No acute distress.   Neck: No JVD Cardiac: irreg Respiratory: Clear to auscultation bilaterally. GI: 1+ bilateral LE  edema MS: No edema; No deformity. Neuro:  Nonfocal  Psych: Normal affect   Labs High Sensitivity Troponin:  No results for input(s): "TROPONINIHS" in the last 720 hours.   Chemistry Recent Labs  Lab 12/16/23 2147 12/16/23 2147 12/17/23 0737 12/18/23 0319 12/19/23 0520 12/20/23 0446 12/21/23 0412 12/22/23 0509  NA 133*  --  133* 133* 130* 132* 137 140  K 4.2  --  3.8 3.4* 3.3* 3.6 3.7 3.7  CL 99  --  100 103 99 103 102 104  CO2 21*  --  21* 21* 22 22 21* 25  GLUCOSE 228*  --  173* 199* 167* 209* 233* 239*  BUN 42*  --  42* 42* 48* 46* 39* 33*  CREATININE 1.89*  --  1.92* 2.28* 2.44* 2.14* 1.83* 1.48*  CALCIUM 8.5*  --  8.0* 8.0* 8.0* 8.1* 8.5* 8.4*  MG  --    < > 1.8 1.8 1.9  --  2.0  --   PROT 6.8  --  6.4*  --   --   --   --   --   ALBUMIN 3.1*  --  2.9*  --   --  2.4*  --   --   AST 55*  --  56*  --   --   --   --   --   ALT 33  --  34  --   --   --   --   --   ALKPHOS 43  --  39  --   --   --   --   --   BILITOT 0.8  --  0.7  --   --   --   --   --   GFRNONAA 38*  --  37* 30* 28* 33* 39* 51*  ANIONGAP 13  --  12 9 9 7 14 11    < > = values in this interval not displayed.    Lipids No results for input(s): "CHOL", "TRIG", "HDL", "LABVLDL", "LDLCALC", "CHOLHDL" in the last 168 hours.  Hematology Recent Labs  Lab 12/20/23 0446 12/21/23 0412 12/22/23 0509  WBC 4.6 5.9 6.9  RBC 3.91* 4.16* 3.89*  HGB 9.6* 9.7* 9.6*  HCT 30.7* 32.0* 30.9*  MCV 78.5* 76.9* 79.4*  MCH 24.6* 23.3* 24.7*  MCHC 31.3 30.3 31.1  RDW 17.1* 17.2* 17.4*  PLT 134* 188 219   Thyroid   Recent Labs  Lab 12/18/23 0319  TSH 0.861    BNP Recent Labs  Lab 12/17/23 0848  BNP 75.0    DDimer No results for input(s): "DDIMER" in the last 168 hours.   Radiology  No results found.  Cardiac Studies   Patient Profile   Evan Berry is a 68 y.o. male with a hx of OSA, longstanding persistent afib, HTN, CKD, DM who is being seen 12/20/2023 for the evaluation of afib at the request of Dr  Tat.   Assessment & Plan  Chronic HFpEF - 12/2023 echo: LVE 65-70%, indet diastolic function, RV not well visualized groslly appears normal in size and function.  CXR no acute process, BNP 75 CT C/A/P: LLL airspace disease, probable pneumonia.  - received IV lasix, significant uptrend in Cr and discontinued. AKI is improving - reds vest 36%.    - he reports chronic edema, current level similar to chronic he reports. edema may be more obesity related, venous insufficiency. BMI 52. Uptrend in Cr with attempt diuresis. CT withut significant edema, BNP normal. Echo some limitation in visualization but normal LVEF. Cannot determine diastolic function in setting of afib but normal LA size indicates no severe longstanding dysfunction. No JVD, lungs without significant crackles. Sats 97% on RA - continue to hold on any additional diuresis at this time. Defer any additional diuretic to nephrology     2. Longstanding persistent afib - prior DCCV with recurrent afib shortly after. DId not feel any different in SR, has been managed with rate control as outpatient - home regimen lopressor  25mg  bid, eliquis  5mg  bid - afib with elevated rates in setting of pneumonia, hypoxia, pyelo, sepsis    - on lopressor  50mg  qid, consolidate to toprol  100mg  bid  - rates should improve as systemic illness resolves     3.UTI/pneumonia/sepsis - per primary team     4.AKI on CKD - admit Cr 1.89, baseline 1.1-1.3 - further increase in Cr to 2.4 - followed by nephrology   5.Hypoxia - primarily related to pneumonia -CT C/A/P: LLL airspace disease, probable pneumonia. -off O2, RA this morning sats 97%   6.HTN - restart home norvasc today, continue to hold home ramipril with resloving AKI.    No additional cardiology recs at this  time we will sign off inpatient care and arrange outpatient f/u    For questions or updates, please contact  [facility] Please consult [email or web address] for contact info under      Signed, Armida Lander, MD  12/22/2023, 9:47 AM

## 2023-12-22 NOTE — Progress Notes (Signed)
 Patient ID: Evan Berry, male   DOB: 22-Sep-1955, 68 y.o.   MRN: [id number]  Assessment/Plan:  AKI/CKD stage III - presumably ischemic ATN in the setting of sepsis/hypotension, volume depletion, and concomitant ACE inhibition.  No hydronephrosis on CT scan.  Scr and UOP have continued to improve after IVF's  No indication for dialysis at this time.   - Should be able to restart the metformin and ramipril.  He will need to follow up with our practice in 3-4 weeks after discharge; we will set that up and have already notified the office to call the patient.  Not far off his baseline renal function and BP higher as well.  Will not be physically seeing the patient this weekend unless there are unforeseen changes and will monitor remotely. Dr. Lydia Sams will be covering AP this weekend.    Avoid nephrotoxic medications including NSAIDs and iodinated intravenous contrast exposure unless the latter is absolutely indicated.   Preferred narcotic agents for pain control are hydromorphone, fentanyl, and methadone. Morphine should not be used.  Avoid Baclofen and avoid oral sodium phosphate and magnesium citrate based laxatives / bowel preps.  Continue strict Input and Output monitoring. Will monitor the patient closely with you and intervene or adjust therapy as indicated by changes in clinical status/labs  Sepsis - possibly due to pneumonia as seen on CT scan but also possible UTI per primary svc.  Urine culture with multiple organisms.  Continue with ceftriaxone for now. Acute hypoxic respiratory failure - LLL airspace disease on CT scan.  Continue with supplemental oxygen and abx. HTN - bp on low side and holding amlodipine and ramipril for now. Acute diastolic CHF - reports edema worsening over the last month.  ECHO with preserved EF but unable to evaluate LV diastolic parameters.  Hold off on diuresis for now given AKI.  Thankfully his UOP has significantly improved with renal recovery.  Possible component of  proteinuria as well.  UPC 1400 mg/g, SPEP no presence of paraprotein. - Of note he is supposed to be on Lasix daily but has not been taking it routinely at home.  He states that he does not like having to use the restroom after the Lasix.  I counseled and educated him that he needs to be on it, daily weights in the morning after using the restroom.  Eventually may even need twice a day dosing, restrict salt intake as well  Hypervolemic hyponatremia - asymptomatic.  Follow for now. Iron deficiency anemia - TSAT 4%.  Hgb dropped to 9.8 and has history of GIB.  Continue to follow and transfuse for Hgb <7 Atrial fibrillation - persistent.  Rate better controlled at this time. Cardiology following.   S: Feeling better and asking when he can go home.  O:BP (!) 157/90   Pulse 75   Temp 97.8 F (36.6 C)   Resp 18   Ht 6' (1.829 m)   Wt (!) 171.1 kg   SpO2 96%   BMI 51.16 kg/m   Intake/Output Summary (Last 24 hours) at 12/22/2023 1048 Last data filed at 12/22/2023 1012 Gross per 24 hour  Intake 700 ml  Output 1550 ml  Net -850 ml   Intake/Output: I/O last 3 completed shifts: In: 820 [P.O.:720; IV Piggyback:100] Out: 3150 [Urine:3150]  Intake/Output this shift:  Total I/O In: 240 [P.O.:240] Out: -  Weight change: 1.1 kg Gen: NAD CVS: RRR Resp:Crackles at LLL field Abd: obese, +BS, soft, NT/ND Ext: 1+ pretibial edema bilaterally  Recent Labs  Lab 12/16/23 2147 12/17/23 0737 12/18/23 0319 12/19/23 0520 12/20/23 0446 12/21/23 0412 12/22/23 0509  NA 133* 133* 133* 130* 132* 137 140  K 4.2 3.8 3.4* 3.3* 3.6 3.7 3.7  CL 99 100 103 99 103 102 104  CO2 21* 21* 21* 22 22 21* 25  GLUCOSE 228* 173* 199* 167* 209* 233* 239*  BUN 42* 42* 42* 48* 46* 39* 33*  CREATININE 1.89* 1.92* 2.28* 2.44* 2.14* 1.83* 1.48*  ALBUMIN 3.1* 2.9*  --   --  2.4*  --   --   CALCIUM 8.5* 8.0* 8.0* 8.0* 8.1* 8.5* 8.4*  PHOS  --  3.0  --   --  3.6  --   --   AST 55* 56*  --   --   --   --   --   ALT 33  34  --   --   --   --   --    Liver Function Tests: Recent Labs  Lab 12/16/23 2147 12/17/23 0737 12/20/23 0446  AST 55* 56*  --   ALT 33 34  --   ALKPHOS 43 39  --   BILITOT 0.8 0.7  --   PROT 6.8 6.4*  --   ALBUMIN 3.1* 2.9* 2.4*   No results for input(s): "LIPASE", "AMYLASE" in the last 168 hours. No results for input(s): "AMMONIA" in the last 168 hours. CBC: Recent Labs  Lab 12/16/23 2147 12/17/23 0737 12/20/23 0446 12/21/23 0412 12/22/23 0509  WBC 7.4 7.0 4.6 5.9 6.9  NEUTROABS 6.5  --   --   --   --   HGB 11.0* 9.8* 9.6* 9.7* 9.6*  HCT 34.4* 31.9* 30.7* 32.0* 30.9*  MCV 78.5* 79.0* 78.5* 76.9* 79.4*  PLT 145* 137* 134* 188 219   Cardiac Enzymes: No results for input(s): "CKTOTAL", "CKMB", "CKMBINDEX", "TROPONINI" in the last 168 hours. CBG: Recent Labs  Lab 12/21/23 0705 12/21/23 1020 12/21/23 1624 12/21/23 2033 12/22/23 0743  GLUCAP 241* 315* 319* 284* 265*    Iron Studies: No results for input(s): "IRON", "TIBC", "TRANSFERRIN", "FERRITIN" in the last 72 hours. Studies/Results: No results found.  amLODipine  5 mg Oral Daily   apixaban   5 mg Oral BID   azithromycin  500 mg Oral Daily   cyanocobalamin  2,500 mcg Oral QHS   ezetimibe  10 mg Oral Daily   insulin aspart  0-5 Units Subcutaneous QHS   insulin aspart  0-9 Units Subcutaneous TID WC   insulin glargine-yfgn  12 Units Subcutaneous Daily   levothyroxine  150 mcg Oral Once per day on Monday Tuesday Wednesday Thursday Friday Saturday   levothyroxine  300 mcg Oral Every Sunday   metoprolol  succinate  100 mg Oral BID   metoprolol  tartrate  50 mg Oral Q6H   pantoprazole  40 mg Oral Daily   potassium chloride  20 mEq Oral Daily   rosuvastatin  20 mg Oral Daily   sodium chloride  flush  3 mL Intravenous Q12H    BMET    Component Value Date/Time   NA 140 12/22/2023 0509   K 3.7 12/22/2023 0509   CL 104 12/22/2023 0509   CO2 25 12/22/2023 0509   GLUCOSE 239 (H) 12/22/2023 0509   BUN 33 (H)  12/22/2023 0509   CREATININE 1.48 (H) 12/22/2023 0509   CALCIUM 8.4 (L) 12/22/2023 0509   GFRNONAA 51 (L) 12/22/2023 0509   GFRAA >60 01/21/2020 0859   CBC    Component Value Date/Time   WBC  6.9 12/22/2023 0509   RBC 3.89 (L) 12/22/2023 0509   HGB 9.6 (L) 12/22/2023 0509   HCT 30.9 (L) 12/22/2023 0509   PLT 219 12/22/2023 0509   MCV 79.4 (L) 12/22/2023 0509   MCH 24.7 (L) 12/22/2023 0509   MCHC 31.1 12/22/2023 0509   RDW 17.4 (H) 12/22/2023 0509   LYMPHSABS 0.3 (L) 12/16/2023 2147   MONOABS 0.6 12/16/2023 2147   EOSABS 0.0 12/16/2023 2147   BASOSABS 0.0 12/16/2023 2147

## 2023-12-22 NOTE — Care Management Important Message (Signed)
 Important Message  Patient Details  Name: Evan Berry MRN: [id number] Date of Birth: [birth year]   Important Message Given:  Yes - Medicare IM (late entry, copy provided)     Hanni Milford L Jakari Sada 12/22/2023, 2:40 PM

## 2023-12-22 NOTE — Discharge Summary (Signed)
 Physician Discharge Summary   Patient: Evan Berry MRN: [id number] DOB: [birth year]  Admit date:     12/16/2023  Discharge date: 12/22/23  Discharge Physician: Bobbetta Burnet   PCP: Aldo Hun, MD   Recommendations at discharge:   Follow-up with PCP in 1 week- (BMP-monitor kidney function in 1 week) Tighter glycemic control recommended current adjustment of home meds per PCP May need a referral to weight loss clinic Follow-up with cardiology 2-4 weeks Continue healthy cardiac/diabetic diet  Discharge Diagnoses: Principal Problem:   SIRS (systemic inflammatory response syndrome) (HCC) Active Problems:   Acute heart failure with preserved ejection fraction (HFpEF) (HCC)   Sepsis due to undetermined organism (HCC)   Acute respiratory failure with hypoxia (HCC)   Obesity, Class III, BMI 40-49.9 (morbid obesity)   Lobar pneumonia (HCC)   AKI (acute kidney injury) (HCC)  Resolved Problems:   * No resolved hospital problems. *  Hospital Course: 68 year old with a history of persistent atrial fibrillation, hypertension, diabetes mellitus type 2, OSA on CPAP, hepatic steatosis, pancreatic pseudocyst, hypothyroidism, and GERD presenting with generalized weakness and shortness of breath of 1 week duration.  The patient has had some subjective fevers and chills.  He denied any chest pain, coughing, hemoptysis, abdominal pain, nausea, vomiting.  He did have 1 episode of loose stool without any hematochezia or melena.  He has had dysuria for the past 2 weeks.  He has had lower extremity edema for the better part of the last 6 months.  He quit taking his furosemide 6 months ago because he felt like it was not working. In the ED, the patient was febrile up to 103.1 F.  He was tachycardic in 120-130.  He was hemodynamically stable.  Oxygen saturation was 90% on room air.  He was placed on 2 L with saturation up to 95%.  Lactic acid 2.0>> 1.4. The patient was started on ceftriaxone.  He was  given 3 L of fluid.        DISCHARGE MEDICATION: Allergies as of 12/22/2023       Reactions   Penicillins Other (See Comments)   Can't remember reaction, childhood allergy         Medication List     STOP taking these medications    Alpha-Lipoic Acid 300 MG Caps   diclofenac Sodium 1 % Gel Commonly known as: VOLTAREN   metFORMIN 500 MG 24 hr tablet Commonly known as: GLUCOPHAGE-XR   metoprolol  tartrate 25 MG tablet Commonly known as: LOPRESSOR    Nasacort Allergy 24HR 55 MCG/ACT Aero nasal inhaler Generic drug: triamcinolone   ramipril 10 MG capsule Commonly known as: ALTACE       TAKE these medications    acetaminophen  650 MG CR tablet Commonly known as: TYLENOL  Take 1,950 mg by mouth at bedtime.   amLODipine 5 MG tablet Commonly known as: NORVASC Take 5 mg by mouth daily.   apixaban  5 MG Tabs tablet Commonly known as: Eliquis  Take 1 tablet (5 mg total) by mouth 2 (two) times daily. appt req for refill [id number]   B-12 2500 MCG Tabs Take 2,500 mcg by mouth at bedtime.   Blood Glucose Monitoring Suppl Devi 1 each by Does not apply route 3 (three) times daily. May dispense any manufacturer covered by patient's insurance.   BLOOD GLUCOSE TEST STRIPS Strp 1 each by Does not apply route 3 (three) times daily. Use as directed to check blood sugar. May dispense any manufacturer covered by patient's insurance and fits patient's  device.   ezetimibe 10 MG tablet Commonly known as: ZETIA Take 10 mg by mouth daily.   glimepiride 4 MG tablet Commonly known as: AMARYL Take 4 mg by mouth daily with breakfast.   HYDROcodone -acetaminophen  10-325 MG tablet Commonly known as: Norco Take 1 tablet by mouth every 6 (six) hours as needed for severe pain or moderate pain.   insulin glargine-yfgn 100 UNIT/ML injection Commonly known as: SEMGLEE Inject 0.1 mLs (10 Units total) into the skin daily. Start taking on: December 23, 2023   Lancet Device Misc 1 each by  Does not apply route 3 (three) times daily. May dispense any manufacturer covered by patient's insurance.   Lancets Misc 1 each by Does not apply route 3 (three) times daily. Use as directed to check blood sugar. May dispense any manufacturer covered by patient's insurance and fits patient's device.   levothyroxine 150 MCG tablet Commonly known as: SYNTHROID Take 150-300 mcg by mouth See admin instructions.  Take 300 mg at bedtime on Sunday. All the other days take 150 mg at bedtime   metoprolol  succinate 100 MG 24 hr tablet Commonly known as: TOPROL -XL Take 1 tablet (100 mg total) by mouth 2 (two) times daily.   multivitamin capsule Take 1 capsule by mouth at bedtime.   omeprazole  20 MG capsule Commonly known as: PRILOSEC TAKE 1 CAPSULE (20 MG TOTAL) BY MOUTH 2 (TWO) TIMES DAILY BEFORE A MEAL.   rosuvastatin 20 MG tablet Commonly known as: CRESTOR Take 20 mg by mouth daily.        Follow-up Information     Jude Norton, NP Follow up on 01/05/2024.   Specialties: Cardiology, Family Medicine Why: Cardiology Hospital Follow-up on 01/05/2024 at 2:20 PM. Contact information: [address] [place] [address] [phone]                Discharge Exam: Cleavon Curls Weights   12/20/23 0518 12/21/23 0414 12/22/23 0500  Weight: (!) 175.4 kg (!) 170 kg (!) 171.1 kg        General:  AAO x 3,  cooperative, no distress;   HEENT:  Normocephalic, PERRL, otherwise with in Normal limits   Neuro:  CNII-XII intact. , normal motor and sensation, reflexes intact   Lungs:   Clear to auscultation BL, Respirations unlabored,  No wheezes / crackles  Cardio:    S1/S2, RRR, No murmure, No Rubs or Gallops   Abdomen:  Soft, non-tender, bowel sounds active all four quadrants, no guarding or peritoneal signs.  Muscular  skeletal:  Limited exam -global generalized weaknesses - in bed, able to move all 4 extremities,   2+ pulses,  symmetric, No pitting edema  Skin:  Dry, warm to  touch, negative for any Rashes,  Wounds: Please see nursing documentation          Condition at discharge: good  The results of significant diagnostics from this hospitalization (including imaging, microbiology, ancillary and laboratory) are listed below for reference.   Imaging Studies: CT CHEST ABDOMEN PELVIS WO CONTRAST Result Date: 12/18/2023 CLINICAL DATA:  UTI, persistent fever, concerned about pyelonephritis, abscess Weakness.  Chills. EXAM: CT CHEST, ABDOMEN AND PELVIS WITHOUT CONTRAST TECHNIQUE: Multidetector CT imaging of the chest, abdomen and pelvis was performed following the standard protocol without IV contrast. RADIATION DOSE REDUCTION: This exam was performed according to the departmental dose-optimization program which includes automated exposure control, adjustment of the mA and/or kV according to patient size and/or use of iterative reconstruction technique. COMPARISON:  Chest radiograph 12/16/2023.  Chest, abdomen, pelvic CT 01/07/2023 FINDINGS: CT CHEST FINDINGS Cardiovascular: The heart is upper normal in size. No significant pericardial effusion. Coronary artery and mitral annulus calcifications. Aortic atherosclerosis without aneurysm. Mediastinum/Nodes: Multiple small mediastinal lymph nodes, not enlarged by size criteria. Hilar assessment is limited in the absence of IV contrast. Mild distal esophageal wall thickening. No visible thyroid  nodule. Lungs/Pleura: Left lower lobe airspace disease standing from the fissure to the pleural surface with air bronchograms, highly suspicious for pneumonia. Surrounding ground-glass opacity throughout the left lower lobe. Trace left pleural effusion. No additional focal airspace disease. The trachea and central airways are clear. Musculoskeletal: Multiple remote right rib fractures. Thoracic spondylosis with anterior spurring. There are no acute or suspicious osseous abnormalities. CT ABDOMEN PELVIS FINDINGS Hepatobiliary: The liver is  enlarged spanning 23.6 cm cranial caudal. Diffuse hepatic steatosis. No evidence of focal liver abnormality on this unenhanced exam. Clips in the gallbladder fossa postcholecystectomy. No biliary dilatation. Pancreas: No ductal dilatation or inflammation. Elongated peripherally calcified density adjacent to the pancreatic body and tail is unchanged from prior exam. Spleen: Enlarged, 16 cm AP.  No focal abnormality. Adrenals/Urinary Tract: Left adrenal mass with macroscopic fat measuring 5.4 cm consistent with a benign adrenal myelolipoma. Normal right adrenal gland. No hydronephrosis. Mild symmetric bilateral perinephric edema. There is no obvious renal fluid collection on this unenhanced exam. Soft tissue attenuation from habitus limits detailed assessment. Small right lower pole renal cyst. No visible renal calculi. Unremarkable urinary bladder. Stomach/Bowel: Administered enteric contrast reaches the distal small bowel and colon. There is no bowel obstruction. No evidence of bowel inflammation. Postsurgical change in the stomach. The appendix is normal. Vascular/Lymphatic: Normal caliber abdominal aorta with mild atherosclerosis. Left upper quadrant collaterals are unchanged from prior exam. Reactive appearing prominent periportal nodes measuring up to 12 mm, series 2, image 68. Reproductive: Prostate is unremarkable. Other: No ascites or free air. Moderate-sized supraumbilical ventral abdominal wall hernia contains only fat. There is a diminutive fat containing umbilical hernia. Musculoskeletal: Degenerative and postsurgical change in the lumbar spine. IMPRESSION: 1. Left lower lobe airspace disease with air bronchograms, highly suspicious for pneumonia. Trace left pleural effusion. 2. Mild symmetric bilateral perinephric edema, nonspecific. No obvious renal fluid collection on this unenhanced exam. 3. Hepatosplenomegaly with hepatic steatosis. 4. Chronic calcified peripancreatic structure likely a chronically  calcified pseudocyst. This is unchanged from prior imaging, and needs no specific imaging follow-up. 5. Mild distal esophageal wall thickening, can be seen with reflux. 6. Moderate-sized supraumbilical ventral abdominal wall hernia contains only fat. Aortic Atherosclerosis (ICD10-I70.0). Electronically Signed   By: Chadwick Colonel M.D.   On: 12/18/2023 19:59   US  Venous Img Lower Bilateral (DVT) Result Date: 12/18/2023 CLINICAL DATA:  Bilateral lower extremity edema EXAM: BILATERAL LOWER EXTREMITY VENOUS DOPPLER ULTRASOUND TECHNIQUE: Gray-scale sonography with graded compression, as well as color Doppler and duplex ultrasound were performed to evaluate the lower extremity deep venous systems from the level of the common femoral vein and including the common femoral, femoral, profunda femoral, popliteal and calf veins including the posterior tibial, peroneal and gastrocnemius veins when visible. The superficial great saphenous vein was also interrogated. Spectral Doppler was utilized to evaluate flow at rest and with distal augmentation maneuvers in the common femoral, femoral and popliteal veins. COMPARISON:  None Available. FINDINGS: RIGHT LOWER EXTREMITY Common Femoral Vein: No evidence of thrombus. Normal compressibility, respiratory phasicity and response to augmentation. Saphenofemoral Junction: No evidence of thrombus. Normal compressibility and flow on color Doppler imaging. Profunda Femoral Vein: No  evidence of thrombus. Normal compressibility and flow on color Doppler imaging. Femoral Vein: No evidence of thrombus. Normal compressibility, respiratory phasicity and response to augmentation. Popliteal Vein: No evidence of thrombus. Normal compressibility, respiratory phasicity and response to augmentation. Calf Veins: No evidence of thrombus. Normal compressibility and flow on color Doppler imaging. Superficial Great Saphenous Vein: No evidence of thrombus. Normal compressibility. Venous Reflux:  None.  Other Findings:  None. LEFT LOWER EXTREMITY Common Femoral Vein: No evidence of thrombus. Normal compressibility, respiratory phasicity and response to augmentation. Saphenofemoral Junction: No evidence of thrombus. Normal compressibility and flow on color Doppler imaging. Profunda Femoral Vein: No evidence of thrombus. Normal compressibility and flow on color Doppler imaging. Femoral Vein: No evidence of thrombus. Normal compressibility, respiratory phasicity and response to augmentation. Popliteal Vein: No evidence of thrombus. Normal compressibility, respiratory phasicity and response to augmentation. Calf Veins: No evidence of thrombus. Normal compressibility and flow on color Doppler imaging. Superficial Great Saphenous Vein: No evidence of thrombus. Normal compressibility. Venous Reflux:  None. Other Findings:  None. IMPRESSION: No evidence of deep venous thrombosis in either lower extremity. Electronically Signed   By: Fernando Hoyer M.D.   On: 12/18/2023 15:47   ECHOCARDIOGRAM COMPLETE Result Date: 12/17/2023    ECHOCARDIOGRAM REPORT   Patient Name:   JOSEDE CICERO Date of Exam: 12/17/2023 Medical Rec #:  [id number]        Height:       72.0 in Accession #:    [id number]       Weight:       389.6 lb Date of Birth:  July 31, 1955         BSA:          2.828 m Patient Age:    67 years         BP:           142/82 mmHg Patient Gender: M                HR:           117 bpm. Exam Location:  Outpatient Procedure: 2D Echo, Color Doppler, Cardiac Doppler and Intracardiac            Opacification Agent (Both Spectral and Color Flow Doppler were            utilized during procedure). Indications:    CHF  History:        Patient has prior history of Echocardiogram examinations, most                 recent 08/17/2022. Arrythmias:Atrial Fibrillation; Risk                 Factors:Hypertension, Diabetes, Dyslipidemia and Former Smoker.  Sonographer:    Gelene Kelly RDCS Referring Phys: [phone] DAVID TAT IMPRESSIONS  1.  Left ventricular ejection fraction, by estimation, is 65 to 70%. The left ventricle has normal function. Left ventricular endocardial border not optimally defined to evaluate regional wall motion. There is mild left ventricular hypertrophy. Left ventricular diastolic parameters are indeterminate.  2. RV not well visualized, grossly appears normal in size and function. . Right ventricular systolic function was not well visualized. The right ventricular size is not well visualized. Tricuspid regurgitation signal is inadequate for assessing PA pressure.  3. The mitral valve was not well visualized. Trivial mitral valve regurgitation. No evidence of mitral stenosis.  4. The aortic valve was not well visualized. Aortic valve regurgitation is not visualized. No aortic stenosis is  present. FINDINGS  Left Ventricle: Left ventricular ejection fraction, by estimation, is 65 to 70%. The left ventricle has normal function. Left ventricular endocardial border not optimally defined to evaluate regional wall motion. Definity contrast agent was given IV to delineate the left ventricular endocardial borders. The left ventricular internal cavity size was normal in size. There is mild left ventricular hypertrophy. Left ventricular diastolic parameters are indeterminate. Right Ventricle: RV not well visualized, grossly appears normal in size and function. The right ventricular size is not well visualized. Right vetricular wall thickness was not well visualized. Right ventricular systolic function was not well visualized.  Tricuspid regurgitation signal is inadequate for assessing PA pressure. Left Atrium: Left atrial size was normal in size. Right Atrium: Right atrial size was normal in size. Pericardium: There is no evidence of pericardial effusion. Mitral Valve: The mitral valve was not well visualized. Trivial mitral valve regurgitation. No evidence of mitral valve stenosis. Tricuspid Valve: The tricuspid valve is not well  visualized. Tricuspid valve regurgitation is not demonstrated. No evidence of tricuspid stenosis. Aortic Valve: The aortic valve was not well visualized. Aortic valve regurgitation is not visualized. No aortic stenosis is present. Aortic valve mean gradient measures 7.0 mmHg. Aortic valve peak gradient measures 14.0 mmHg. Aortic valve area, by VTI measures 2.06 cm. Pulmonic Valve: The pulmonic valve was not well visualized. Pulmonic valve regurgitation is not visualized. No evidence of pulmonic stenosis. Aorta: The aortic root is normal in size and structure and the ascending aorta was not well visualized. Venous: Not well visualized. IAS/Shunts: The interatrial septum was not well visualized.  LEFT VENTRICLE PLAX 2D LVIDd:         5.80 cm   Diastology LVIDs:         3.10 cm   LV e' medial:    8.38 cm/s LV PW:         1.20 cm   LV E/e' medial:  13.5 LV IVS:        1.20 cm   LV e' lateral:   9.79 cm/s LVOT diam:     1.95 cm   LV E/e' lateral: 11.5 LV SV:         57 LV SV Index:   20 LVOT Area:     2.99 cm  RIGHT VENTRICLE RV Basal diam:  5.30 cm RV Mid diam:    3.80 cm RV S prime:     22.90 cm/s TAPSE (M-mode): 2.5 cm LEFT ATRIUM              Index        RIGHT ATRIUM           Index LA diam:        5.50 cm  1.94 cm/m   RA Area:     23.40 cm LA Vol (A2C):   56.7 ml  20.05 ml/m  RA Volume:   68.90 ml  24.36 ml/m LA Vol (A4C):   104.0 ml 36.77 ml/m LA Biplane Vol: 83.4 ml  29.49 ml/m  AORTIC VALVE AV Area (Vmax):    1.98 cm AV Area (Vmean):   2.11 cm AV Area (VTI):     2.06 cm AV Vmax:           187.11 cm/s AV Vmean:          122.803 cm/s AV VTI:            0.276 m AV Peak Grad:      14.0 mmHg AV Mean Grad:  7.0 mmHg LVOT Vmax:         124.00 cm/s LVOT Vmean:        86.600 cm/s LVOT VTI:          0.190 m LVOT/AV VTI ratio: 0.69  AORTA Ao Root diam: 2.60 cm MITRAL VALVE MV Area (PHT): 4.06 cm     SHUNTS MV Decel Time: 187 msec     Systemic VTI:  0.19 m MV E velocity: 113.00 cm/s  Systemic Diam: 1.95 cm  MV A velocity: 44.80 cm/s MV E/A ratio:  2.52 Armida Lander MD Electronically signed by Armida Lander MD Signature Date/Time: 12/17/2023/12:55:56 PM    Final    DG Chest Port 1 View Result Date: 12/16/2023 EXAM: 1 VIEW XRAY OF THE CHEST 12/16/2023 09:39:00 PM COMPARISON: 01/06/2023 CLINICAL HISTORY: Questionable sepsis - evaluate for abnormality. Patient from home BIB [facility] w/ complaints of weakness since Tuesday which he noticed after he started to come down w/ cold symptoms- reports chills, cold sweats, intermittent difficulty breathing, congestion, cough. Denies sick contacts. Usually is able to ambulate at home, but has been unable to. EMS reports patient was hypoxic on RA on arrival at 90% and placed patient on 3 L of O2 w/ improvement. Patient w/ hx of Afib and HR 80-110 for EMS. Patient febrile 103.1. hx of diabetes, hypertension. Former smoker. FINDINGS: LUNGS AND PLEURA: No focal pulmonary opacity. No pulmonary edema. No pleural effusion. No pneumothorax. HEART AND MEDIASTINUM: The heart is top normal in size. No acute abnormality of the mediastinal silhouette. BONES AND SOFT TISSUES: No acute osseous abnormality. IMPRESSION: 1. No acute findings. Electronically signed by: Zadie Herter MD 12/16/2023 09:43 PM EDT RP Workstation: [id number]    Microbiology: Results for orders placed or performed during the hospital encounter of 12/16/23  Blood Culture (routine x 2)     Status: None   Collection Time: 12/16/23  9:45 PM   Specimen: Left Antecubital; Blood  Result Value Ref Range Status   Specimen Description LEFT ANTECUBITAL  Final   Special Requests   Final    BOTTLES DRAWN AEROBIC AND ANAEROBIC Blood Culture adequate volume   Culture   Final    NO GROWTH 5 DAYS Performed at [facility], [address] [place], [place] [address]    Report Status 12/21/2023 FINAL  Final  Resp panel by RT-PCR (RSV, Flu A&B, Covid) Anterior Nasal Swab     Status: None   Collection Time: 12/16/23  9:47  PM   Specimen: Anterior Nasal Swab  Result Value Ref Range Status   SARS Coronavirus 2 by RT PCR NEGATIVE NEGATIVE Final    Comment: (NOTE) SARS-CoV-2 target nucleic acids are NOT DETECTED.  The SARS-CoV-2 RNA is generally detectable in upper respiratory specimens during the acute phase of infection. The lowest concentration of SARS-CoV-2 viral copies this assay can detect is 138 copies/mL. A negative result does not preclude SARS-Cov-2 infection and should not be used as the sole basis for treatment or other patient management decisions. A negative result may occur with  improper specimen collection/handling, submission of specimen other than nasopharyngeal swab, presence of viral mutation(s) within the areas targeted by this assay, and inadequate number of viral copies(<138 copies/mL). A negative result must be combined with clinical observations, patient history, and epidemiological information. The expected result is Negative.  Fact Sheet for Patients:  BloggerCourse.com  Fact Sheet for Healthcare Providers:  SeriousBroker.it  This test is no t yet approved or cleared by the [place]  FDA and  has been authorized for detection and/or diagnosis of SARS-CoV-2 by FDA under an Emergency Use Authorization (EUA). This EUA will remain  in effect (meaning this test can be used) for the duration of the COVID-19 declaration under Section 564(b)(1) of the Act, 21 U.S.C.section 360bbb-3(b)(1), unless the authorization is terminated  or revoked sooner.       Influenza A by PCR NEGATIVE NEGATIVE Final   Influenza B by PCR NEGATIVE NEGATIVE Final    Comment: (NOTE) The Xpert Xpress SARS-CoV-2/FLU/RSV plus assay is intended as an aid in the diagnosis of influenza from Nasopharyngeal swab specimens and should not be used as a sole basis for treatment. Nasal washings and aspirates are unacceptable for Xpert Xpress  SARS-CoV-2/FLU/RSV testing.  Fact Sheet for Patients: BloggerCourse.com  Fact Sheet for Healthcare Providers: SeriousBroker.it  This test is not yet approved or cleared by the [place]  FDA and has been authorized for detection and/or diagnosis of SARS-CoV-2 by FDA under an Emergency Use Authorization (EUA). This EUA will remain in effect (meaning this test can be used) for the duration of the COVID-19 declaration under Section 564(b)(1) of the Act, 21 U.S.C. section 360bbb-3(b)(1), unless the authorization is terminated or revoked.     Resp Syncytial Virus by PCR NEGATIVE NEGATIVE Final    Comment: (NOTE) Fact Sheet for Patients: BloggerCourse.com  Fact Sheet for Healthcare Providers: SeriousBroker.it  This test is not yet approved or cleared by the [place]  FDA and has been authorized for detection and/or diagnosis of SARS-CoV-2 by FDA under an Emergency Use Authorization (EUA). This EUA will remain in effect (meaning this test can be used) for the duration of the COVID-19 declaration under Section 564(b)(1) of the Act, 21 U.S.C. section 360bbb-3(b)(1), unless the authorization is terminated or revoked.  Performed at [facility], [address] [place], [place] [address]   Blood Culture (routine x 2)     Status: None   Collection Time: 12/16/23 11:28 PM   Specimen: BLOOD LEFT FOREARM  Result Value Ref Range Status   Specimen Description BLOOD LEFT FOREARM  Final   Special Requests   Final    BOTTLES DRAWN AEROBIC AND ANAEROBIC Blood Culture adequate volume   Culture   Final    NO GROWTH 5 DAYS Performed at [facility], [address] [place], [place] [address]    Report Status 12/21/2023 FINAL  Final  Urine Culture     Status: Abnormal   Collection Time: 12/17/23  2:50 AM   Specimen: Urine, Random  Result Value Ref Range Status   Specimen Description    Final    URINE, RANDOM Performed at [facility], [address] [place], [place] [address]    Special Requests   Final    NONE Reflexed from [id number] Performed at [facility], [address], [place], [place] [address]    Culture MULTIPLE SPECIES PRESENT, SUGGEST RECOLLECTION (A)  Final   Report Status 12/18/2023 FINAL  Final  Respiratory (~20 pathogens) panel by PCR     Status: None   Collection Time: 12/17/23  9:12 AM   Specimen: Nasopharyngeal Swab; Respiratory  Result Value Ref Range Status   Adenovirus NOT DETECTED NOT DETECTED Final   Coronavirus 229E NOT DETECTED NOT DETECTED Final    Comment: (NOTE) The Coronavirus on the Respiratory Panel, DOES NOT test for the novel  Coronavirus (2019 nCoV)    Coronavirus HKU1 NOT DETECTED NOT DETECTED Final   Coronavirus NL63 NOT DETECTED NOT DETECTED Final   Coronavirus OC43  NOT DETECTED NOT DETECTED Final   Metapneumovirus NOT DETECTED NOT DETECTED Final   Rhinovirus / Enterovirus NOT DETECTED NOT DETECTED Final   Influenza A NOT DETECTED NOT DETECTED Final   Influenza B NOT DETECTED NOT DETECTED Final   Parainfluenza Virus 1 NOT DETECTED NOT DETECTED Final   Parainfluenza Virus 2 NOT DETECTED NOT DETECTED Final   Parainfluenza Virus 3 NOT DETECTED NOT DETECTED Final   Parainfluenza Virus 4 NOT DETECTED NOT DETECTED Final   Respiratory Syncytial Virus NOT DETECTED NOT DETECTED Final   Bordetella pertussis NOT DETECTED NOT DETECTED Final   Bordetella Parapertussis NOT DETECTED NOT DETECTED Final   Chlamydophila pneumoniae NOT DETECTED NOT DETECTED Final   Mycoplasma pneumoniae NOT DETECTED NOT DETECTED Final    Comment: Performed at [facility], [address] [place], [place] [address]  MRSA Next Gen by PCR, Nasal     Status: None   Collection Time: 12/19/23 11:55 AM   Specimen: Nasal Mucosa; Nasal Swab  Result Value Ref Range Status   MRSA by PCR Next Gen NOT DETECTED NOT DETECTED Final    Comment: (NOTE) The GeneXpert  MRSA Assay (FDA approved for NASAL specimens only), is one component of a comprehensive MRSA colonization surveillance program. It is not intended to diagnose MRSA infection nor to guide or monitor treatment for MRSA infections. Test performance is not FDA approved in patients less than 90 years old. Performed at [facility], [address] [place], [place] [address]     Labs: CBC: Recent Labs  Lab 12/16/23 2147 12/17/23 0737 12/20/23 0446 12/21/23 0412 12/22/23 0509  WBC 7.4 7.0 4.6 5.9 6.9  NEUTROABS 6.5  --   --   --   --   HGB 11.0* 9.8* 9.6* 9.7* 9.6*  HCT 34.4* 31.9* 30.7* 32.0* 30.9*  MCV 78.5* 79.0* 78.5* 76.9* 79.4*  PLT 145* 137* 134* 188 219   Basic Metabolic Panel: Recent Labs  Lab 12/17/23 0737 12/18/23 0319 12/19/23 0520 12/20/23 0446 12/21/23 0412 12/22/23 0509  NA 133* 133* 130* 132* 137 140  K 3.8 3.4* 3.3* 3.6 3.7 3.7  CL 100 103 99 103 102 104  CO2 21* 21* 22 22 21* 25  GLUCOSE 173* 199* 167* 209* 233* 239*  BUN 42* 42* 48* 46* 39* 33*  CREATININE 1.92* 2.28* 2.44* 2.14* 1.83* 1.48*  CALCIUM 8.0* 8.0* 8.0* 8.1* 8.5* 8.4*  MG 1.8 1.8 1.9  --  2.0  --   PHOS 3.0  --   --  3.6  --   --    Liver Function Tests: Recent Labs  Lab 12/16/23 2147 12/17/23 0737 12/20/23 0446  AST 55* 56*  --   ALT 33 34  --   ALKPHOS 43 39  --   BILITOT 0.8 0.7  --   PROT 6.8 6.4*  --   ALBUMIN 3.1* 2.9* 2.4*   CBG: Recent Labs  Lab 12/21/23 0705 12/21/23 1020 12/21/23 1624 12/21/23 2033 12/22/23 0743  GLUCAP 241* 315* 319* 284* 265*    Discharge time spent: greater than 40 minutes.  Signed: Bobbetta Burnet, MD [facility] 12/22/2023

## 2023-12-26 LAB — UPEP/UIFE/LIGHT CHAINS/TP, 24-HR UR
% BETA, Urine: 22 %
ALPHA 1 URINE: 7.3 %
Albumin, U: 40.4 %
Alpha 2, Urine: 20.3 %
Free Kappa Lt Chains,Ur: 90.26 mg/L — ABNORMAL HIGH (ref 1.17–86.46)
Free Kappa/Lambda Ratio: 3.42 (ref 1.83–14.26)
Free Lambda Lt Chains,Ur: 26.37 mg/L — ABNORMAL HIGH (ref 0.27–15.21)
GAMMA GLOBULIN URINE: 10 %
Total Protein, Urine-Ur/day: 1073 mg/(24.h) — ABNORMAL HIGH (ref 30–150)
Total Protein, Urine: 26.5 mg/dL
Total Volume: 4050

## 2024-01-05 ENCOUNTER — Encounter: Payer: Self-pay | Admitting: Nurse Practitioner

## 2024-01-05 ENCOUNTER — Ambulatory Visit: Attending: Nurse Practitioner | Admitting: Nurse Practitioner

## 2024-01-05 VITALS — BP 112/72 | HR 68 | Ht 72.0 in

## 2024-01-05 DIAGNOSIS — G4733 Obstructive sleep apnea (adult) (pediatric): Secondary | ICD-10-CM

## 2024-01-05 DIAGNOSIS — E782 Mixed hyperlipidemia: Secondary | ICD-10-CM | POA: Diagnosis not present

## 2024-01-05 DIAGNOSIS — I4819 Other persistent atrial fibrillation: Secondary | ICD-10-CM | POA: Diagnosis not present

## 2024-01-05 DIAGNOSIS — Z794 Long term (current) use of insulin: Secondary | ICD-10-CM

## 2024-01-05 DIAGNOSIS — I5032 Chronic diastolic (congestive) heart failure: Secondary | ICD-10-CM

## 2024-01-05 DIAGNOSIS — E118 Type 2 diabetes mellitus with unspecified complications: Secondary | ICD-10-CM

## 2024-01-05 DIAGNOSIS — I10 Essential (primary) hypertension: Secondary | ICD-10-CM | POA: Diagnosis not present

## 2024-01-05 DIAGNOSIS — N1832 Chronic kidney disease, stage 3b: Secondary | ICD-10-CM

## 2024-01-05 DIAGNOSIS — E039 Hypothyroidism, unspecified: Secondary | ICD-10-CM

## 2024-01-05 NOTE — Patient Instructions (Signed)
 Medication Instructions:  Your physician recommends that you continue on your current medications as directed. Please refer to the Current Medication list given to you today.  *If you need a refill on your cardiac medications before your next appointment, please call your pharmacy*  Lab Work: None ordered If you have labs (blood work) drawn today and your tests are completely normal, you will receive your results only by: MyChart Message (if you have MyChart) OR A paper copy in the mail If you have any lab test that is abnormal or we need to change your treatment, we will call you to review the results.  Follow-Up: At [facility], you and your health needs are our priority.  As part of our continuing mission to provide you with exceptional heart care, our providers are all part of one team.  This team includes your primary Cardiologist (physician) and Advanced Practice Providers or APPs (Physician Assistants and Nurse Practitioners) who all work together to provide you with the care you need, when you need it.  Your next appointment:   6 month(s)  Provider:   Dr Alvis Ba  We recommend signing up for the patient portal called MyChart.  Sign up information is provided on this After Visit Summary.  MyChart is used to connect with patients for Virtual Visits (Telemedicine).  Patients are able to view lab/test results, encounter notes, upcoming appointments, etc.  Non-urgent messages can be sent to your provider as well.   To learn more about what you can do with MyChart, go to [email or web address].

## 2024-01-05 NOTE — Progress Notes (Unsigned)
 Office Visit    Patient Name: Evan Berry Date of Encounter: 6/202/2025  Primary Care Provider:  Shayne Anes, MD Primary Cardiologist:  Jerel Balding, MD  Chief Complaint    68 year old male with a history of chronic HFpEF, persistent atrial fibrillation, hypertension, hyperlipidemia, type 2 diabetes, CKD stage III, hypothyroidism, iron deficiency anemia, OSA, and GERD who presents for hospital follow-up related to heart failure.  Past Medical History    Past Medical History:  Diagnosis Date   Allergic rhinitis    Arthritis    Blood transfusion without reported diagnosis    Cataract    Chronic kidney disease    pt. denies   Diabetes (HCC)    type 2   Dyspnea on exertion    GERD (gastroesophageal reflux disease)    History of hiatal hernia    Hyperlipidemia    Hypertension    Hypothyroidism    Iron deficiency anemia    OSA (obstructive sleep apnea)    PUD (peptic ulcer disease)    Sleep apnea    WEARS CPAP   Past Surgical History:  Procedure Laterality Date   BACK SURGERY  11/2008   x2   bleeding ulcer surgery     CARDIOVERSION N/A 01/29/2020   Procedure: CARDIOVERSION;  Surgeon: Balding Jerel, MD;  Location: [facility];  Service: Cardiovascular;  Laterality: N/A;   CHOLECYSTECTOMY  1998   COLONOSCOPY     COLONOSCOPY WITH PROPOFOL  N/A 09/26/2022   Procedure: COLONOSCOPY WITH PROPOFOL ;  Surgeon: Aneita Gwendlyn DASEN, MD;  Location: [facility];  Service: Gastroenterology;  Laterality: N/A;   ESOPHAGOGASTRODUODENOSCOPY (EGD) WITH PROPOFOL  N/A 12/18/2017   Procedure: ESOPHAGOGASTRODUODENOSCOPY (EGD) WITH PROPOFOL ;  Surgeon: Aneita Gwendlyn DASEN, MD;  Location: [facility];  Service: Endoscopy;  Laterality: N/A;   ESOPHAGOGASTRODUODENOSCOPY (EGD) WITH PROPOFOL  N/A 09/26/2022   Procedure: ESOPHAGOGASTRODUODENOSCOPY (EGD) WITH PROPOFOL ;  Surgeon: Aneita Gwendlyn DASEN, MD;  Location: [facility];  Service: Gastroenterology;  Laterality: N/A;   GASTRIC BYPASS  1983    GASTRIC RESTRICTION SURGERY     POLYPECTOMY  12/18/2017   Procedure: POLYPECTOMY;  Surgeon: Aneita Gwendlyn DASEN, MD;  Location: [facility];  Service: Endoscopy;;   POLYPECTOMY  09/26/2022   Procedure: POLYPECTOMY;  Surgeon: Aneita Gwendlyn DASEN, MD;  Location: [facility];  Service: Gastroenterology;;    Allergies  Allergies  Allergen Reactions   Penicillins Other (See Comments)    Can't remember reaction, childhood allergy      Labs/Other Studies Reviewed    The following studies were reviewed today:  Cardiac Studies & Procedures   ______________________________________________________________________________________________     ECHOCARDIOGRAM  ECHOCARDIOGRAM COMPLETE 12/17/2023  Narrative ECHOCARDIOGRAM REPORT    Patient Name:   Evan Berry Date of Exam: 12/17/2023 Medical Rec #:  [id number]        Height:       72.0 in Accession #:    [id number]       Weight:       389.6 lb Date of Birth:  11-25-1955         BSA:          2.828 m Patient Age:    67 years         BP:           142/82 mmHg Patient Gender: M                HR:           117 bpm. Exam Location:  Outpatient  Procedure: 2D  Echo, Color Doppler, Cardiac Doppler and Intracardiac Opacification Agent (Both Spectral and Color Flow Doppler were utilized during procedure).  Indications:    CHF  History:        Patient has prior history of Echocardiogram examinations, most recent 08/17/2022. Arrythmias:Atrial Fibrillation; Risk Factors:Hypertension, Diabetes, Dyslipidemia and Former Smoker.  Sonographer:    Orvil Holmes RDCS Referring Phys: [phone] DAVID TAT  IMPRESSIONS   1. Left ventricular ejection fraction, by estimation, is 65 to 70%. The left ventricle has normal function. Left ventricular endocardial border not optimally defined to evaluate regional wall motion. There is mild left ventricular hypertrophy. Left ventricular diastolic parameters are indeterminate. 2. RV not well visualized, grossly appears  normal in size and function. . Right ventricular systolic function was not well visualized. The right ventricular size is not well visualized. Tricuspid regurgitation signal is inadequate for assessing PA pressure. 3. The mitral valve was not well visualized. Trivial mitral valve regurgitation. No evidence of mitral stenosis. 4. The aortic valve was not well visualized. Aortic valve regurgitation is not visualized. No aortic stenosis is present.  FINDINGS Left Ventricle: Left ventricular ejection fraction, by estimation, is 65 to 70%. The left ventricle has normal function. Left ventricular endocardial border not optimally defined to evaluate regional wall motion. Definity  contrast agent was given IV to delineate the left ventricular endocardial borders. The left ventricular internal cavity size was normal in size. There is mild left ventricular hypertrophy. Left ventricular diastolic parameters are indeterminate.  Right Ventricle: RV not well visualized, grossly appears normal in size and function. The right ventricular size is not well visualized. Right vetricular wall thickness was not well visualized. Right ventricular systolic function was not well visualized. Tricuspid regurgitation signal is inadequate for assessing PA pressure.  Left Atrium: Left atrial size was normal in size.  Right Atrium: Right atrial size was normal in size.  Pericardium: There is no evidence of pericardial effusion.  Mitral Valve: The mitral valve was not well visualized. Trivial mitral valve regurgitation. No evidence of mitral valve stenosis.  Tricuspid Valve: The tricuspid valve is not well visualized. Tricuspid valve regurgitation is not demonstrated. No evidence of tricuspid stenosis.  Aortic Valve: The aortic valve was not well visualized. Aortic valve regurgitation is not visualized. No aortic stenosis is present. Aortic valve mean gradient measures 7.0 mmHg. Aortic valve peak gradient measures 14.0 mmHg.  Aortic valve area, by VTI measures 2.06 cm.  Pulmonic Valve: The pulmonic valve was not well visualized. Pulmonic valve regurgitation is not visualized. No evidence of pulmonic stenosis.  Aorta: The aortic root is normal in size and structure and the ascending aorta was not well visualized.  Venous: Not well visualized.  IAS/Shunts: The interatrial septum was not well visualized.   LEFT VENTRICLE PLAX 2D LVIDd:         5.80 cm   Diastology LVIDs:         3.10 cm   LV e' medial:    8.38 cm/s LV PW:         1.20 cm   LV E/e' medial:  13.5 LV IVS:        1.20 cm   LV e' lateral:   9.79 cm/s LVOT diam:     1.95 cm   LV E/e' lateral: 11.5 LV SV:         57 LV SV Index:   20 LVOT Area:     2.99 cm   RIGHT VENTRICLE RV Basal diam:  5.30 cm RV Mid diam:  3.80 cm RV S prime:     22.90 cm/s TAPSE (M-mode): 2.5 cm  LEFT ATRIUM              Index        RIGHT ATRIUM           Index LA diam:        5.50 cm  1.94 cm/m   RA Area:     23.40 cm LA Vol (A2C):   56.7 ml  20.05 ml/m  RA Volume:   68.90 ml  24.36 ml/m LA Vol (A4C):   104.0 ml 36.77 ml/m LA Biplane Vol: 83.4 ml  29.49 ml/m AORTIC VALVE AV Area (Vmax):    1.98 cm AV Area (Vmean):   2.11 cm AV Area (VTI):     2.06 cm AV Vmax:           187.11 cm/s AV Vmean:          122.803 cm/s AV VTI:            0.276 m AV Peak Grad:      14.0 mmHg AV Mean Grad:      7.0 mmHg LVOT Vmax:         124.00 cm/s LVOT Vmean:        86.600 cm/s LVOT VTI:          0.190 m LVOT/AV VTI ratio: 0.69  AORTA Ao Root diam: 2.60 cm  MITRAL VALVE MV Area (PHT): 4.06 cm     SHUNTS MV Decel Time: 187 msec     Systemic VTI:  0.19 m MV E velocity: 113.00 cm/s  Systemic Diam: 1.95 cm MV A velocity: 44.80 cm/s MV E/A ratio:  2.52  Dorn Ross MD Electronically signed by Dorn Ross MD Signature Date/Time: 12/17/2023/12:55:56 PM    Final           ______________________________________________________________________________________________     Recent Labs: 12/17/2023: ALT 34; B Natriuretic Peptide 75.0 12/18/2023: TSH 0.861 12/21/2023: Magnesium 2.0 12/22/2023: BUN 33; Creatinine, Ser 1.48; Hemoglobin 9.6; Platelets 219; Potassium 3.7; Sodium 140  Recent Lipid Panel No results found for: CHOL, TRIG, HDL, CHOLHDL, VLDL, LDLCALC, LDLDIRECT  History of Present Illness    68 year old male with the above past medical history including chronic HFpEF, persistent atrial fibrillation, hypertension, hyperlipidemia, type 2 diabetes, CKD stage III, hypothyroidism, iron deficiency anemia, OSA, and GERD.  He has a history of paroxysmal atrial fibrillation, previously followed in A-fib clinic.  He underwent successful cardioversion in 2021, however he did have early recurrence of atrial fibrillation.  He has since been rate controlled, on metoprolol  and Eliquis .  He is followed by Dr. Shlomo for sleep apnea.  He was last seen in the office on 02/23/2023 by Dr. Shlomo. Echocardiogram in 2024 showed EF 60 to 65%, normal LV function, no RWMA, mild LVH, normal RV systolic function, mild mitral valve regurgitation.  He was hospitalized from 12/16/2023 to 12/22/2023 in the setting of sepsis due to unknown organism, SIRS, acute on chronic diastolic heart failure.  Cardiology was consulted.  Chest x-ray without acute process, BNP was normal.  He was treated for probable pneumonia, UTI.  He received IV Lasix , however this was ultimately discontinued in the setting of AKI.  Ramipril was held in the setting of AKI.  Echocardiogram during hospitalization in 12/2023 showed EF 65 to 70%, normal LV function, no RWMA, mild LVH, normal RV systolic function (overall poorly visualized), no significant valvular abnormalities. He was discharged home in stable  condition.    He presents today for follow-up. Since his last visit and since his recent hospitalization he has  been stable from a cardiac standpoint.  He reports stable dependent nonpitting bilateral lower extremity edema, he denies chest pain, palpitations, dizziness, dyspnea, PND, orthopnea, weight gain.  Overall, he reports feeling well.   Home Medications    Current Outpatient Medications  Medication Sig Dispense Refill   acetaminophen  (TYLENOL ) 650 MG CR tablet Take 1,950 mg by mouth at bedtime.     amLODipine  (NORVASC ) 5 MG tablet Take 5 mg by mouth daily.     apixaban  (ELIQUIS ) 5 MG TABS tablet Take 1 tablet (5 mg total) by mouth 2 (two) times daily. appt req for refill [id number] 60 tablet 0   Blood Glucose Monitoring Suppl DEVI 1 each by Does not apply route 3 (three) times daily. May dispense any manufacturer covered by patient's insurance. 1 each 0   Cyanocobalamin  (B-12) 2500 MCG TABS Take 2,500 mcg by mouth at bedtime.     ezetimibe  (ZETIA ) 10 MG tablet Take 10 mg by mouth daily.     glimepiride (AMARYL) 4 MG tablet Take 4 mg by mouth daily with breakfast.     Glucose Blood (BLOOD GLUCOSE TEST STRIPS) STRP 1 each by Does not apply route 3 (three) times daily. Use as directed to check blood sugar. May dispense any manufacturer covered by patient's insurance and fits patient's device. 100 strip 0   HYDROcodone -acetaminophen  (NORCO) 10-325 MG tablet Take 1 tablet by mouth every 6 (six) hours as needed for severe pain or moderate pain. 20 tablet 0   insulin  glargine-yfgn (SEMGLEE ) 100 UNIT/ML injection Inject 0.1 mLs (10 Units total) into the skin daily. 10 mL 11   Lancet Device MISC 1 each by Does not apply route 3 (three) times daily. May dispense any manufacturer covered by patient's insurance. 1 each 0   Lancets MISC 1 each by Does not apply route 3 (three) times daily. Use as directed to check blood sugar. May dispense any manufacturer covered by patient's insurance and fits patient's device. 100 each 0   levothyroxine  (SYNTHROID , LEVOTHROID) 150 MCG tablet Take 150-300 mcg by mouth See admin  instructions.  Take 300 mg at bedtime on Sunday. All the other days take 150 mg at bedtime     metoprolol  succinate (TOPROL -XL) 100 MG 24 hr tablet Take 1 tablet (100 mg total) by mouth 2 (two) times daily. 60 tablet 2   Multiple Vitamin (MULTIVITAMIN) capsule Take 1 capsule by mouth at bedtime.     omeprazole  (PRILOSEC) 20 MG capsule TAKE 1 CAPSULE (20 MG TOTAL) BY MOUTH 2 (TWO) TIMES DAILY BEFORE A MEAL. 180 capsule 3   rosuvastatin  (CRESTOR ) 20 MG tablet Take 20 mg by mouth daily.     No current facility-administered medications for this visit.     Review of Systems    He denies chest pain, palpitations, dyspnea, pnd, orthopnea, n, v, dizziness, syncope, edema, weight gain, or early satiety. All other systems reviewed and are otherwise negative except as noted above.   Physical Exam    VS:  BP 112/72   Pulse 68   Ht 6' (1.829 m)   SpO2 96%   BMI 51.16 kg/m   GEN: Well nourished, well developed, in no acute distress. HEENT: normal. Neck: Supple, no JVD, carotid bruits, or masses. Cardiac: RRR, no murmurs, rubs, or gallops. No clubbing, cyanosis, nonpitting bilateral lower extremity edema.  Radials/DP/PT 2+ and equal bilaterally.  Respiratory:  Respirations regular and unlabored, clear to auscultation bilaterally. GI: Soft, nontender, nondistended, BS + x 4. MS: no deformity or atrophy. Skin: warm and dry, no rash. Neuro:  Strength and sensation are intact. Psych: Normal affect.  Accessory Clinical Findings    ECG personally reviewed by me today -    - no EKG in office today   Lab Results  Component Value Date   WBC 6.9 12/22/2023   HGB 9.6 (L) 12/22/2023   HCT 30.9 (L) 12/22/2023   MCV 79.4 (L) 12/22/2023   PLT 219 12/22/2023   Lab Results  Component Value Date   CREATININE 1.48 (H) 12/22/2023   BUN 33 (H) 12/22/2023   NA 140 12/22/2023   K 3.7 12/22/2023   CL 104 12/22/2023   CO2 25 12/22/2023   Lab Results  Component Value Date   ALT 34 12/17/2023   AST 56  (H) 12/17/2023   ALKPHOS 39 12/17/2023   BILITOT 0.7 12/17/2023   No results found for: CHOL, HDL, LDLCALC, LDLDIRECT, TRIG, CHOLHDL  Lab Results  Component Value Date   HGBA1C 9.1 (H) 12/17/2023    Assessment & Plan    1. Chronic HFpEF: Echocardiogram during hospitalization in 12/2023 showed EF 65 to 70%, normal LV function, no RWMA, mild LVH, normal RV systolic function (overall poorly visualized), no significant valvular abnormalities.  He has stable mild dependent nonpitting bilateral lower extremity edema.  Generally euvolemic on exam well compensated on exam.  Continue metoprolol .  2. Persistent atrial fibrillation: Rate controlled.  Denies palpitations, denies bleeding. Continue metoprolol , Eliquis .  3. Hypertension: BP well controlled.  Ramipril was held in the setting of AKI.  Will defer resumption of ramipril at this time (he is pending follow-up with nephrology).  Continue metoprolol .   4. Hyperlipidemia: LDL was 26 in 04/2023.  Continue Crestor , Zetia .  5. CKD stage III/AKI: Creatinine was 1.48 on 12/22/2023.  Will repeat BMET.  Followed by nephrology.  6. Type 2 diabetes: A1c was 9.2 in 12/2023.  Monitored and managed per PCP.  7. Hypothyroidism: TSH was 0.861 in 12/2023.  On levothyroxine .  Managed per PCP.  8. OSA: Adherent to CPAP.   9. Disposition: Follow-up in 6 months. He would like to establish with Dr. Francyne (former pt of Dr. Alveta).    Damien JAYSON Braver, NP 01/07/2024, 1:54 PM

## 2024-01-07 ENCOUNTER — Encounter: Payer: Self-pay | Admitting: Nurse Practitioner
